# Patient Record
Sex: Male | Born: 1945 | Race: White | Hispanic: No | Marital: Single | Smoking: Never smoker
Health system: Southern US, Community
[De-identification: ages and names within clinical notes are randomized; demographics above are authoritative.]

## PROBLEM LIST (undated history)

## (undated) DIAGNOSIS — E785 Hyperlipidemia, unspecified: Secondary | ICD-10-CM

## (undated) DIAGNOSIS — I10 Essential (primary) hypertension: Secondary | ICD-10-CM

## (undated) DIAGNOSIS — R040 Epistaxis: Secondary | ICD-10-CM

## (undated) DIAGNOSIS — M1990 Unspecified osteoarthritis, unspecified site: Secondary | ICD-10-CM

## (undated) DIAGNOSIS — I499 Cardiac arrhythmia, unspecified: Secondary | ICD-10-CM

## (undated) DIAGNOSIS — I482 Chronic atrial fibrillation, unspecified: Secondary | ICD-10-CM

## (undated) DIAGNOSIS — D649 Anemia, unspecified: Secondary | ICD-10-CM

## (undated) HISTORY — DX: Chronic atrial fibrillation, unspecified: I48.20

## (undated) HISTORY — DX: Essential (primary) hypertension: I10

## (undated) HISTORY — PX: SHOULDER SURGERY: SHX246

## (undated) HISTORY — DX: Hyperlipidemia, unspecified: E78.5

## (undated) HISTORY — PX: CARPAL TUNNEL RELEASE: SHX101

## (undated) HISTORY — DX: Epistaxis: R04.0

---

## 1989-12-02 HISTORY — PX: ACOUSTIC NEUROMA RESECTION: SHX5713

## 2001-03-10 ENCOUNTER — Encounter (HOSPITAL_COMMUNITY): Admission: RE | Admit: 2001-03-10 | Discharge: 2001-04-09 | Payer: Self-pay | Admitting: Orthopedic Surgery

## 2003-01-18 ENCOUNTER — Ambulatory Visit (HOSPITAL_BASED_OUTPATIENT_CLINIC_OR_DEPARTMENT_OTHER): Admission: RE | Admit: 2003-01-18 | Discharge: 2003-01-18 | Payer: Self-pay | Admitting: Orthopedic Surgery

## 2003-03-10 ENCOUNTER — Ambulatory Visit (HOSPITAL_BASED_OUTPATIENT_CLINIC_OR_DEPARTMENT_OTHER): Admission: RE | Admit: 2003-03-10 | Discharge: 2003-03-10 | Payer: Self-pay | Admitting: Orthopedic Surgery

## 2004-03-20 ENCOUNTER — Ambulatory Visit (HOSPITAL_COMMUNITY): Admission: RE | Admit: 2004-03-20 | Discharge: 2004-03-20 | Payer: Self-pay | Admitting: *Deleted

## 2004-10-12 ENCOUNTER — Ambulatory Visit: Payer: Self-pay | Admitting: *Deleted

## 2004-11-08 ENCOUNTER — Encounter (HOSPITAL_COMMUNITY): Admission: RE | Admit: 2004-11-08 | Discharge: 2004-12-08 | Payer: Self-pay | Admitting: Orthopedic Surgery

## 2004-11-14 ENCOUNTER — Ambulatory Visit: Payer: Self-pay | Admitting: *Deleted

## 2004-11-21 ENCOUNTER — Ambulatory Visit: Payer: Self-pay | Admitting: Cardiology

## 2004-12-19 ENCOUNTER — Ambulatory Visit: Payer: Self-pay | Admitting: *Deleted

## 2005-01-16 ENCOUNTER — Ambulatory Visit: Payer: Self-pay | Admitting: *Deleted

## 2005-02-13 ENCOUNTER — Ambulatory Visit: Payer: Self-pay | Admitting: Cardiovascular Disease

## 2005-03-19 ENCOUNTER — Ambulatory Visit: Payer: Self-pay | Admitting: *Deleted

## 2005-04-17 ENCOUNTER — Ambulatory Visit: Payer: Self-pay | Admitting: *Deleted

## 2005-05-17 ENCOUNTER — Ambulatory Visit: Payer: Self-pay | Admitting: *Deleted

## 2005-06-26 ENCOUNTER — Ambulatory Visit: Payer: Self-pay | Admitting: *Deleted

## 2005-07-29 ENCOUNTER — Ambulatory Visit: Payer: Self-pay | Admitting: Cardiology

## 2005-08-26 ENCOUNTER — Ambulatory Visit: Payer: Self-pay | Admitting: Cardiology

## 2005-09-23 ENCOUNTER — Ambulatory Visit: Payer: Self-pay | Admitting: *Deleted

## 2005-10-28 ENCOUNTER — Ambulatory Visit: Payer: Self-pay | Admitting: *Deleted

## 2005-11-27 ENCOUNTER — Ambulatory Visit: Payer: Self-pay | Admitting: Cardiology

## 2005-12-30 ENCOUNTER — Ambulatory Visit: Payer: Self-pay | Admitting: *Deleted

## 2006-01-28 ENCOUNTER — Ambulatory Visit: Payer: Self-pay | Admitting: Cardiology

## 2006-02-27 ENCOUNTER — Ambulatory Visit: Payer: Self-pay | Admitting: *Deleted

## 2006-04-01 ENCOUNTER — Ambulatory Visit: Payer: Self-pay | Admitting: *Deleted

## 2006-04-30 ENCOUNTER — Ambulatory Visit: Payer: Self-pay | Admitting: *Deleted

## 2006-05-30 ENCOUNTER — Ambulatory Visit: Payer: Self-pay | Admitting: *Deleted

## 2006-06-30 ENCOUNTER — Ambulatory Visit: Payer: Self-pay | Admitting: *Deleted

## 2006-07-28 ENCOUNTER — Ambulatory Visit: Payer: Self-pay | Admitting: *Deleted

## 2006-09-01 ENCOUNTER — Ambulatory Visit: Payer: Self-pay | Admitting: Cardiology

## 2006-10-03 ENCOUNTER — Ambulatory Visit: Payer: Self-pay | Admitting: Cardiovascular Disease

## 2006-10-30 ENCOUNTER — Ambulatory Visit: Payer: Self-pay | Admitting: Cardiology

## 2006-11-26 ENCOUNTER — Ambulatory Visit: Payer: Self-pay | Admitting: Cardiology

## 2006-12-30 ENCOUNTER — Ambulatory Visit: Payer: Self-pay | Admitting: Cardiology

## 2007-02-02 ENCOUNTER — Ambulatory Visit: Payer: Self-pay | Admitting: Cardiology

## 2007-02-02 ENCOUNTER — Encounter (INDEPENDENT_AMBULATORY_CARE_PROVIDER_SITE_OTHER): Payer: Self-pay | Admitting: *Deleted

## 2007-02-02 LAB — CONVERTED CEMR LAB
Albumin: 4.3 g/dL
Alkaline Phosphatase: 132 units/L
BUN: 23 mg/dL
CO2: 27 meq/L
Calcium: 9.3 mg/dL
Creatinine, Ser: 0.97 mg/dL
Glucose, Bld: 92 mg/dL
Total Protein: 6.6 g/dL

## 2007-02-19 ENCOUNTER — Ambulatory Visit: Payer: Self-pay | Admitting: Internal Medicine

## 2007-03-27 ENCOUNTER — Ambulatory Visit: Payer: Self-pay | Admitting: Internal Medicine

## 2007-04-28 ENCOUNTER — Ambulatory Visit: Payer: Self-pay | Admitting: Cardiology

## 2007-06-01 ENCOUNTER — Ambulatory Visit: Payer: Self-pay | Admitting: Internal Medicine

## 2007-07-17 ENCOUNTER — Ambulatory Visit: Payer: Self-pay | Admitting: Internal Medicine

## 2007-08-14 ENCOUNTER — Ambulatory Visit: Payer: Self-pay | Admitting: Cardiology

## 2007-09-11 ENCOUNTER — Ambulatory Visit: Payer: Self-pay | Admitting: Internal Medicine

## 2007-10-12 ENCOUNTER — Ambulatory Visit: Payer: Self-pay | Admitting: Cardiology

## 2007-11-10 ENCOUNTER — Ambulatory Visit: Payer: Self-pay | Admitting: Cardiology

## 2007-12-04 ENCOUNTER — Ambulatory Visit: Payer: Self-pay | Admitting: Cardiology

## 2007-12-07 ENCOUNTER — Ambulatory Visit: Payer: Self-pay | Admitting: Cardiology

## 2008-01-04 ENCOUNTER — Ambulatory Visit: Payer: Self-pay | Admitting: Cardiology

## 2008-02-03 ENCOUNTER — Ambulatory Visit: Payer: Self-pay | Admitting: Cardiology

## 2008-03-04 ENCOUNTER — Ambulatory Visit: Payer: Self-pay | Admitting: Cardiology

## 2008-04-08 ENCOUNTER — Ambulatory Visit: Payer: Self-pay | Admitting: Internal Medicine

## 2008-05-09 ENCOUNTER — Ambulatory Visit: Payer: Self-pay | Admitting: Cardiology

## 2008-05-30 ENCOUNTER — Ambulatory Visit: Payer: Self-pay | Admitting: Internal Medicine

## 2008-06-27 ENCOUNTER — Ambulatory Visit: Payer: Self-pay | Admitting: Cardiology

## 2008-07-27 ENCOUNTER — Ambulatory Visit: Payer: Self-pay | Admitting: Cardiology

## 2008-08-17 ENCOUNTER — Ambulatory Visit: Payer: Self-pay | Admitting: Cardiology

## 2008-09-22 ENCOUNTER — Ambulatory Visit: Payer: Self-pay | Admitting: Cardiology

## 2008-10-24 ENCOUNTER — Ambulatory Visit: Payer: Self-pay | Admitting: Cardiology

## 2008-11-21 ENCOUNTER — Ambulatory Visit: Payer: Self-pay | Admitting: Cardiology

## 2008-12-19 ENCOUNTER — Ambulatory Visit: Payer: Self-pay | Admitting: Cardiology

## 2009-01-16 ENCOUNTER — Ambulatory Visit: Payer: Self-pay | Admitting: Cardiology

## 2009-02-13 ENCOUNTER — Ambulatory Visit: Payer: Self-pay | Admitting: Cardiology

## 2009-03-13 ENCOUNTER — Ambulatory Visit: Payer: Self-pay | Admitting: Cardiology

## 2009-04-17 ENCOUNTER — Ambulatory Visit: Payer: Self-pay | Admitting: Cardiology

## 2009-05-22 ENCOUNTER — Ambulatory Visit: Payer: Self-pay | Admitting: Cardiology

## 2009-06-22 ENCOUNTER — Ambulatory Visit: Payer: Self-pay | Admitting: Cardiology

## 2009-07-17 ENCOUNTER — Encounter: Payer: Self-pay | Admitting: *Deleted

## 2009-07-17 ENCOUNTER — Ambulatory Visit: Payer: Self-pay | Admitting: Cardiology

## 2009-07-17 LAB — CONVERTED CEMR LAB: POC INR: 3

## 2009-08-21 ENCOUNTER — Ambulatory Visit: Payer: Self-pay | Admitting: Cardiology

## 2009-09-18 ENCOUNTER — Ambulatory Visit: Payer: Self-pay | Admitting: Cardiology

## 2009-09-18 LAB — CONVERTED CEMR LAB: POC INR: 2.7

## 2009-10-05 ENCOUNTER — Encounter (INDEPENDENT_AMBULATORY_CARE_PROVIDER_SITE_OTHER): Payer: Self-pay

## 2009-10-16 ENCOUNTER — Ambulatory Visit: Payer: Self-pay | Admitting: Cardiology

## 2009-10-16 LAB — CONVERTED CEMR LAB: POC INR: 3.1

## 2009-11-23 ENCOUNTER — Ambulatory Visit: Payer: Self-pay | Admitting: Cardiology

## 2009-12-20 ENCOUNTER — Encounter (INDEPENDENT_AMBULATORY_CARE_PROVIDER_SITE_OTHER): Payer: Self-pay | Admitting: *Deleted

## 2009-12-26 ENCOUNTER — Ambulatory Visit: Payer: Self-pay | Admitting: Cardiology

## 2009-12-26 ENCOUNTER — Ambulatory Visit (HOSPITAL_COMMUNITY): Admission: RE | Admit: 2009-12-26 | Discharge: 2009-12-26 | Payer: Self-pay | Admitting: Cardiology

## 2009-12-26 ENCOUNTER — Encounter (INDEPENDENT_AMBULATORY_CARE_PROVIDER_SITE_OTHER): Payer: Self-pay | Admitting: *Deleted

## 2009-12-26 DIAGNOSIS — E039 Hypothyroidism, unspecified: Secondary | ICD-10-CM | POA: Insufficient documentation

## 2009-12-26 DIAGNOSIS — I4891 Unspecified atrial fibrillation: Secondary | ICD-10-CM | POA: Insufficient documentation

## 2009-12-26 DIAGNOSIS — E785 Hyperlipidemia, unspecified: Secondary | ICD-10-CM | POA: Insufficient documentation

## 2009-12-26 DIAGNOSIS — M25519 Pain in unspecified shoulder: Secondary | ICD-10-CM

## 2009-12-26 LAB — CONVERTED CEMR LAB: POC INR: 2.6

## 2009-12-27 ENCOUNTER — Encounter: Payer: Self-pay | Admitting: Cardiology

## 2009-12-27 LAB — CONVERTED CEMR LAB
ALT: 24 units/L (ref 0–53)
BUN: 23 mg/dL (ref 6–23)
Basophils Absolute: 0.1 10*3/uL (ref 0.0–0.1)
Basophils Relative: 1 % (ref 0–1)
CO2: 27 meq/L (ref 19–32)
Calcium: 9.2 mg/dL (ref 8.4–10.5)
Glucose, Bld: 100 mg/dL — ABNORMAL HIGH (ref 70–99)
HCT: 44.7 % (ref 39.0–52.0)
MCHC: 34 g/dL (ref 30.0–36.0)
MCV: 88.2 fL (ref 78.0–100.0)
Monocytes Relative: 10 % (ref 3–12)
Neutro Abs: 6.2 10*3/uL (ref 1.7–7.7)
Platelets: 280 10*3/uL (ref 150–400)
RDW: 13.6 % (ref 11.5–15.5)
Sodium: 142 meq/L (ref 135–145)
TSH: 7.55 microintl units/mL — ABNORMAL HIGH (ref 0.350–4.500)
Total Protein: 6.6 g/dL (ref 6.0–8.3)
Triglycerides: 102 mg/dL (ref <150)

## 2009-12-29 ENCOUNTER — Encounter (INDEPENDENT_AMBULATORY_CARE_PROVIDER_SITE_OTHER): Payer: Self-pay | Admitting: *Deleted

## 2009-12-29 LAB — CONVERTED CEMR LAB: OCCULT 1: NEGATIVE

## 2010-01-02 ENCOUNTER — Encounter (INDEPENDENT_AMBULATORY_CARE_PROVIDER_SITE_OTHER): Payer: Self-pay | Admitting: *Deleted

## 2010-01-04 ENCOUNTER — Encounter (INDEPENDENT_AMBULATORY_CARE_PROVIDER_SITE_OTHER): Payer: Self-pay | Admitting: *Deleted

## 2010-01-22 ENCOUNTER — Ambulatory Visit: Payer: Self-pay | Admitting: Cardiology

## 2010-01-22 LAB — CONVERTED CEMR LAB: POC INR: 2.7

## 2010-02-19 ENCOUNTER — Ambulatory Visit: Payer: Self-pay | Admitting: Cardiology

## 2010-02-19 LAB — CONVERTED CEMR LAB: POC INR: 2.4

## 2010-03-19 ENCOUNTER — Ambulatory Visit: Payer: Self-pay | Admitting: Cardiovascular Disease

## 2010-03-19 LAB — CONVERTED CEMR LAB: POC INR: 3

## 2010-04-16 ENCOUNTER — Ambulatory Visit: Payer: Self-pay | Admitting: Cardiology

## 2010-04-16 LAB — CONVERTED CEMR LAB: POC INR: 2

## 2010-05-14 ENCOUNTER — Ambulatory Visit: Payer: Self-pay | Admitting: Cardiology

## 2010-05-14 LAB — CONVERTED CEMR LAB: POC INR: 4.2

## 2010-05-21 ENCOUNTER — Ambulatory Visit: Payer: Self-pay | Admitting: Cardiovascular Disease

## 2010-06-06 ENCOUNTER — Ambulatory Visit: Payer: Self-pay | Admitting: Cardiovascular Disease

## 2010-06-06 LAB — CONVERTED CEMR LAB: POC INR: 2

## 2010-07-04 ENCOUNTER — Ambulatory Visit: Payer: Self-pay | Admitting: Cardiology

## 2010-08-01 ENCOUNTER — Ambulatory Visit: Payer: Self-pay | Admitting: Cardiology

## 2010-08-01 LAB — CONVERTED CEMR LAB: POC INR: 3.7

## 2010-08-22 ENCOUNTER — Ambulatory Visit: Payer: Self-pay | Admitting: Cardiology

## 2010-08-22 LAB — CONVERTED CEMR LAB: POC INR: 2.6

## 2010-09-18 ENCOUNTER — Ambulatory Visit: Payer: Self-pay | Admitting: Cardiology

## 2010-09-18 LAB — CONVERTED CEMR LAB: POC INR: 1.8

## 2010-10-01 ENCOUNTER — Ambulatory Visit: Payer: Self-pay | Admitting: Cardiology

## 2010-10-01 LAB — CONVERTED CEMR LAB: POC INR: 2.5

## 2010-10-29 ENCOUNTER — Ambulatory Visit: Payer: Self-pay | Admitting: Cardiology

## 2010-10-29 LAB — CONVERTED CEMR LAB: POC INR: 3

## 2010-11-21 ENCOUNTER — Ambulatory Visit: Payer: Self-pay | Admitting: Cardiology

## 2010-11-21 ENCOUNTER — Encounter: Payer: Self-pay | Admitting: Cardiology

## 2010-12-10 ENCOUNTER — Telehealth (INDEPENDENT_AMBULATORY_CARE_PROVIDER_SITE_OTHER): Payer: Self-pay | Admitting: *Deleted

## 2010-12-19 ENCOUNTER — Ambulatory Visit: Admit: 2010-12-19 | Payer: Self-pay

## 2011-01-01 NOTE — Medication Information (Signed)
Summary: 2 wk protime per checkout on 09/18/10/tg  Anticoagulant Therapy  Managed by: Vashti Hey, RN PCP: Wallace Going Supervising MD: Diona Browner MD, Remi Deter Indication 1: Atrial Fibrillation (ICD-427.31) Lab Used: [facility] [place] Site: [place] INR POC 2.5  Dietary changes: no    Health status changes: no    Bleeding/hemorrhagic complications: no    Recent/future hospitalizations: no    Any changes in medication regimen? no    Recent/future dental: no  Any missed doses?: no       Is patient compliant with meds? yes       Allergies: No Known Drug Allergies  Anticoagulation Management History:      The patient is taking warfarin and comes in today for a routine follow up visit.  Negative risk factors for bleeding include an age less than 77 years old.  The bleeding index is 'low risk'.  Positive CHADS2 values include History of HTN.  Negative CHADS2 values include Age > 1 years old.  The start date was 09/22/2000.  Anticoagulation responsible provider: Diona Browner MD, Remi Deter.  INR POC: 2.5.  Cuvette Lot#: [id number].  Exp: 10/2011.    Anticoagulation Management Assessment/Plan:      The patient's current anticoagulation dose is Warfarin sodium 5 mg tabs: Take 1 tablet by mouth once daily as directed by coumadin clinic.  The target INR is 2 - 3.  The next INR is due 10/29/2010.  Anticoagulation instructions were given to patient.  Results were reviewed/authorized by Vashti Hey, RN.  He was notified by Vashti Hey RN.         Prior Anticoagulation Instructions: take an extra 0.5 tomorrow then resume current dose of 1.5 tablets on Tuesdays, Thursdays, Saturdays and Sundays 1 tablet all other days  Current Anticoagulation Instructions: INR 2.5 Continue coumadin 7.5mg  once daily except 5mg  on Mondays, Wednesdays and Fridays

## 2011-01-01 NOTE — Medication Information (Signed)
Summary: ccr-lr  Anticoagulant Therapy  Managed by: Vashti Hey, RN PCP: Wallace Going Supervising MD: Dietrich Pates MD, Molly Maduro Indication 1: Atrial Fibrillation (ICD-427.31) Lab Used: [facility] [place] Site: [place] INR POC 4.2  Dietary changes: no    Health status changes: no    Bleeding/hemorrhagic complications: no    Recent/future hospitalizations: no    Any changes in medication regimen? no    Recent/future dental: no  Any missed doses?: no       Is patient compliant with meds? yes       Allergies: No Known Drug Allergies  Anticoagulation Management History:      The patient is taking warfarin and comes in today for a routine follow up visit.  Negative risk factors for bleeding include an age less than 68 years old.  The bleeding index is 'low risk'.  Positive CHADS2 values include History of HTN.  Negative CHADS2 values include Age > 19 years old.  The start date was 09/22/2000.  Anticoagulation responsible provider: Dietrich Pates MD, Molly Maduro.  INR POC: 4.2.  Cuvette Lot#: [id number].  Exp: 03/2011.    Anticoagulation Management Assessment/Plan:      The patient's current anticoagulation dose is Warfarin sodium 5 mg tabs: Take 1 tablet by mouth once daily as directed by coumadin clinic.  The target INR is 2 - 3.  The next INR is due 05/21/2010.  Anticoagulation instructions were given to patient.  Results were reviewed/authorized by Vashti Hey, RN.  He was notified by Vashti Hey RN.         Prior Anticoagulation Instructions: INR 2.0 Continue coumadin 7.5mg  once daily except 5mg  on Wednesdays  Current Anticoagulation Instructions: INR 4.2 Hold coumadin tonight, take 2.5mg  tomorrow night then resume 7.5mg  once daily except 5mg  on Wednesdays

## 2011-01-01 NOTE — Medication Information (Signed)
Summary: rov/sp  Anticoagulant Therapy  Managed by: Vashti Hey, RN PCP: Wallace Going Supervising MD: Eden Emms MD, Theron Arista Indication 1: Atrial Fibrillation (ICD-427.31) Lab Used: [facility] [place] Site: [place] INR POC 2.0  Dietary changes: no    Health status changes: no    Bleeding/hemorrhagic complications: no    Recent/future hospitalizations: no    Any changes in medication regimen? no    Recent/future dental: no  Any missed doses?: no       Is patient compliant with meds? yes       Allergies: No Known Drug Allergies  Anticoagulation Management History:      The patient is taking warfarin and comes in today for a routine follow up visit.  Negative risk factors for bleeding include an age less than 8 years old.  The bleeding index is 'low risk'.  Positive CHADS2 values include History of HTN.  Negative CHADS2 values include Age > 58 years old.  The start date was 09/22/2000.  Anticoagulation responsible provider: Eden Emms MD, Theron Arista.  INR POC: 2.0.  Cuvette Lot#: [id number].  Exp: 03/2011.    Anticoagulation Management Assessment/Plan:      The patient's current anticoagulation dose is Warfarin sodium 5 mg tabs: Take 1 tablet by mouth once daily as directed by coumadin clinic.  The target INR is 2 - 3.  The next INR is due 07/05/2010.  Anticoagulation instructions were given to patient.  Results were reviewed/authorized by Vashti Hey, RN.  He was notified by Vashti Hey RN.         Prior Anticoagulation Instructions: INR 2.4  Continue same dose of 1 1/2 tablets every day except 1 tablet on Wednesday.   Current Anticoagulation Instructions: INR 2.0 Take coumadin 1 1/2 tablets tonight then resume 1 1/2 tablets once daily except 1 tablet on Wednesdays

## 2011-01-01 NOTE — Medication Information (Signed)
Summary: ccr-lr  Anticoagulant Therapy  Managed by: Vashti Hey, RN PCP: Wallace Going Supervising MD: Dietrich Pates MD, Molly Maduro Indication 1: Atrial Fibrillation (ICD-427.31) Lab Used: [facility] [place] Site: [place] INR POC 3.7  Dietary changes: no    Health status changes: no    Bleeding/hemorrhagic complications: no    Recent/future hospitalizations: no    Any changes in medication regimen? no    Recent/future dental: no  Any missed doses?: no       Is patient compliant with meds? yes       Allergies: No Known Drug Allergies  Anticoagulation Management History:      The patient is taking warfarin and comes in today for a routine follow up visit.  Negative risk factors for bleeding include an age less than 7 years old.  The bleeding index is 'low risk'.  Positive CHADS2 values include History of HTN.  Negative CHADS2 values include Age > 73 years old.  The start date was 09/22/2000.  Anticoagulation responsible provider: Dietrich Pates MD, Molly Maduro.  INR POC: 3.7.  Cuvette Lot#: [id number].  Exp: 03/2011.    Anticoagulation Management Assessment/Plan:      The patient's current anticoagulation dose is Warfarin sodium 5 mg tabs: Take 1 tablet by mouth once daily as directed by coumadin clinic.  The target INR is 2 - 3.  The next INR is due 08/22/2010.  Anticoagulation instructions were given to patient.  Results were reviewed/authorized by Vashti Hey, RN.  He was notified by Vashti Hey RN.         Prior Anticoagulation Instructions: INR 3.5 Hold coumadin tonight then resume 7.5mg  once daily except 5mg  on Wednesdays  Current Anticoagulation Instructions: INR 3.7 Hold coumadin tonight then decrease dose to 7.5mg  once daily except 5mg  on Mondays, Wednesdays and Fridays

## 2011-01-01 NOTE — Letter (Signed)
Summary: Sidney Ace Future Lab Work Letter  [facility]     [place], [place]    Phone:   Fax:      December 26, 2009 MRN: [id number]   SPARSH CALLENS [address] [place], [place]  [address]      YOUR LAB WORK IS DUE   _____________THIS WEEK____________________________  Please go to [facility], located across the street from [facility] on the second floor.  Hours are Monday - Friday 7am until 7:30pm         Saturday 8am until 12noon    _X_  DO NOT EAT OR DRINK AFTER MIDNIGHT EVENING PRIOR TO LABWORK  __ YOUR LABWORK IS NOT FASTING --YOU MAY EAT PRIOR TO LABWORK

## 2011-01-01 NOTE — Letter (Signed)
Summary: [facility] Results Engineer, agricultural at [facility]  [address] [place] [address]   Phone: [phone]  Fax: [phone]      January 02, 2010 MRN: [id number]   William Sandoval [address] [place], [place]  [address]   Dear Mr. Davison,  Your test ordered by Selena Batten has been reviewed by your physician (or physician assistant) and was found to be normal or stable. Your physician (or physician assistant) felt no changes were needed at this time.  ____ Echocardiogram  ____ Cardiac Stress Test  __x__ Lab Work  ____ Peripheral vascular study of arms, legs or neck  __x__ CT scan or X-ray  ____ Lung or Breathing test  ____ Other: No change in medical treatment at this time, Per Dr. Dietrich Pates.   Thank you, Maxime Beckner Allyne Gee RN    Kern Bing, MD, Lenise Arena.C.Gaylord Shih, MD, F.A.C.C Lewayne Bunting, MD, F.A.C.C Nona Dell, MD, F.A.C.C Charlton Haws, MD, Lenise Arena.C.C

## 2011-01-01 NOTE — Medication Information (Signed)
Summary: ccr-lr  Anticoagulant Therapy  Managed by: Vashti Hey, RN PCP: Wallace Going Supervising MD: Dietrich Pates MD, Molly Maduro Indication 1: Atrial Fibrillation (ICD-427.31) Lab Used: [facility] [place] Site: [place] INR POC 2.4  Dietary changes: no    Health status changes: no    Bleeding/hemorrhagic complications: no    Recent/future hospitalizations: no    Any changes in medication regimen? no    Recent/future dental: no  Any missed doses?: no       Is patient compliant with meds? yes       Allergies: No Known Drug Allergies  Anticoagulation Management History:      The patient is taking warfarin and comes in today for a routine follow up visit.  Negative risk factors for bleeding include an age less than 1 years old.  The bleeding index is 'low risk'.  Positive CHADS2 values include History of HTN.  Negative CHADS2 values include Age > 4 years old.  The start date was 09/22/2000.  Anticoagulation responsible provider: Dietrich Pates MD, Molly Maduro.  INR POC: 2.4.  Cuvette Lot#: [id number].  Exp: 03/2011.    Anticoagulation Management Assessment/Plan:      The patient's current anticoagulation dose is Warfarin sodium 5 mg tabs: Take 1 tablet by mouth once daily as directed by coumadin clinic.  The target INR is 2 - 3.  The next INR is due 03/19/2010.  Anticoagulation instructions were given to patient.  Results were reviewed/authorized by Vashti Hey, RN.  He was notified by Vashti Hey RN.         Prior Anticoagulation Instructions: INR 2.7 Continue coumadin 7.5mg  once daily except 5mg  on Wednesdays  Current Anticoagulation Instructions: INR 2.4 Continue coumadin 7.5mg  once daily except 5mg  on Wednesdays

## 2011-01-01 NOTE — Miscellaneous (Signed)
Summary: labs stool cards  Clinical Lists Changes  Observations: Added new observation of HEMOCCULT 3: neg (12/29/2009 15:55) Added new observation of HEMOCCULT 2: neg (12/29/2009 15:55) Added new observation of HEMOCCULT 1: neg (12/29/2009 15:55)

## 2011-01-01 NOTE — Assessment & Plan Note (Signed)
Summary: past due for f/u/tg  Medications Added LEVOTHYROXINE SODIUM 25 MCG TABS (LEVOTHYROXINE SODIUM) take 1 tab daily NORVASC 5 MG TABS (AMLODIPINE BESYLATE) take 1 tab daily SIMVASTATIN 20 MG TABS (SIMVASTATIN) take 1 tab daily      Allergies Added: NKDA  Visit Type:  Follow-up Primary Provider:  Dr.Golding   History of Present Illness: [name] returns to the office for continued assessment and treatment of atrial fibrillation as well as cardiovascular risk factors in the absence of known coronary artery disease.  Since his last visit, he has done well from a medical standpoint.  No urgent care has been required.  His exercise tolerance is relatively good; he experiences no dyspnea nor chest discomfort.  He has significant social challenges in that he is unemployed and has limited funds.  His principle physical problem has been left shoulder pain for the past few months.  He recalls similar problems with his right shoulder some years ago at which time an orthopaedic surgical procedure was performed with good results by Dr. Rennis Chris.  He describes pain over the tip of the shoulder with certain movements or when he turns over in bed and puts pressure on the arm.  He notes no change in strength or sensation.  Current Medications (verified): 1)  Warfarin Sodium 5 Mg Tabs (Warfarin Sodium) .... Take 1 Tablet By Mouth Once Daily As Directed By Coumadin Clinic 2)  Levothyroxine Sodium 25 Mcg Tabs (Levothyroxine Sodium) .... Take 1 Tab Daily 3)  Norvasc 5 Mg Tabs (Amlodipine Besylate) .... Take 1 Tab Daily 4)  Simvastatin 20 Mg Tabs (Simvastatin) .... Take 1 Tab Daily  Allergies (verified): No Known Drug Allergies   Past History:  PMH, FH, and Social History reviewed and updated.  Past Medical History: Chronic atrial fibrillation; mild mitral valve prolapse on echo in 2001; negative stress nuclear in 1996 Hypertension Dyslipidemia Epistaxis  Past Surgical  History: Orthopaedic procedure on right shoulder  Family History: Father died at age 61 due to neoplastic disease Mother has a history of myocardial infarction One sister alive and well  Social History: Employment-loading dock; factory work Tobacco-none Alcohol-none  Vital Signs:  Patient profile:   65 year old male Height:      74 inches Weight:      243 pounds BMI:     31.31 Pulse rate:   88 / minute BP sitting:   140 / 93  (right arm)  Vitals Entered By: Dreama Saa, CNA (December 26, 2009 12:52 PM)  Physical Exam  General:   General-Well developed; no acute distress:   Neck-No JVD; no carotid bruits: Lungs-No tachypnea, no rales; no rhonchi; no wheezes: Cardiovascular-normal PMI; Split S1 and S2: Abdomen-BS normal; soft and non-tender without masses or organomegaly:  Musculoskeletal-No deformities, no cyanosis or clubbing; limitation of movement of left shoulder, worse with active abduction; no point tenderness Neurologic-Normal cranial nerves; symmetric strength and tone:  Skin-Warm, no significant lesions: Extremities-Nl distal pulses; 1+ pretibial edema:     Impression & Recommendations:  Problem # 1:  SHOULDER PAIN, LEFT (ICD-719.41) Patient's current symptoms are compatible with either arthritis of the left shoulder or cervical spine disease.  Lane films will be obtained, and the patient referred back to his orthopaedic physician, Dr. Rennis Chris.  Problem # 2:  ATRIAL FIBRILLATION (ICD-427.31) Control of heart rate is excellent.  Continuing anticoagulation is required.  To monitor this, a CBC and stool for Hemoccult testing will be obtained.  Problem # 3:  HYPOTHYROIDISM (ICD-244.9) Thyroid-stimulating  hormone level will be measured.  Problem # 4:  HYPERLIPIDEMIA (ICD-272.4) Fasting lipid profile and complete metabolic profile will be obtained.  Problem # 5:  HYPERTENSION (ICD-401.1) Blood pressure control is good; current prescriptions will be  renewed.  I will reassess this nice gentleman in one year.  Other Orders: Hemoccult Cards (Take Home) (Hemoccult Cards) T-Shoulder Left Min 2 Views ([id number]) T-Cervicle Spine 2-3 Views [id number]) Future Orders: T-Comprehensive Metabolic Panel ([id number]) ... 12/27/2009 T-CBC w/Diff ([id number]) ... 12/27/2009 T-Lipid Profile [id number]) ... 12/27/2009 T-TSH [id number]) ... 12/27/2009  Patient Instructions: 1)  Your physician recommends that you schedule a follow-up appointment in: 1 YEAR 2)  Your physician recommends that you return for lab work in: THIS WEEK 3)  You have been referred to [facility] FOR LEFT SHOULDER PAIN 4)  Your physician has asked that you test your stool for blood. It is necessary to test 3 different stool specimens for accuracy. You will be given 3 hemoccult cards for specimen collection. For each stool specimen, place a small portion of stool sample (from 2 different areas of the stool) into the 2 squares on the card. Close card. Repeat with 2 more stool specimens. Bring the cards back to the office for testing. 5)  LEFT SHOULDER AND CERVICAL SPINE X RAYS TODAY

## 2011-01-01 NOTE — Medication Information (Signed)
Summary: ccr-lr  Anticoagulant Therapy  Managed by: Vashti Hey, RN PCP: Wallace Going Supervising MD: Diona Browner MD, Remi Deter Indication 1: Atrial Fibrillation (ICD-427.31) Lab Used: [facility] [place] Site: [place] INR POC 2.6  Dietary changes: no    Health status changes: no    Bleeding/hemorrhagic complications: no    Recent/future hospitalizations: no    Any changes in medication regimen? no    Recent/future dental: no  Any missed doses?: no       Is patient compliant with meds? yes       Allergies: No Known Drug Allergies  Anticoagulation Management History:      The patient is taking warfarin and comes in today for a routine follow up visit.  Negative risk factors for bleeding include an age less than 53 years old.  The bleeding index is 'low risk'.  Positive CHADS2 values include History of HTN.  Negative CHADS2 values include Age > 41 years old.  The start date was 09/22/2000.  Anticoagulation responsible provider: Diona Browner MD, Remi Deter.  INR POC: 2.6.  Cuvette Lot#: [id number].  Exp: 03/2011.    Anticoagulation Management Assessment/Plan:      The patient's current anticoagulation dose is Warfarin sodium 5 mg tabs: Take 1 tablet by mouth once daily as directed by coumadin clinic.  The target INR is 2 - 3.  The next INR is due 09/19/2010.  Anticoagulation instructions were given to patient.  Results were reviewed/authorized by Vashti Hey, RN.  He was notified by Vashti Hey RN.         Prior Anticoagulation Instructions: INR 3.7 Hold coumadin tonight then decrease dose to 7.5mg  once daily except 5mg  on Mondays, Wednesdays and Fridays  Current Anticoagulation Instructions: INR 2.6 Continue coumadin 7.5mg  once daily except 5mg  on Mondays, Wednesdays and Fridays

## 2011-01-01 NOTE — Miscellaneous (Signed)
Summary: LABS CMP,02/02/2007  Clinical Lists Changes  Observations: Added new observation of CALCIUM: 9.3 mg/dL (78/29/5621 30:86) Added new observation of ALBUMIN: 4.3 g/dL (57/84/6962 95:28) Added new observation of PROTEIN, TOT: 6.6 g/dL (41/32/4401 02:72) Added new observation of SGPT (ALT): 29 units/L (02/02/2007 15:55) Added new observation of SGOT (AST): 26 units/L (02/02/2007 15:55) Added new observation of ALK PHOS: 132 units/L (02/02/2007 15:55) Added new observation of CREATININE: 0.97 mg/dL (53/66/4403 47:42) Added new observation of BUN: 23 mg/dL (59/56/3875 64:33) Added new observation of BG RANDOM: 92 mg/dL (29/51/8841 66:06) Added new observation of CO2 PLSM/SER: 27 meq/L (02/02/2007 15:55) Added new observation of CL SERUM: 106 meq/L (02/02/2007 15:55) Added new observation of K SERUM: 4.7 meq/L (02/02/2007 15:55) Added new observation of NA: 143 meq/L (02/02/2007 15:55)

## 2011-01-01 NOTE — Medication Information (Signed)
Summary: ccr-lr  Anticoagulant Therapy  Managed by: Vashti Hey, RN PCP: Wallace Going Supervising MD: Diona Browner MD, Remi Deter Indication 1: Atrial Fibrillation (ICD-427.31) Lab Used: [facility] [place] Site: [place] INR POC 3.0  Dietary changes: no    Health status changes: no    Bleeding/hemorrhagic complications: no    Recent/future hospitalizations: no    Any changes in medication regimen? no    Recent/future dental: no  Any missed doses?: no       Is patient compliant with meds? yes       Allergies: No Known Drug Allergies  Anticoagulation Management History:      The patient is taking warfarin and comes in today for a routine follow up visit.  Negative risk factors for bleeding include an age less than 70 years old.  The bleeding index is 'low risk'.  Positive CHADS2 values include History of HTN.  Negative CHADS2 values include Age > 16 years old.  The start date was 09/22/2000.  Anticoagulation responsible provider: Diona Browner MD, Remi Deter.  INR POC: 3.0.  Cuvette Lot#: [id number].  Exp: 10/2011.    Anticoagulation Management Assessment/Plan:      The patient's current anticoagulation dose is Warfarin sodium 5 mg tabs: Take 1 tablet by mouth once daily as directed by coumadin clinic.  The target INR is 2 - 3.  The next INR is due 11/21/2010.  Anticoagulation instructions were given to patient.  Results were reviewed/authorized by Vashti Hey, RN.  He was notified by Vashti Hey RN.         Prior Anticoagulation Instructions: INR 2.5 Continue coumadin 7.5mg  once daily except 5mg  on Mondays, Wednesdays and Fridays  Current Anticoagulation Instructions: INR 3.0 Continue coumadin 7.5mg  once daily except 5mg  on Mondays, Wednesdays and Fridays

## 2011-01-01 NOTE — Medication Information (Signed)
Summary: ccr-lr  Anticoagulant Therapy  Managed by: Vashti Hey, RN PCP: Wallace Going Supervising MD: Diona Browner MD, Remi Deter Indication 1: Atrial Fibrillation (ICD-427.31) Lab Used: [facility] [place] Site: [place] INR POC 3.5  Dietary changes: no    Health status changes: no    Bleeding/hemorrhagic complications: no    Recent/future hospitalizations: no    Any changes in medication regimen? no    Recent/future dental: no  Any missed doses?: no       Is patient compliant with meds? yes       Allergies: No Known Drug Allergies  Anticoagulation Management History:      The patient is taking warfarin and comes in today for a routine follow up visit.  Negative risk factors for bleeding include an age less than 24 years old.  The bleeding index is 'low risk'.  Positive CHADS2 values include History of HTN.  Negative CHADS2 values include Age > 54 years old.  The start date was 09/22/2000.  Anticoagulation responsible provider: Diona Browner MD, Remi Deter.  INR POC: 3.5.  Cuvette Lot#: [id number].  Exp: 03/2011.    Anticoagulation Management Assessment/Plan:      The patient's current anticoagulation dose is Warfarin sodium 5 mg tabs: Take 1 tablet by mouth once daily as directed by coumadin clinic.  The target INR is 2 - 3.  The next INR is due 08/01/2010.  Anticoagulation instructions were given to patient.  Results were reviewed/authorized by Vashti Hey, RN.  He was notified by Vashti Hey RN.         Prior Anticoagulation Instructions: INR 2.0 Take coumadin 1 1/2 tablets tonight then resume 1 1/2 tablets once daily except 1 tablet on Wednesdays  Current Anticoagulation Instructions: INR 3.5 Hold coumadin tonight then resume 7.5mg  once daily except 5mg  on Wednesdays

## 2011-01-01 NOTE — Medication Information (Signed)
Summary: ccr-lr      Allergies Added: NKDA Anticoagulant Therapy  Managed by: Teressa Lower, RN PCP: Wallace Going Supervising MD: Diona Browner MD, Remi Deter Indication 1: Atrial Fibrillation (ICD-427.31) Lab Used: [facility] [place] Site: [place] INR POC 1.8  Dietary changes: no    Health status changes: no    Bleeding/hemorrhagic complications: no    Recent/future hospitalizations: no    Any changes in medication regimen? no    Recent/future dental: no  Any missed doses?: yes     Details: missed 1 dose and ate extra salads this week  Is patient compliant with meds? yes       Current Medications (verified): 1)  Warfarin Sodium 5 Mg Tabs (Warfarin Sodium) .... Take 1 Tablet By Mouth Once Daily As Directed By Coumadin Clinic 2)  Levothyroxine Sodium 25 Mcg Tabs (Levothyroxine Sodium) .... Take 1 Tab Daily 3)  Norvasc 5 Mg Tabs (Amlodipine Besylate) .... Take 1 Tab Daily 4)  Simvastatin 20 Mg Tabs (Simvastatin) .... Take 1 Tab Daily  Allergies (verified): No Known Drug Allergies  Anticoagulation Management History:      The patient is taking warfarin and comes in today for a routine follow up visit.  Negative risk factors for bleeding include an age less than 40 years old.  The bleeding index is 'low risk'.  Positive CHADS2 values include History of HTN.  Negative CHADS2 values include Age > 36 years old.  The start date was 09/22/2000.  Anticoagulation responsible provider: Diona Browner MD, Remi Deter.  INR POC: 1.8.  Cuvette Lot#: [id number].  Exp: 10/2011.    Anticoagulation Management Assessment/Plan:      The patient's current anticoagulation dose is Warfarin sodium 5 mg tabs: Take 1 tablet by mouth once daily as directed by coumadin clinic.  The target INR is 2 - 3.  The next INR is due 10/01/2010.  Anticoagulation instructions were given to patient.  Results were reviewed/authorized by Teressa Lower, RN.  He was notified by Teressa Lower RN.           Prior Anticoagulation Instructions: INR 2.6 Continue coumadin 7.5mg  once daily except 5mg  on Mondays, Wednesdays and Fridays  Current Anticoagulation Instructions: take an extra 0.5 tomorrow then resume current dose of 1.5 tablets on Tuesdays, Thursdays, Saturdays and Sundays 1 tablet all other days

## 2011-01-01 NOTE — Medication Information (Signed)
Summary: [facility]  Anticoagulant Therapy  Managed by: Vashti Hey, RN PCP: Wallace Going Supervising MD: Dietrich Pates MD, Molly Maduro Indication 1: Atrial Fibrillation (ICD-427.31) Lab Used: [facility] [place] Site: [place] INR POC 2.7  Dietary changes: no    Health status changes: no    Bleeding/hemorrhagic complications: no    Recent/future hospitalizations: no    Any changes in medication regimen? no    Recent/future dental: no  Any missed doses?: no       Is patient compliant with meds? yes       Allergies: No Known Drug Allergies  Anticoagulation Management History:      The patient is taking warfarin and comes in today for a routine follow up visit.  Negative risk factors for bleeding include an age less than 28 years old.  The bleeding index is 'low risk'.  Positive CHADS2 values include History of HTN.  Negative CHADS2 values include Age > 80 years old.  The start date was 09/22/2000.  Anticoagulation responsible provider: Dietrich Pates MD, Molly Maduro.  INR POC: 2.7.  Cuvette Lot#: [id number].  Exp: 03/2011.    Anticoagulation Management Assessment/Plan:      The patient's current anticoagulation dose is Warfarin sodium 5 mg tabs: Take 1 tablet by mouth once daily as directed by coumadin clinic.  The target INR is 2 - 3.  The next INR is due 02/19/2010.  Anticoagulation instructions were given to patient.  Results were reviewed/authorized by Vashti Hey, RN.  He was notified by Vashti Hey RN.         Prior Anticoagulation Instructions: INR 2.6 CONTINUE CURRENT REGIMEN 1 TABLET ON THURSDAYS AND 1.5 TABLETS ALL OTHER DAYS  Current Anticoagulation Instructions: INR 2.7 Continue coumadin 7.5mg  once daily except 5mg  on Wednesdays

## 2011-01-01 NOTE — Medication Information (Signed)
Summary: ccr-lr   Anticoagulant Therapy  Managed by: Teressa Lower, RN Supervising MD: Dietrich Pates MD, Molly Maduro Indication 1: Atrial Fibrillation (ICD-427.31) Lab Used: [facility] [place] Site: [place] INR POC 2.6  Dietary changes: no    Health status changes: no    Bleeding/hemorrhagic complications: no    Recent/future hospitalizations: no    Any changes in medication regimen? no    Recent/future dental: no  Any missed doses?: no       Is patient compliant with meds? yes       Anticoagulation Management History:      The patient is taking warfarin and comes in today for a routine follow up visit.  Negative risk factors for bleeding include an age less than 70 years old.  The bleeding index is 'low risk'.  Negative CHADS2 values include Age > 61 years old.  The start date was 09/22/2000.  Anticoagulation responsible provider: Dietrich Pates MD, Molly Maduro.  INR POC: 2.6.  Cuvette Lot#: [id number].  Exp: 03/2011.    Anticoagulation Management Assessment/Plan:      The patient's current anticoagulation dose is Warfarin sodium 5 mg tabs: Take 1 tablet by mouth once daily as directed by coumadin clinic.  The target INR is 2 - 3.  The next INR is due 01/22/2010.  Anticoagulation instructions were given to patient.  Results were reviewed/authorized by Teressa Lower, RN.  He was notified by Teressa Lower RN.         Prior Anticoagulation Instructions: INR 1.9 Take coumadin 2 1/2 tablets tonight then resume 1 1/2 tablets once daily except 1 tablets on Wednesdays  Current Anticoagulation Instructions: INR 2.6 CONTINUE CURRENT REGIMEN 1 TABLET ON THURSDAYS AND 1.5 TABLETS ALL OTHER DAYS

## 2011-01-01 NOTE — Medication Information (Signed)
Summary: ccr-lr  Anticoagulant Therapy  Managed by: Vashti Hey, RN PCP: Wallace Going Supervising MD: Eden Emms MD, Theron Arista Indication 1: Atrial Fibrillation (ICD-427.31) Lab Used: [facility]  Site: [place] INR POC 3.0  Dietary changes: no    Health status changes: no    Bleeding/hemorrhagic complications: no    Recent/future hospitalizations: no    Any changes in medication regimen? no    Recent/future dental: no  Any missed doses?: no       Is patient compliant with meds? yes       Allergies: No Known Drug Allergies  Anticoagulation Management History:      The patient is taking warfarin and comes in today for a routine follow up visit.  Negative risk factors for bleeding include an age less than 13 years old.  The bleeding index is 'low risk'.  Positive CHADS2 values include History of HTN.  Negative CHADS2 values include Age > 24 years old.  The start date was 09/22/2000.  Anticoagulation responsible provider: Eden Emms MD, Theron Arista.  INR POC: 3.0.  Cuvette Lot#: [id number].  Exp: 03/2011.    Anticoagulation Management Assessment/Plan:      The patient's current anticoagulation dose is Warfarin sodium 5 mg tabs: Take 1 tablet by mouth once daily as directed by coumadin clinic.  The target INR is 2 - 3.  The next INR is due 04/16/2010.  Anticoagulation instructions were given to patient.  Results were reviewed/authorized by Vashti Hey, RN.  He was notified by Vashti Hey RN.         Prior Anticoagulation Instructions: INR 2.4 Continue coumadin 7.5mg  once daily except 5mg  on Wednesdays  Current Anticoagulation Instructions: INR 3.0 Continue coumadin 7.5mg  once daily except 5mg  on Wednesdays

## 2011-01-01 NOTE — Medication Information (Signed)
Summary: ccr-lr  Anticoagulant Therapy  Managed by: Vashti Hey, RN PCP: Wallace Going Supervising MD: Dietrich Pates MD, Molly Maduro Indication 1: Atrial Fibrillation (ICD-427.31) Lab Used: [facility] [place] Site: [place] INR POC 2.0  Dietary changes: no    Health status changes: no    Bleeding/hemorrhagic complications: no    Recent/future hospitalizations: no    Any changes in medication regimen? no    Recent/future dental: no  Any missed doses?: no       Is patient compliant with meds? yes       Allergies: No Known Drug Allergies  Anticoagulation Management History:      The patient is taking warfarin and comes in today for a routine follow up visit.  Negative risk factors for bleeding include an age less than 10 years old.  The bleeding index is 'low risk'.  Positive CHADS2 values include History of HTN.  Negative CHADS2 values include Age > 47 years old.  The start date was 09/22/2000.  Anticoagulation responsible provider: Dietrich Pates MD, Molly Maduro.  INR POC: 2.0.  Cuvette Lot#: [id number].  Exp: 03/2011.    Anticoagulation Management Assessment/Plan:      The patient's current anticoagulation dose is Warfarin sodium 5 mg tabs: Take 1 tablet by mouth once daily as directed by coumadin clinic.  The target INR is 2 - 3.  The next INR is due 05/14/2010.  Anticoagulation instructions were given to patient.  Results were reviewed/authorized by Vashti Hey, RN.  He was notified by Vashti Hey RN.         Prior Anticoagulation Instructions: INR 3.0 Continue coumadin 7.5mg  once daily except 5mg  on Wednesdays  Current Anticoagulation Instructions: INR 2.0 Continue coumadin 7.5mg  once daily except 5mg  on Wednesdays

## 2011-01-01 NOTE — Medication Information (Signed)
Summary: ccr-lr  Anticoagulant Therapy  Managed by: Weston Brass, PharmD PCP: Wallace Going Supervising MD: Eden Emms MD, Theron Arista Indication 1: Atrial Fibrillation (ICD-427.31) Lab Used: [facility] [place] Site: [place] INR POC 2.4  Dietary changes: no    Health status changes: no    Bleeding/hemorrhagic complications: no    Recent/future hospitalizations: no    Any changes in medication regimen? no    Recent/future dental: no  Any missed doses?: no       Is patient compliant with meds? yes       Allergies: No Known Drug Allergies  Anticoagulation Management History:      The patient is taking warfarin and comes in today for a routine follow up visit.  Negative risk factors for bleeding include an age less than 31 years old.  The bleeding index is 'low risk'.  Positive CHADS2 values include History of HTN.  Negative CHADS2 values include Age > 28 years old.  The start date was 09/22/2000.  Anticoagulation responsible provider: Eden Emms MD, Theron Arista.  INR POC: 2.4.  Cuvette Lot#: [id number].  Exp: 03/2011.    Anticoagulation Management Assessment/Plan:      The patient's current anticoagulation dose is Warfarin sodium 5 mg tabs: Take 1 tablet by mouth once daily as directed by coumadin clinic.  The target INR is 2 - 3.  The next INR is due 06/06/2010.  Anticoagulation instructions were given to patient.  Results were reviewed/authorized by Weston Brass, PharmD.  He was notified by Weston Brass PharmD.         Prior Anticoagulation Instructions: INR 4.2 Hold coumadin tonight, take 2.5mg  tomorrow night then resume 7.5mg  once daily except 5mg  on Wednesdays  Current Anticoagulation Instructions: INR 2.4  Continue same dose of 1 1/2 tablets every day except 1 tablet on Wednesday.

## 2011-01-03 NOTE — Medication Information (Signed)
Summary: Coumadin Clinic  Anticoagulant Therapy  Managed by: Vashti Hey, RN PCP: Wallace Going Supervising MD: Dietrich Pates MD, Molly Maduro Indication 1: Atrial Fibrillation (ICD-427.31) Lab Used: [facility] [place] Site: [place] INR POC 2.9  Dietary changes: no    Health status changes: no    Bleeding/hemorrhagic complications: no    Recent/future hospitalizations: no    Any changes in medication regimen? no    Recent/future dental: no  Any missed doses?: no       Is patient compliant with meds? yes       Allergies: No Known Drug Allergies  Anticoagulation Management History:      The patient is taking warfarin and comes in today for a routine follow up visit.  Negative risk factors for bleeding include an age less than 26 years old.  The bleeding index is 'low risk'.  Positive CHADS2 values include History of HTN.  Negative CHADS2 values include Age > 50 years old.  The start date was 09/22/2000.  Anticoagulation responsible provider: Dietrich Pates MD, Molly Maduro.  INR POC: 2.9.  Cuvette Lot#: [id number].  Exp: 10/2011.    Anticoagulation Management Assessment/Plan:      The patient's current anticoagulation dose is Warfarin sodium 5 mg tabs: Take 1 tablet by mouth once daily as directed by coumadin clinic.  The target INR is 2 - 3.  The next INR is due 12/19/2010.  Anticoagulation instructions were given to patient.  Results were reviewed/authorized by Vashti Hey, RN.  He was notified by Vashti Hey RN.         Prior Anticoagulation Instructions: INR 3.0 Continue coumadin 7.5mg  once daily except 5mg  on Mondays, Wednesdays and Fridays  Current Anticoagulation Instructions: INR 2.9 Continue coumadin 7.5mg  once daily except 5mg  on Mondays, Wednesdays and Fridays

## 2011-01-10 ENCOUNTER — Encounter: Payer: Self-pay | Admitting: Cardiology

## 2011-01-14 ENCOUNTER — Telehealth (INDEPENDENT_AMBULATORY_CARE_PROVIDER_SITE_OTHER): Payer: Self-pay | Admitting: *Deleted

## 2011-01-17 NOTE — Progress Notes (Signed)
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 HE STATES HE CANNOT AFFORD TO PAY FOR COUMADIN VISITS AND WILL CALL TO R/S WHEN HE IS ABLE. Initial call taken by: Faythe Ghee,  December 10, 2010 2:48 PM  Follow-up for Phone Call        Pt no longer has the ability to pay co pay for inr check, called his PCP Dr.s Fusco's office and pt is to go to [place] this week for an inr check, this message has been sent to Vashti Hey to inactivate pt in [facility] clinic, 1 month supply of warfarin called in for this pt and all further refills to be sent to PCP Follow-up by: Teressa Lower RN,  January 08, 2011 3:29 PM

## 2011-01-17 NOTE — Medication Information (Signed)
Summary: Coumadin Clinic  Anticoagulant Therapy  Managed by: Inactive PCP: Dr.Golding Supervising MD: Dietrich Pates MD, Molly Maduro Indication 1: Atrial Fibrillation (ICD-427.31) Lab Used: [facility] [place] Site: [place]          Comments: Pt is transfering coumadin management to Dr Sherwood Gambler due to co-pay.  Allergies: No Known Drug Allergies  Anticoagulation Management History:      Negative risk factors for bleeding include an age less than 45 years old.  The bleeding index is 'low risk'.  Positive CHADS2 values include History of HTN.  Negative CHADS2 values include Age > 39 years old.  The start date was 09/22/2000.  Anticoagulation responsible provider: Dietrich Pates MD, Molly Maduro.  Exp: 10/2011.    Anticoagulation Management Assessment/Plan:      The patient's current anticoagulation dose is Warfarin sodium 5 mg tabs: Take 1 tablet by mouth once daily as directed by coumadin clinic.  The target INR is 2 - 3.  The next INR is due 12/19/2010.  Anticoagulation instructions were given to patient.  Results were reviewed/authorized by Inactive.         Prior Anticoagulation Instructions: INR 2.9 Continue coumadin 7.5mg  once daily except 5mg  on Mondays, Wednesdays and Fridays

## 2011-01-23 NOTE — Progress Notes (Signed)
Summary: Coumadin referal to Dr Sherwood Gambler  Phone Note Other Incoming   Summary of Call: Pt request coumadin to be managed by Dr Sherwood Gambler due to co-pay. Referal to Dr Sherwood Gambler for coumadin management. See attached INR records and dosage. Referal given to pt to take to Dr Fusco's office Initial call taken by: Vashti Hey RN,  January 14, 2011 3:52 PM

## 2011-03-14 ENCOUNTER — Encounter: Payer: Self-pay | Admitting: Cardiology

## 2011-03-14 DIAGNOSIS — I4891 Unspecified atrial fibrillation: Secondary | ICD-10-CM

## 2011-03-14 DIAGNOSIS — Z7901 Long term (current) use of anticoagulants: Secondary | ICD-10-CM | POA: Insufficient documentation

## 2011-04-19 NOTE — Op Note (Signed)
   William Sandoval, William Sandoval                           ACCOUNT NO.:  [id number]   MEDICAL RECORD NO.:  [id number]                   PATIENT TYPE:  AMB   LOCATION:  DSC                                  FACILITY:  [facility]   PHYSICIAN:  Katy Fitch. Naaman Plummer., M.D.          DATE OF BIRTH:  [birth year]   DATE OF PROCEDURE:  03/10/2003  DATE OF DISCHARGE:                                 OPERATIVE REPORT   PREOPERATIVE DIAGNOSIS:  Entrapment neuropathy median nerve, left carpal  tunnel.   POSTOPERATIVE DIAGNOSIS:  Entrapment neuropathy median nerve, left carpal  tunnel.   OPERATION:  Release of left transverse carpal ligament.   SURGEON:  Katy Fitch. Sypher, M.D.   ASSISTANT:  Jonni Sanger, P.A.   ANESTHESIA:  General by LMA.   ANESTHESIOLOGIST:  Burna Forts, M.D.   INDICATIONS FOR PROCEDURE:  Octaviano Mukai is a 65 year old man who was  referred for evaluation and management of bilateral hand numbness and  discomfort.  Clinical examination revealed signs of bilateral carpal tunnel  syndrome.  Electrodiagnostic studies completed in November 2003 documented  bilateral carpal tunnel syndrome.  Due to a failure to respond to  nonoperative measures, he is brought to the operating room at this time for  release of his left transverse carpal ligament.   PROCEDURE:  Crixus Mcaulay is brought to the operating room and placed on  supine position on the operating table.  Following induction of general  anesthesia by LMA, the left arm was prepped with Betadine solution and  sterilely draped.  Following exsanguination of the left arm with an Esmarch  bandage, an arterial tourniquet on the proximal brachium was inflated to 220  mmHg.  The procedure commenced with a short incision in the line of the ring  finger in the palm.  The subcutaneous tissues were carefully divided  revealing the palmar fascia.  The common sensory branches of the median  nerve were followed back to the transverse carpal  ligament where the median  nerve was carefully isolated from the ligament.  The ligament was then  released with scissors along the ulnar border extending into the distal  forearm.  This widely opened the carpal canal.  No masses were noted.  Bleeding points along the margins of the released ligament were  electrocauterized with bipolar current followed by repair of the skin with  intradermal 3-0 Prolene suture.  A compressive dressing was applied with a  volar plaster splint with the wrist in 5 degrees dorsiflexion.                                               Katy Fitch Naaman Plummer., M.D.    RVS/MEDQ  D:  03/10/2003  T:  03/10/2003  Job:  [id number]

## 2011-04-19 NOTE — Letter (Signed)
February 02, 2007    Madelin Rear. Sherwood Gambler, MD  [address]  [place], [place] [address]   RE:  William, Sandoval  MRN:  [id number]  /  DOB:  1946-06-09   Dear William Sandoval:   William Sandoval returns to the office for continuing assessment of chronic  atrial fibrillation requiring anticoagulation, hypertension, and  dyslipidemia.  Since last year, he has done superbly.  He previously  experienced a few episodes of epistaxis, but these resolved.  He has not  had any cardiopulmonary symptoms nor required urgent medical care.   MEDICATIONS:  His only medications include:  1. Warfarin as directed with stable and therapeutic anticoagulation.  2. Caduet 5/10 mg daily.   Lipid profile last year was excellent.  Blood pressure control has been  acceptable.   PHYSICAL EXAMINATION:  GENERAL:  Pleasant gentleman in no acute  distress.  VITAL SIGNS: The weight is 204, 8 pounds more than last year.  Heart  rate 68 and irregular, respirations 18, blood pressure 125/85.  NECK:  No jugular venous distention; normal carotid upstrokes without  bruits.  LUNGS:  Clear.  CARDIAC:  Normal first and second heart sounds; modest systolic murmur.  ABDOMEN:  Soft and nontender; no bruits; no masses.  EXTREMITIES:  No edema.   IMPRESSION:  William Sandoval is doing generally well.  We will continue to  manage Warfarin therapy.  A CBC, metabolic profile, and stools for  hemoccult testing will be obtained.  If results are good, I will plan to  see this nice gentleman for a formal office visit again in one year.    Sincerely,      Gerrit Friends. Dietrich Pates, MD, [facility]  Electronically Signed    RMR/MedQ  DD: 02/02/2007  DT: 02/02/2007  Job #: [id number]

## 2011-05-10 ENCOUNTER — Ambulatory Visit: Payer: Self-pay | Admitting: *Deleted

## 2011-11-27 ENCOUNTER — Encounter: Payer: Self-pay | Admitting: Cardiology

## 2011-11-27 DIAGNOSIS — R040 Epistaxis: Secondary | ICD-10-CM | POA: Insufficient documentation

## 2014-12-20 DIAGNOSIS — J209 Acute bronchitis, unspecified: Secondary | ICD-10-CM | POA: Diagnosis not present

## 2014-12-22 DIAGNOSIS — Z7901 Long term (current) use of anticoagulants: Secondary | ICD-10-CM | POA: Diagnosis not present

## 2015-01-11 ENCOUNTER — Ambulatory Visit (HOSPITAL_COMMUNITY)
Admission: RE | Admit: 2015-01-11 | Discharge: 2015-01-11 | Disposition: A | Discharge status: Home / Self Care | Payer: Commercial Managed Care - HMO | Source: Ambulatory Visit | Attending: Internal Medicine | Admitting: Internal Medicine

## 2015-01-11 ENCOUNTER — Other Ambulatory Visit (HOSPITAL_COMMUNITY): Payer: Self-pay | Admitting: Internal Medicine

## 2015-01-11 DIAGNOSIS — Z7901 Long term (current) use of anticoagulants: Secondary | ICD-10-CM | POA: Diagnosis not present

## 2015-01-11 DIAGNOSIS — I482 Chronic atrial fibrillation: Secondary | ICD-10-CM | POA: Diagnosis not present

## 2015-01-11 DIAGNOSIS — I517 Cardiomegaly: Secondary | ICD-10-CM | POA: Diagnosis not present

## 2015-01-11 DIAGNOSIS — L409 Psoriasis, unspecified: Secondary | ICD-10-CM | POA: Diagnosis not present

## 2015-01-11 DIAGNOSIS — R0602 Shortness of breath: Secondary | ICD-10-CM | POA: Diagnosis not present

## 2015-01-11 DIAGNOSIS — J209 Acute bronchitis, unspecified: Secondary | ICD-10-CM | POA: Diagnosis not present

## 2015-01-27 DIAGNOSIS — I482 Chronic atrial fibrillation: Secondary | ICD-10-CM | POA: Diagnosis not present

## 2015-01-27 DIAGNOSIS — Z7901 Long term (current) use of anticoagulants: Secondary | ICD-10-CM | POA: Diagnosis not present

## 2015-01-30 DIAGNOSIS — L98429 Non-pressure chronic ulcer of back with unspecified severity: Secondary | ICD-10-CM | POA: Diagnosis not present

## 2015-01-30 DIAGNOSIS — L0100 Impetigo, unspecified: Secondary | ICD-10-CM | POA: Diagnosis not present

## 2015-01-30 DIAGNOSIS — L309 Dermatitis, unspecified: Secondary | ICD-10-CM | POA: Diagnosis not present

## 2015-01-30 DIAGNOSIS — D485 Neoplasm of uncertain behavior of skin: Secondary | ICD-10-CM | POA: Diagnosis not present

## 2015-02-06 DIAGNOSIS — B86 Scabies: Secondary | ICD-10-CM | POA: Diagnosis not present

## 2015-02-06 DIAGNOSIS — L309 Dermatitis, unspecified: Secondary | ICD-10-CM | POA: Diagnosis not present

## 2015-02-06 DIAGNOSIS — Z79899 Other long term (current) drug therapy: Secondary | ICD-10-CM | POA: Diagnosis not present

## 2015-02-06 DIAGNOSIS — R5383 Other fatigue: Secondary | ICD-10-CM | POA: Diagnosis not present

## 2015-02-06 DIAGNOSIS — L299 Pruritus, unspecified: Secondary | ICD-10-CM | POA: Diagnosis not present

## 2015-02-20 DIAGNOSIS — L309 Dermatitis, unspecified: Secondary | ICD-10-CM | POA: Diagnosis not present

## 2015-02-27 DIAGNOSIS — L309 Dermatitis, unspecified: Secondary | ICD-10-CM | POA: Diagnosis not present

## 2015-02-27 DIAGNOSIS — E782 Mixed hyperlipidemia: Secondary | ICD-10-CM | POA: Diagnosis not present

## 2015-02-27 DIAGNOSIS — I482 Chronic atrial fibrillation: Secondary | ICD-10-CM | POA: Diagnosis not present

## 2015-02-27 DIAGNOSIS — I10 Essential (primary) hypertension: Secondary | ICD-10-CM | POA: Diagnosis not present

## 2015-03-14 DIAGNOSIS — I482 Chronic atrial fibrillation: Secondary | ICD-10-CM | POA: Diagnosis not present

## 2015-03-14 DIAGNOSIS — Z7901 Long term (current) use of anticoagulants: Secondary | ICD-10-CM | POA: Diagnosis not present

## 2015-03-28 DIAGNOSIS — Z7901 Long term (current) use of anticoagulants: Secondary | ICD-10-CM | POA: Diagnosis not present

## 2015-03-28 DIAGNOSIS — I482 Chronic atrial fibrillation: Secondary | ICD-10-CM | POA: Diagnosis not present

## 2015-03-28 DIAGNOSIS — E039 Hypothyroidism, unspecified: Secondary | ICD-10-CM | POA: Diagnosis not present

## 2015-04-03 DIAGNOSIS — I482 Chronic atrial fibrillation: Secondary | ICD-10-CM | POA: Diagnosis not present

## 2015-04-03 DIAGNOSIS — Z7901 Long term (current) use of anticoagulants: Secondary | ICD-10-CM | POA: Diagnosis not present

## 2015-04-25 DIAGNOSIS — I482 Chronic atrial fibrillation: Secondary | ICD-10-CM | POA: Diagnosis not present

## 2015-04-25 DIAGNOSIS — Z7901 Long term (current) use of anticoagulants: Secondary | ICD-10-CM | POA: Diagnosis not present

## 2015-04-25 DIAGNOSIS — L309 Dermatitis, unspecified: Secondary | ICD-10-CM | POA: Diagnosis not present

## 2015-04-28 DIAGNOSIS — I482 Chronic atrial fibrillation: Secondary | ICD-10-CM | POA: Diagnosis not present

## 2015-04-28 DIAGNOSIS — Z7901 Long term (current) use of anticoagulants: Secondary | ICD-10-CM | POA: Diagnosis not present

## 2015-05-09 DIAGNOSIS — I482 Chronic atrial fibrillation: Secondary | ICD-10-CM | POA: Diagnosis not present

## 2015-05-30 DIAGNOSIS — L989 Disorder of the skin and subcutaneous tissue, unspecified: Secondary | ICD-10-CM | POA: Diagnosis not present

## 2015-05-30 DIAGNOSIS — L0100 Impetigo, unspecified: Secondary | ICD-10-CM | POA: Diagnosis not present

## 2015-05-30 DIAGNOSIS — D485 Neoplasm of uncertain behavior of skin: Secondary | ICD-10-CM | POA: Diagnosis not present

## 2015-05-30 DIAGNOSIS — L309 Dermatitis, unspecified: Secondary | ICD-10-CM | POA: Diagnosis not present

## 2015-06-06 DIAGNOSIS — I482 Chronic atrial fibrillation: Secondary | ICD-10-CM | POA: Diagnosis not present

## 2015-06-07 DIAGNOSIS — L0100 Impetigo, unspecified: Secondary | ICD-10-CM | POA: Diagnosis not present

## 2015-06-07 DIAGNOSIS — L309 Dermatitis, unspecified: Secondary | ICD-10-CM | POA: Diagnosis not present

## 2015-06-22 DIAGNOSIS — L309 Dermatitis, unspecified: Secondary | ICD-10-CM | POA: Diagnosis not present

## 2015-06-22 DIAGNOSIS — L0100 Impetigo, unspecified: Secondary | ICD-10-CM | POA: Diagnosis not present

## 2015-07-12 DIAGNOSIS — I10 Essential (primary) hypertension: Secondary | ICD-10-CM | POA: Diagnosis not present

## 2015-07-12 DIAGNOSIS — L239 Allergic contact dermatitis, unspecified cause: Secondary | ICD-10-CM | POA: Diagnosis not present

## 2015-07-13 DIAGNOSIS — E782 Mixed hyperlipidemia: Secondary | ICD-10-CM | POA: Diagnosis not present

## 2015-07-13 DIAGNOSIS — I10 Essential (primary) hypertension: Secondary | ICD-10-CM | POA: Diagnosis not present

## 2015-07-13 DIAGNOSIS — L259 Unspecified contact dermatitis, unspecified cause: Secondary | ICD-10-CM | POA: Diagnosis not present

## 2015-07-21 DIAGNOSIS — Z7901 Long term (current) use of anticoagulants: Secondary | ICD-10-CM | POA: Diagnosis not present

## 2015-07-21 DIAGNOSIS — I10 Essential (primary) hypertension: Secondary | ICD-10-CM | POA: Diagnosis not present

## 2015-07-21 DIAGNOSIS — L309 Dermatitis, unspecified: Secondary | ICD-10-CM | POA: Diagnosis not present

## 2015-07-21 DIAGNOSIS — E782 Mixed hyperlipidemia: Secondary | ICD-10-CM | POA: Diagnosis not present

## 2015-07-21 DIAGNOSIS — I482 Chronic atrial fibrillation: Secondary | ICD-10-CM | POA: Diagnosis not present

## 2015-08-08 DIAGNOSIS — L259 Unspecified contact dermatitis, unspecified cause: Secondary | ICD-10-CM | POA: Diagnosis not present

## 2015-08-08 DIAGNOSIS — J309 Allergic rhinitis, unspecified: Secondary | ICD-10-CM | POA: Diagnosis not present

## 2015-08-08 DIAGNOSIS — R05 Cough: Secondary | ICD-10-CM | POA: Diagnosis not present

## 2015-08-15 ENCOUNTER — Other Ambulatory Visit (HOSPITAL_COMMUNITY): Payer: Self-pay | Admitting: Internal Medicine

## 2015-08-15 ENCOUNTER — Ambulatory Visit (HOSPITAL_COMMUNITY)
Admission: RE | Admit: 2015-08-15 | Discharge: 2015-08-15 | Disposition: A | Discharge status: Home / Self Care | Payer: Commercial Managed Care - HMO | Source: Ambulatory Visit | Attending: Internal Medicine | Admitting: Internal Medicine

## 2015-08-15 DIAGNOSIS — R1032 Left lower quadrant pain: Secondary | ICD-10-CM | POA: Insufficient documentation

## 2015-08-15 DIAGNOSIS — R110 Nausea: Secondary | ICD-10-CM

## 2015-08-15 DIAGNOSIS — R109 Unspecified abdominal pain: Secondary | ICD-10-CM | POA: Diagnosis not present

## 2015-08-26 ENCOUNTER — Encounter (HOSPITAL_COMMUNITY): Payer: Self-pay | Admitting: *Deleted

## 2015-08-26 ENCOUNTER — Emergency Department (HOSPITAL_COMMUNITY)
Admission: EM | Admit: 2015-08-26 | Discharge: 2015-08-26 | Disposition: A | Discharge status: Home / Self Care | Payer: Commercial Managed Care - HMO | Attending: Emergency Medicine | Admitting: Emergency Medicine

## 2015-08-26 DIAGNOSIS — Z7951 Long term (current) use of inhaled steroids: Secondary | ICD-10-CM | POA: Insufficient documentation

## 2015-08-26 DIAGNOSIS — Z79899 Other long term (current) drug therapy: Secondary | ICD-10-CM | POA: Diagnosis not present

## 2015-08-26 DIAGNOSIS — I482 Chronic atrial fibrillation: Secondary | ICD-10-CM | POA: Insufficient documentation

## 2015-08-26 DIAGNOSIS — R21 Rash and other nonspecific skin eruption: Secondary | ICD-10-CM | POA: Diagnosis present

## 2015-08-26 DIAGNOSIS — I10 Essential (primary) hypertension: Secondary | ICD-10-CM | POA: Diagnosis not present

## 2015-08-26 DIAGNOSIS — E785 Hyperlipidemia, unspecified: Secondary | ICD-10-CM | POA: Insufficient documentation

## 2015-08-26 DIAGNOSIS — L309 Dermatitis, unspecified: Secondary | ICD-10-CM | POA: Diagnosis not present

## 2015-08-26 DIAGNOSIS — Z7952 Long term (current) use of systemic steroids: Secondary | ICD-10-CM | POA: Insufficient documentation

## 2015-08-26 DIAGNOSIS — Z7901 Long term (current) use of anticoagulants: Secondary | ICD-10-CM | POA: Insufficient documentation

## 2015-08-26 LAB — CBC WITH DIFFERENTIAL/PLATELET
Basophils Absolute: 0 10*3/uL (ref 0.0–0.1)
Basophils Relative: 1 %
Eosinophils Absolute: 0.8 10*3/uL — ABNORMAL HIGH (ref 0.0–0.7)
Eosinophils Relative: 9 %
HEMATOCRIT: 40.8 % (ref 39.0–52.0)
HEMOGLOBIN: 13.8 g/dL (ref 13.0–17.0)
LYMPHS ABS: 1 10*3/uL (ref 0.7–4.0)
LYMPHS PCT: 12 %
MCH: 29.9 pg (ref 26.0–34.0)
MCHC: 33.8 g/dL (ref 30.0–36.0)
MCV: 88.5 fL (ref 78.0–100.0)
MONOS PCT: 13 %
Monocytes Absolute: 1.1 10*3/uL — ABNORMAL HIGH (ref 0.1–1.0)
NEUTROS ABS: 5.9 10*3/uL (ref 1.7–7.7)
NEUTROS PCT: 65 %
Platelets: 287 10*3/uL (ref 150–400)
RBC: 4.61 MIL/uL (ref 4.22–5.81)
RDW: 13.4 % (ref 11.5–15.5)
WBC: 8.8 10*3/uL (ref 4.0–10.5)

## 2015-08-26 LAB — COMPREHENSIVE METABOLIC PANEL
ALBUMIN: 3.5 g/dL (ref 3.5–5.0)
ALK PHOS: 119 U/L (ref 38–126)
ALT: 38 U/L (ref 17–63)
ANION GAP: 7 (ref 5–15)
AST: 38 U/L (ref 15–41)
BILIRUBIN TOTAL: 0.5 mg/dL (ref 0.3–1.2)
BUN: 18 mg/dL (ref 6–20)
CALCIUM: 8.1 mg/dL — AB (ref 8.9–10.3)
CO2: 27 mmol/L (ref 22–32)
Chloride: 104 mmol/L (ref 101–111)
Creatinine, Ser: 1.12 mg/dL (ref 0.61–1.24)
GLUCOSE: 104 mg/dL — AB (ref 65–99)
Potassium: 3.1 mmol/L — ABNORMAL LOW (ref 3.5–5.1)
Sodium: 138 mmol/L (ref 135–145)
TOTAL PROTEIN: 6.1 g/dL — AB (ref 6.5–8.1)

## 2015-08-26 LAB — SEDIMENTATION RATE: SED RATE: 5 mm/h (ref 0–16)

## 2015-08-26 MED ORDER — POTASSIUM CHLORIDE CRYS ER 20 MEQ PO TBCR
40.0000 meq | EXTENDED_RELEASE_TABLET | Freq: Once | ORAL | Status: AC
  Administered 2015-08-26: 40 meq via ORAL
  Filled 2015-08-26: qty 2

## 2015-08-26 MED ORDER — CEPHALEXIN 500 MG PO CAPS: 500.0000 mg | ORAL_CAPSULE | Freq: Four times a day (QID) | ORAL | Status: DC

## 2015-08-26 MED ORDER — METHYLPREDNISOLONE SODIUM SUCC 125 MG IJ SOLR
125.0000 mg | Freq: Once | INTRAMUSCULAR | Status: AC
  Administered 2015-08-26: 125 mg via INTRAVENOUS
  Filled 2015-08-26: qty 2

## 2015-08-26 MED ORDER — SODIUM CHLORIDE 0.9 % IV BOLUS (SEPSIS)
500.0000 mL | Freq: Once | INTRAVENOUS | Status: AC
  Administered 2015-08-26: 500 mL via INTRAVENOUS

## 2015-08-26 MED ORDER — DIPHENHYDRAMINE HCL 50 MG/ML IJ SOLN
25.0000 mg | Freq: Once | INTRAMUSCULAR | Status: AC
  Administered 2015-08-26: 25 mg via INTRAVENOUS
  Filled 2015-08-26: qty 1

## 2015-08-26 MED ORDER — FAMOTIDINE IN NACL 20-0.9 MG/50ML-% IV SOLN
20.0000 mg | Freq: Once | INTRAVENOUS | Status: AC
  Administered 2015-08-26: 20 mg via INTRAVENOUS
  Filled 2015-08-26: qty 50

## 2015-08-26 MED ORDER — PREDNISONE 50 MG PO TABS: ORAL_TABLET | ORAL | Status: DC

## 2015-08-26 NOTE — ED Notes (Signed)
MD at bedside.Patient states he is itching really bad. Scratching at this time.

## 2015-08-26 NOTE — Discharge Instructions (Signed)
Prescription for prednisone and antibiotic. Continue all your other medications. Follow-up with the dermatologist. Phone number given.

## 2015-08-26 NOTE — ED Notes (Addendum)
Rash all over x 6 mo. Pt has been seen by PCP and dermatologist. Rash from neck down to mid thigh, per pt.  Rash is described as stinging, burning pain with itching at times.Pt states he has also been sob a lot with wheezing and headaches lately also.

## 2015-08-26 NOTE — ED Provider Notes (Signed)
CSN: [id number]     Arrival date & time 08/26/15  1722 History   First MD Initiated Contact with Patient 08/26/15 1736     Chief Complaint  Patient presents with  . Rash     (Consider location/radiation/quality/duration/timing/severity/associated sxs/prior Treatment) HPI..... Patient complains of a pruritic generalized body rash for 6 months. He has tried steroids, antibiotic's, antipuretic medications with minimal relief. He has been seen by his primary care doctor and a dermatologist. No fever, sweats, chills. Rash is burning and stinging.  Past Medical History  Diagnosis Date  . Chronic atrial fibrillation   . Hypertension   . Dyslipidemia   . Epistaxis    Past Surgical History  Procedure Laterality Date  . Shoulder surgery      Right   Family History  Problem Relation Age of Onset  . Heart attack Mother    Social History  Substance Use Topics  . Smoking status: Unknown If Ever Smoked  . Smokeless tobacco: None  . Alcohol Use: No    Review of Systems  All other systems reviewed and are negative.     Allergies  Gluten meal; Shrimp; and Sertraline  Home Medications   Prior to Admission medications   Medication Sig Start Date End Date Taking? Authorizing Provider  Fluocinolone Acetonide 0.01 % OIL Apply 1 application topically 2 (two) times daily as needed (Rash).   Yes Historical Provider, MD  fluticasone (FLOVENT HFA) 220 MCG/ACT inhaler Inhale 2 puffs into the lungs 2 (two) times daily.   Yes Historical Provider, MD  hydrochlorothiazide (HYDRODIURIL) 12.5 MG tablet Take 12.5 mg by mouth daily.   Yes Historical Provider, MD  hydrOXYzine (ATARAX/VISTARIL) 10 MG tablet Take 10 mg by mouth 3 (three) times daily as needed for itching.   Yes Historical Provider, MD  ibuprofen (ADVIL,MOTRIN) 800 MG tablet Take 800 mg by mouth every 8 (eight) hours as needed for mild pain.   Yes Historical Provider, MD  levothyroxine (SYNTHROID, LEVOTHROID) 75 MCG tablet Take 75 mcg by  mouth at bedtime.   Yes Historical Provider, MD  metoprolol (LOPRESSOR) 50 MG tablet Take 50 mg by mouth 2 (two) times daily.   Yes Historical Provider, MD  simvastatin (ZOCOR) 20 MG tablet Take 20 mg by mouth at bedtime.     Yes Historical Provider, MD  warfarin (COUMADIN) 5 MG tablet Take 5 mg by mouth daily at 6 PM.    Yes Historical Provider, MD  cephALEXin (KEFLEX) 500 MG capsule Take 1 capsule (500 mg total) by mouth 4 (four) times daily. 08/26/15   Nat Christen, MD  predniSONE (DELTASONE) 50 MG tablet 1 tablet for 7 days, one half tablet for 7 days, one half tablet every other day for 7 days 08/26/15   Nat Christen, MD   BP 118/75 mmHg  Pulse 73  Temp(Src) 98.5 F (36.9 C) (Oral)  Resp 20  Ht 6\' 2"  (1.88 m)  Wt 235 lb (106.595 kg)  BMI 30.16 kg/m2  SpO2 97% Physical Exam  Constitutional: He is oriented to person, place, and time. He appears well-developed and well-nourished.  HENT:  Head: Normocephalic and atraumatic.  Eyes: Conjunctivae and EOM are normal. Pupils are equal, round, and reactive to light.  Neck: Normal range of motion. Neck supple.  Cardiovascular: Normal rate and regular rhythm.   Pulmonary/Chest: Effort normal and breath sounds normal.  Abdominal: Soft. Bowel sounds are normal.  Musculoskeletal: Normal range of motion.  Neurological: He is alert and oriented to person, place, and time.  Skin:  Diffuse erythematous excoriation  Psychiatric: He has a normal mood and affect. His behavior is normal.  Nursing note and vitals reviewed.   ED Course  Procedures (including critical care time) Labs Review Labs Reviewed  CBC WITH DIFFERENTIAL/PLATELET - Abnormal; Notable for the following:    Monocytes Absolute 1.1 (*)    Eosinophils Absolute 0.8 (*)    All other components within normal limits  COMPREHENSIVE METABOLIC PANEL - Abnormal; Notable for the following:    Potassium 3.1 (*)    Glucose, Bld 104 (*)    Calcium 8.1 (*)    Total Protein 6.1 (*)    All other  components within normal limits  SEDIMENTATION RATE    Imaging Review No results found. I have personally reviewed and evaluated these images and lab results as part of my medical decision-making.   EKG Interpretation None      MDM   Final diagnoses:  Dermatitis    IV Solu-Medrol, Benadryl, Pepcid given in the emergency department. Discharge medications include Keflex 500 mg and prednisone. He will follow-up with a dermatologist in [place].    Nat Christen, MD 08/26/15 2225

## 2015-08-29 ENCOUNTER — Encounter: Payer: Self-pay | Admitting: Allergy and Immunology

## 2015-08-29 ENCOUNTER — Ambulatory Visit (INDEPENDENT_AMBULATORY_CARE_PROVIDER_SITE_OTHER): Payer: Commercial Managed Care - HMO | Admitting: Allergy and Immunology

## 2015-08-29 VITALS — BP 138/78 | HR 82 | Temp 98.0°F | Resp 16 | Ht 73.0 in

## 2015-08-29 DIAGNOSIS — R21 Rash and other nonspecific skin eruption: Secondary | ICD-10-CM

## 2015-08-30 NOTE — Progress Notes (Signed)
  Reviewed patient's ED visit and their plan as documented.  Patient to complete meds as prescribed and make new appointment with Dermatology per ED's  Recommendations.  Keep our scheduled appointment for October and follow-up Primary MD.

## 2015-09-02 DIAGNOSIS — I482 Chronic atrial fibrillation: Secondary | ICD-10-CM | POA: Diagnosis not present

## 2015-09-02 DIAGNOSIS — L309 Dermatitis, unspecified: Secondary | ICD-10-CM | POA: Diagnosis not present

## 2015-09-12 ENCOUNTER — Encounter: Payer: Self-pay | Admitting: Allergy and Immunology

## 2015-09-12 ENCOUNTER — Ambulatory Visit (INDEPENDENT_AMBULATORY_CARE_PROVIDER_SITE_OTHER): Payer: Commercial Managed Care - HMO | Admitting: Allergy and Immunology

## 2015-09-12 VITALS — BP 128/70 | HR 74 | Temp 97.8°F | Resp 18

## 2015-09-12 DIAGNOSIS — L299 Pruritus, unspecified: Secondary | ICD-10-CM

## 2015-09-12 DIAGNOSIS — R21 Rash and other nonspecific skin eruption: Secondary | ICD-10-CM

## 2015-09-12 DIAGNOSIS — L853 Xerosis cutis: Secondary | ICD-10-CM | POA: Diagnosis not present

## 2015-09-12 NOTE — Patient Instructions (Signed)
Take Home Sheet  1. Avoidance:  Trial off wheat, egg and shrimp.   2. Antihistamine: Zyrtec10mg  by mouth twice daily for runny nose or itching.   3.  Discontinue Hydroxyzine.  4.   Begin Trial of Doxepin 10 mg once at bedtime.  5.   Continue 2-4 times daily moisturizing of skin.  6.   Begin trial of Mometasone ointment to rash areas at body twice daily as needed.  7.   Dr. Juel Burrow office will call you with new Dermatology appointment and plan for selected labs.  8.   Follow up Visit: 4  Months or sooner if needed.   Websites that have reliable Patient information: 1. American Academy of Asthma, Allergy, & Immunology: www.aaaai.org 2. Food Allergy Network: www.foodallergy.org 3. Mothers of Asthmatics: www.aanma.org 4. [place]: DiningCalendar.de 5. American College of Allergy, Asthma, & Immunology: https://robertson.info/ or www.acaai.org

## 2015-09-13 MED ORDER — MOMETASONE FUROATE 0.1 % EX OINT: TOPICAL_OINTMENT | Freq: Every day | CUTANEOUS | Status: DC

## 2015-09-13 MED ORDER — DOXEPIN HCL 10 MG PO CAPS: 10.0000 mg | ORAL_CAPSULE | Freq: Every evening | ORAL | Status: DC | PRN

## 2015-09-13 NOTE — Progress Notes (Signed)
FOLLOW UP NOTE  RE: William Sandoval MRN: [id number] DOB: 08/05/1946 [facility] [place], [place] [address] Date of Office Visit: 09/12/2015  Subjective:  William Sandoval is a 69 y.o. male who presents with continued itching and rash.   HPI: William Sandoval reports continued itching despite several management regimes.  Since his last visit and over the last many months, -Dr. Nevada Crane had previously prescribed clobetasol and the dermatologist had prescribed Atarax, Elimite, doxycycline and Prednisone without much improvement.  Since his last visit a trial of Derma-Smoothe oil was not giving lasting benefit.  He notices short-term benefit from [place] but is itching consistent.  He apparently has had short trials off metoprolol and simvastatin without much change.  Dr. Nevada Crane recently discontinued warfarin and now has prescribed Eliquis.  He reports the greatest dry, irritated areas are at his arms across his chest and back bu  rarely his legs.  He had previously mentioned a worsening episode when [place] with his [place], which may have been after eating pancakes last month (which prompted ED visit--where ED prescribed Keflex), but has eaten since without issue.  He does not feel he has worsening difficulty related to meal times.  He reports receiving Flovent prescription in June which uses intermittently for cough --recently using twice a day without any other concerns.  He denies wheeze, difficulty breathing, shortness of breath.  He has been using Vanicream soap and moisturizer which is tolerated. He has had no specific reaction to shrimp, egg or wheat.  Current Medications: 1.  Hydroxyzine 10mg  daily as needed. 2.  DermaSmoothe Oil twice daily. 3.  Zyrtec 10mg  once daily. 4.  Metoprolol, Eliquis, hydrochlorothiazide, Synthroid, Simvastatin, Flovent daily.  Drug Allergies: Allergies  Allergen Reactions  . Gluten  Meal Other (See Comments)    Unknown   . Shrimp [Shellfish Allergy] Other (See Comments)    Unknown   . Sertraline Rash    Objective:   Filed Vitals:   09/12/15 1437  BP: 128/70  Pulse: 74  Temp: 97.8 F (36.6 C)  Resp: 18   Physical Exam  Constitutional: He is well-developed, well-nourished, and in no distress.  HENT:  Head: Atraumatic.  Right Ear: Tympanic membrane and ear canal normal.  Left Ear: Tympanic membrane and ear canal normal.  Nose: No mucosal edema or rhinorrhea. No epistaxis.  Mouth/Throat: Uvula is midline, oropharynx is clear and moist and mucous membranes are normal.  Neck: Neck supple.  Cardiovascular: Normal rate, S1 normal and S2 normal.   No murmur heard. Pulmonary/Chest: Effort normal and breath sounds normal. He has no wheezes. He has no rhonchi. He has no rales.  Skin: Skin is dry. Rash noted. No purpura noted. Rash is not nodular, not pustular, not vesicular and not urticarial. No cyanosis. Nails show no clubbing.  Generalized xerosis with scratched well healed areas and scattered rough patchy areas--no easily identifiable discrete lesions.    Diagnostics: FVC 4.56--94%, FEV1 3.58--94%.  Assessment:   1. Rash   2. Xerosis of skin   3. Severe Pruritis.  4.      Negative selective skin food skin testing.  5.      Mild allergic rhinitis.  Plan:   Meds ordered this encounter  Medications  . mometasone (ELOCON) 0.1 % ointment    Sig: Apply topically daily. Apply to red areas on body twice daily as needed.    Dispense:  45 g  Refill:  2  . doxepin (SINEQUAN) 10 MG capsule    Sig: Take 1 capsule (10 mg total) by mouth at bedtime as needed.    Dispense:  30 capsule    Refill:  0   Patient Instructions  Take Home Sheet  1. Avoidance:  Trial off wheat, egg and shrimp.  2. Antihistamine: Zyrtec10mg  by mouth twice daily for itching.  3.  Discontinue Hydroxyzine.  4.   Begin Trial of Doxepin 10 mg once at bedtime.  5.   Continue 2-4  times daily moisturizing of skin.  6.   Begin trial of Mometasone ointment to rash areas at body twice daily as needed.  7.   Dr. Juel Burrow office will call you with new Dermatology appointment and plan for selected labs.   (as was discussed in my phone conversation with Dr. Nevada Crane today)  8.   Follow up Visit: 4  Months or sooner if needed.     [name] M. Ishmael Holter, MD  cc:  Wende Neighbors, MD

## 2015-09-25 DIAGNOSIS — R21 Rash and other nonspecific skin eruption: Secondary | ICD-10-CM | POA: Diagnosis not present

## 2015-09-25 DIAGNOSIS — L298 Other pruritus: Secondary | ICD-10-CM | POA: Diagnosis not present

## 2015-09-25 DIAGNOSIS — E782 Mixed hyperlipidemia: Secondary | ICD-10-CM | POA: Diagnosis not present

## 2015-09-25 DIAGNOSIS — I482 Chronic atrial fibrillation: Secondary | ICD-10-CM | POA: Diagnosis not present

## 2015-10-04 DIAGNOSIS — E782 Mixed hyperlipidemia: Secondary | ICD-10-CM | POA: Diagnosis not present

## 2015-10-04 DIAGNOSIS — R21 Rash and other nonspecific skin eruption: Secondary | ICD-10-CM | POA: Diagnosis not present

## 2015-10-04 DIAGNOSIS — I482 Chronic atrial fibrillation: Secondary | ICD-10-CM | POA: Diagnosis not present

## 2015-10-28 DIAGNOSIS — L239 Allergic contact dermatitis, unspecified cause: Secondary | ICD-10-CM | POA: Diagnosis not present

## 2015-11-03 DIAGNOSIS — R21 Rash and other nonspecific skin eruption: Secondary | ICD-10-CM | POA: Diagnosis not present

## 2015-12-17 ENCOUNTER — Encounter (HOSPITAL_COMMUNITY): Payer: Self-pay | Admitting: Emergency Medicine

## 2015-12-17 ENCOUNTER — Emergency Department (HOSPITAL_COMMUNITY)
Admission: EM | Admit: 2015-12-17 | Discharge: 2015-12-17 | Disposition: A | Discharge status: Home / Self Care | Payer: Commercial Managed Care - HMO | Attending: Emergency Medicine | Admitting: Emergency Medicine

## 2015-12-17 DIAGNOSIS — Z792 Long term (current) use of antibiotics: Secondary | ICD-10-CM | POA: Diagnosis not present

## 2015-12-17 DIAGNOSIS — M7989 Other specified soft tissue disorders: Secondary | ICD-10-CM | POA: Diagnosis not present

## 2015-12-17 DIAGNOSIS — I482 Chronic atrial fibrillation: Secondary | ICD-10-CM | POA: Insufficient documentation

## 2015-12-17 DIAGNOSIS — Z7951 Long term (current) use of inhaled steroids: Secondary | ICD-10-CM | POA: Diagnosis not present

## 2015-12-17 DIAGNOSIS — R600 Localized edema: Secondary | ICD-10-CM | POA: Insufficient documentation

## 2015-12-17 DIAGNOSIS — Z79899 Other long term (current) drug therapy: Secondary | ICD-10-CM | POA: Insufficient documentation

## 2015-12-17 DIAGNOSIS — I10 Essential (primary) hypertension: Secondary | ICD-10-CM | POA: Insufficient documentation

## 2015-12-17 DIAGNOSIS — E785 Hyperlipidemia, unspecified: Secondary | ICD-10-CM | POA: Diagnosis not present

## 2015-12-17 DIAGNOSIS — R21 Rash and other nonspecific skin eruption: Secondary | ICD-10-CM | POA: Insufficient documentation

## 2015-12-17 DIAGNOSIS — Z7901 Long term (current) use of anticoagulants: Secondary | ICD-10-CM | POA: Diagnosis not present

## 2015-12-17 LAB — COMPREHENSIVE METABOLIC PANEL
ALBUMIN: 4.1 g/dL (ref 3.5–5.0)
ALT: 37 U/L (ref 17–63)
ANION GAP: 8 (ref 5–15)
AST: 34 U/L (ref 15–41)
Alkaline Phosphatase: 133 U/L — ABNORMAL HIGH (ref 38–126)
BUN: 22 mg/dL — AB (ref 6–20)
CHLORIDE: 106 mmol/L (ref 101–111)
CO2: 28 mmol/L (ref 22–32)
Calcium: 9.2 mg/dL (ref 8.9–10.3)
Creatinine, Ser: 1.06 mg/dL (ref 0.61–1.24)
GFR calc Af Amer: >60 mL/min (ref >60)
GFR calc non Af Amer: >60 mL/min (ref >60)
GLUCOSE: 106 mg/dL — AB (ref 65–99)
POTASSIUM: 4 mmol/L (ref 3.5–5.1)
SODIUM: 142 mmol/L (ref 135–145)
Total Bilirubin: 0.9 mg/dL (ref 0.3–1.2)
Total Protein: 6.9 g/dL (ref 6.5–8.1)

## 2015-12-17 LAB — CBC WITH DIFFERENTIAL/PLATELET
BASOS ABS: 0 10*3/uL (ref 0.0–0.1)
BASOS PCT: 0 %
EOS ABS: 0.9 10*3/uL — AB (ref 0.0–0.7)
EOS PCT: 9 %
HEMATOCRIT: 45.2 % (ref 39.0–52.0)
Hemoglobin: 15 g/dL (ref 13.0–17.0)
Lymphocytes Relative: 8 %
Lymphs Abs: 0.9 10*3/uL (ref 0.7–4.0)
MCH: 29.8 pg (ref 26.0–34.0)
MCHC: 33.2 g/dL (ref 30.0–36.0)
MCV: 89.7 fL (ref 78.0–100.0)
MONO ABS: 1.1 10*3/uL — AB (ref 0.1–1.0)
Monocytes Relative: 11 %
NEUTROS ABS: 7.3 10*3/uL (ref 1.7–7.7)
Neutrophils Relative %: 72 %
PLATELETS: 259 10*3/uL (ref 150–400)
RBC: 5.04 MIL/uL (ref 4.22–5.81)
RDW: 13.2 % (ref 11.5–15.5)
WBC: 10.2 10*3/uL (ref 4.0–10.5)

## 2015-12-17 LAB — URINALYSIS, ROUTINE W REFLEX MICROSCOPIC
Bilirubin Urine: NEGATIVE
GLUCOSE, UA: NEGATIVE mg/dL
Hgb urine dipstick: NEGATIVE
Ketones, ur: NEGATIVE mg/dL
LEUKOCYTES UA: NEGATIVE
NITRITE: NEGATIVE
PH: 5.5 (ref 5.0–8.0)
PROTEIN: NEGATIVE mg/dL
Specific Gravity, Urine: >1.03 — ABNORMAL HIGH (ref 1.005–1.030)

## 2015-12-17 LAB — BRAIN NATRIURETIC PEPTIDE: B Natriuretic Peptide: 144 pg/mL — ABNORMAL HIGH (ref 0.0–100.0)

## 2015-12-17 MED ORDER — FUROSEMIDE 40 MG PO TABS
40.0000 mg | ORAL_TABLET | Freq: Once | ORAL | Status: AC
  Administered 2015-12-17: 40 mg via ORAL
  Filled 2015-12-17: qty 1

## 2015-12-17 MED ORDER — DEXAMETHASONE SODIUM PHOSPHATE 4 MG/ML IJ SOLN
10.0000 mg | Freq: Once | INTRAMUSCULAR | Status: AC
  Administered 2015-12-17: 10 mg via INTRAVENOUS
  Filled 2015-12-17: qty 3

## 2015-12-17 MED ORDER — FUROSEMIDE 40 MG PO TABS: 40.0000 mg | ORAL_TABLET | Freq: Every day | ORAL | Status: DC

## 2015-12-17 NOTE — ED Provider Notes (Signed)
CSN: [id number]     Arrival date & time 12/17/15  1112 History  By signing my name below, I, Arianna Nassar, attest that this documentation has been prepared under the direction and in the presence of Noemi Chapel, MD. Electronically Signed: Julien Nordmann, ED Scribe. 12/17/2015. 12:34 PM.    Chief Complaint  Patient presents with  . Rash      The history is provided by the patient. No language interpreter was used.   HPI Comments: William Sandoval is a 70 y.o. male who presents to the Emergency Department complaining of constant, gradual worsening over the body rash onset 8 months ago. He describes the pain as burning and stinging. The rash is on his right arm, back, and head. He notes he has now had bilateral lower leg swelling that has been happening for the past two weeks. Pt expresses pain when ambulating and notes they are tender to touch. Pt states he sees a dermatologist to try and figure out what is causing the rash. Pt reports he is currently on multiple medications but he is unsure if they are causing the rash. He has received predisone and topical cream to try and alleviate his symptoms with no relief. He denies sob, chest pain, back pain, and cough.  Past Medical History  Diagnosis Date  . Chronic atrial fibrillation ([place])   . Hypertension   . Dyslipidemia   . Epistaxis    Past Surgical History  Procedure Laterality Date  . Shoulder surgery      Right   Family History  Problem Relation Age of Onset  . Heart attack Mother    Social History  Substance Use Topics  . Smoking status: Never Smoker   . Smokeless tobacco: Never Used  . Alcohol Use: No    Review of Systems  Cardiovascular: Positive for leg swelling.  All other systems reviewed and are negative.     Allergies  Gluten meal; Shrimp; and Sertraline  Home Medications   Prior to Admission medications   Medication Sig Start Date End Date Taking? Authorizing Provider  apixaban (ELIQUIS) 5 MG TABS tablet  Take 5 mg by mouth 2 (two) times daily.    Historical Provider, MD  cephALEXin (KEFLEX) 500 MG capsule Take 1 capsule (500 mg total) by mouth 4 (four) times daily. 08/26/15   Nat Christen, MD  Cetirizine HCl (ZYRTEC ALLERGY) 10 MG CAPS Take 10 mg by mouth daily.    Historical Provider, MD  doxepin (SINEQUAN) 10 MG capsule Take 1 capsule (10 mg total) by mouth at bedtime as needed. 09/13/15   Roselyn Malachy Moan, MD  Fluocinolone Acetonide 0.01 % OIL Apply 1 application topically 2 (two) times daily as needed (Rash).    Historical Provider, MD  fluticasone (FLOVENT HFA) 220 MCG/ACT inhaler Inhale 2 puffs into the lungs 2 (two) times daily.    Historical Provider, MD  furosemide (LASIX) 40 MG tablet Take 1 tablet (40 mg total) by mouth daily. 12/17/15   Noemi Chapel, MD  hydrochlorothiazide (HYDRODIURIL) 12.5 MG tablet Take 12.5 mg by mouth daily.    Historical Provider, MD  hydrOXYzine (ATARAX/VISTARIL) 10 MG tablet Take 10 mg by mouth 3 (three) times daily as needed for itching.    Historical Provider, MD  ibuprofen (ADVIL,MOTRIN) 800 MG tablet Take 800 mg by mouth every 8 (eight) hours as needed for mild pain.    Historical Provider, MD  levothyroxine (SYNTHROID, LEVOTHROID) 75 MCG tablet Take 75 mcg by mouth at bedtime.  Historical Provider, MD  metoprolol (LOPRESSOR) 50 MG tablet Take 50 mg by mouth 2 (two) times daily.    Historical Provider, MD  mometasone (ELOCON) 0.1 % ointment Apply topically daily. Apply to red areas on body twice daily as needed. 09/13/15   Roselyn Malachy Moan, MD  predniSONE (DELTASONE) 50 MG tablet 1 tablet for 7 days, one half tablet for 7 days, one half tablet every other day for 7 days Patient not taking: Reported on 09/12/2015 08/26/15   Nat Christen, MD  simvastatin (ZOCOR) 20 MG tablet Take 20 mg by mouth at bedtime.      Historical Provider, MD  warfarin (COUMADIN) 5 MG tablet Take 5 mg by mouth daily at 6 PM.     Historical Provider, MD   Triage vitals: BP 143/99 mmHg   Pulse 75  Temp(Src) 98.3 F (36.8 C) (Temporal)  Resp 20  Ht 6\' 2"  (1.88 m)  Wt 240 lb (108.863 kg)  BMI 30.80 kg/m2  SpO2 100% Physical Exam  Constitutional: He appears well-developed and well-nourished. No distress.  HENT:  Head: Normocephalic and atraumatic.  Mouth/Throat: Oropharynx is clear and moist. No oropharyngeal exudate.  Eyes: Conjunctivae and EOM are normal. Pupils are equal, round, and reactive to light. Right eye exhibits no discharge. Left eye exhibits no discharge. No scleral icterus.  Neck: Normal range of motion. Neck supple. No JVD present. No thyromegaly present.  Cardiovascular: Normal rate, regular rhythm, normal heart sounds and intact distal pulses.  Exam reveals no gallop and no friction rub.   No murmur heard. Pulmonary/Chest: Effort normal and breath sounds normal. No respiratory distress. He has no wheezes. He has no rales.  Abdominal: Soft. Bowel sounds are normal. He exhibits no distension and no mass. There is no tenderness.  Musculoskeletal: Normal range of motion. He exhibits edema. He exhibits no tenderness.  2+ pitting edema in both legs  Lymphadenopathy:    He has no cervical adenopathy.  Neurological: He is alert. Coordination normal.  Skin: Skin is warm and dry. Rash noted. He is not diaphoretic. No erythema.  Psychiatric: He has a normal mood and affect. His behavior is normal.  Nursing note and vitals reviewed.   ED Course  Procedures  DIAGNOSTIC STUDIES: Oxygen Saturation is 100% on RA, normal by my interpretation.  COORDINATION OF CARE:  12:14 PM Discussed treatment plan with pt at bedside and pt agreed to plan.  Labs Review Labs Reviewed  CBC WITH DIFFERENTIAL/PLATELET - Abnormal; Notable for the following:    Monocytes Absolute 1.1 (*)    Eosinophils Absolute 0.9 (*)    All other components within normal limits  COMPREHENSIVE METABOLIC PANEL - Abnormal; Notable for the following:    Glucose, Bld 106 (*)    BUN 22 (*)     Alkaline Phosphatase 133 (*)    All other components within normal limits  BRAIN NATRIURETIC PEPTIDE - Abnormal; Notable for the following:    B Natriuretic Peptide 144.0 (*)    All other components within normal limits  URINALYSIS, ROUTINE W REFLEX MICROSCOPIC (NOT AT [facility]) - Abnormal; Notable for the following:    Specific Gravity, Urine >1.030 (*)    All other components within normal limits    Imaging Review No results found. I have personally reviewed and evaluated these images and lab results as part of my medical decision-making.    MDM   Final diagnoses:  Rash  Swelling of both lower extremities    The pt is in no distress -  8 months of rash Peripheral edema without signs of CHF / albumin normal, renal normal, UA without protein. Lasix Decadron  F/u with Derm this month at [facility] already scheduled.  I personally performed the services described in this documentation, which was scribed in my presence. The recorded information has been reviewed and is accurate.      Noemi Chapel, MD 12/17/15 1335

## 2015-12-17 NOTE — Discharge Instructions (Signed)

## 2015-12-17 NOTE — ED Notes (Signed)
Patient c/o rash to entire body that burns and stings. Per patient rash x8 months and is progressively getting worse. Patient states "I can't sleep hardly anymore." Patient states that he is to see someone in [place] to have a "patch" and try to figure out what is causing rash. Patient states that he now has swelling to lower legs bilaterally which are very sensitive to touch.

## 2015-12-21 DIAGNOSIS — R21 Rash and other nonspecific skin eruption: Secondary | ICD-10-CM | POA: Diagnosis not present

## 2016-01-01 DIAGNOSIS — L309 Dermatitis, unspecified: Secondary | ICD-10-CM | POA: Diagnosis not present

## 2016-01-12 DIAGNOSIS — Z4802 Encounter for removal of sutures: Secondary | ICD-10-CM | POA: Diagnosis not present

## 2016-01-24 DIAGNOSIS — L308 Other specified dermatitis: Secondary | ICD-10-CM | POA: Diagnosis not present

## 2016-01-29 DIAGNOSIS — L309 Dermatitis, unspecified: Secondary | ICD-10-CM | POA: Diagnosis not present

## 2016-03-25 DIAGNOSIS — L235 Allergic contact dermatitis due to other chemical products: Secondary | ICD-10-CM | POA: Diagnosis not present

## 2016-03-28 DIAGNOSIS — E039 Hypothyroidism, unspecified: Secondary | ICD-10-CM | POA: Diagnosis not present

## 2016-03-28 DIAGNOSIS — R600 Localized edema: Secondary | ICD-10-CM | POA: Diagnosis not present

## 2016-05-23 DIAGNOSIS — I10 Essential (primary) hypertension: Secondary | ICD-10-CM | POA: Diagnosis not present

## 2016-05-23 DIAGNOSIS — E039 Hypothyroidism, unspecified: Secondary | ICD-10-CM | POA: Diagnosis not present

## 2016-05-23 DIAGNOSIS — Z125 Encounter for screening for malignant neoplasm of prostate: Secondary | ICD-10-CM | POA: Diagnosis not present

## 2016-05-23 DIAGNOSIS — E782 Mixed hyperlipidemia: Secondary | ICD-10-CM | POA: Diagnosis not present

## 2016-05-29 DIAGNOSIS — E782 Mixed hyperlipidemia: Secondary | ICD-10-CM | POA: Diagnosis not present

## 2016-05-29 DIAGNOSIS — E039 Hypothyroidism, unspecified: Secondary | ICD-10-CM | POA: Diagnosis not present

## 2016-05-29 DIAGNOSIS — I482 Chronic atrial fibrillation: Secondary | ICD-10-CM | POA: Diagnosis not present

## 2016-05-29 DIAGNOSIS — I10 Essential (primary) hypertension: Secondary | ICD-10-CM | POA: Diagnosis not present

## 2016-11-07 DIAGNOSIS — E782 Mixed hyperlipidemia: Secondary | ICD-10-CM | POA: Diagnosis not present

## 2016-11-07 DIAGNOSIS — I10 Essential (primary) hypertension: Secondary | ICD-10-CM | POA: Diagnosis not present

## 2016-11-07 DIAGNOSIS — E039 Hypothyroidism, unspecified: Secondary | ICD-10-CM | POA: Diagnosis not present

## 2016-11-11 DIAGNOSIS — E039 Hypothyroidism, unspecified: Secondary | ICD-10-CM | POA: Diagnosis not present

## 2016-11-11 DIAGNOSIS — L2089 Other atopic dermatitis: Secondary | ICD-10-CM | POA: Diagnosis not present

## 2016-11-11 DIAGNOSIS — Z6828 Body mass index (BMI) 28.0-28.9, adult: Secondary | ICD-10-CM | POA: Diagnosis not present

## 2016-11-11 DIAGNOSIS — E782 Mixed hyperlipidemia: Secondary | ICD-10-CM | POA: Diagnosis not present

## 2016-11-11 DIAGNOSIS — I10 Essential (primary) hypertension: Secondary | ICD-10-CM | POA: Diagnosis not present

## 2016-11-11 DIAGNOSIS — I482 Chronic atrial fibrillation: Secondary | ICD-10-CM | POA: Diagnosis not present

## 2016-11-13 ENCOUNTER — Other Ambulatory Visit: Payer: Self-pay | Admitting: Pharmacist

## 2016-11-13 NOTE — Patient Outreach (Signed)
Outreach call to [employer] regarding his request for follow up from the [facility] Medication Adherence Campaign. Called and spoke with patient. HIPAA identifiers verified and verbal consent received.  Reports that he has been dealing with a skin issue and has not been focused on his medications. Reports that he saw his PCP, Pablo Lawrence, earlier this week and that he realized at that time that he has not been taking all of his medications. Reports that Loma Sousa called in some refills for him and that he now has his levothyroxine and simvastatin. Reports that he is going back up to the office today to confirm his current medication regimen with Loma Sousa and to obtain an updated medication list. Reports that he will then put this list on his fridge. Discuss with Mr. Goffe the importance of medication adherence. Suggest that patient use a pillbox to organize his medications. Patient is agreeable to this plan.  Mr. Bradburn reports that he does have difficulty with affording his Eliquis. Reports that based on his income/savings, he does not qualify for extra help. Mr. Ehrler reports that he will call me to complete the patient assistance application through the manufacturer in 2018 once he has met the out of pocket requirement. Provide Mr. Rittenberry with my phone number.  Will close pharmacy episode at this time.  Harlow Asa, PharmD, [facility] [phone]

## 2016-12-17 DIAGNOSIS — L309 Dermatitis, unspecified: Secondary | ICD-10-CM | POA: Diagnosis not present

## 2016-12-17 DIAGNOSIS — D229 Melanocytic nevi, unspecified: Secondary | ICD-10-CM | POA: Diagnosis not present

## 2016-12-17 DIAGNOSIS — Z5181 Encounter for therapeutic drug level monitoring: Secondary | ICD-10-CM | POA: Diagnosis not present

## 2017-01-08 IMAGING — US US ABDOMEN COMPLETE
1 series · 14 of 25 positions shown · non-contrast
Comparison: None.

CLINICAL DATA: Left lower quadrant pain.

EXAM:
ULTRASOUND ABDOMEN COMPLETE

[Series 1: us abdomen complete · 0.17mm/px · 14 of 96 slices shown]
[im 1/96]
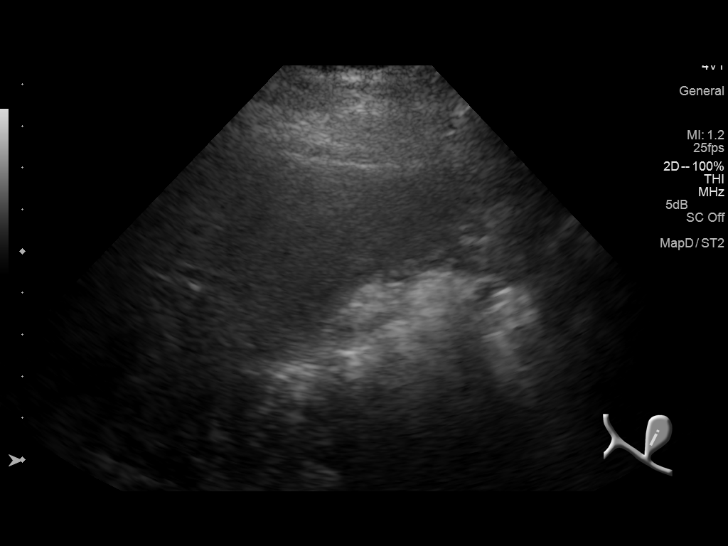
[im 8/96]
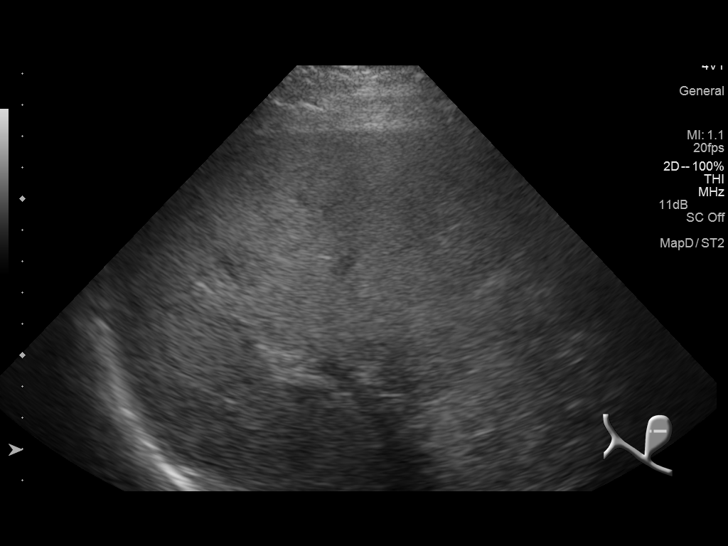
[im 16/96]
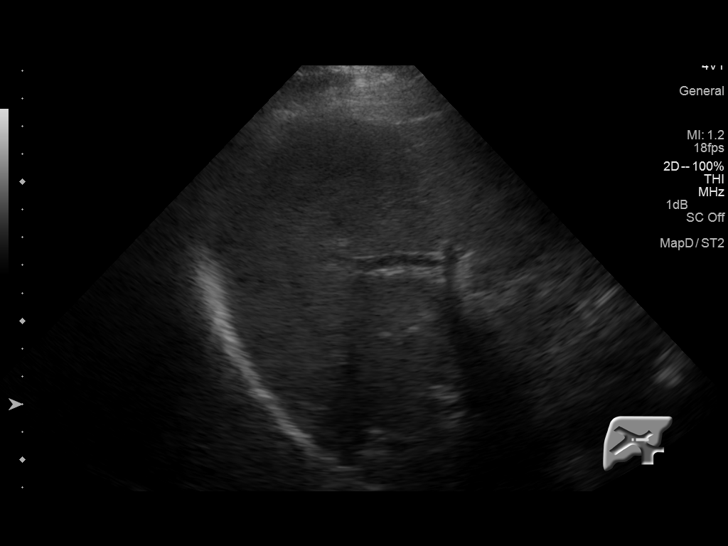
[im 24/96]
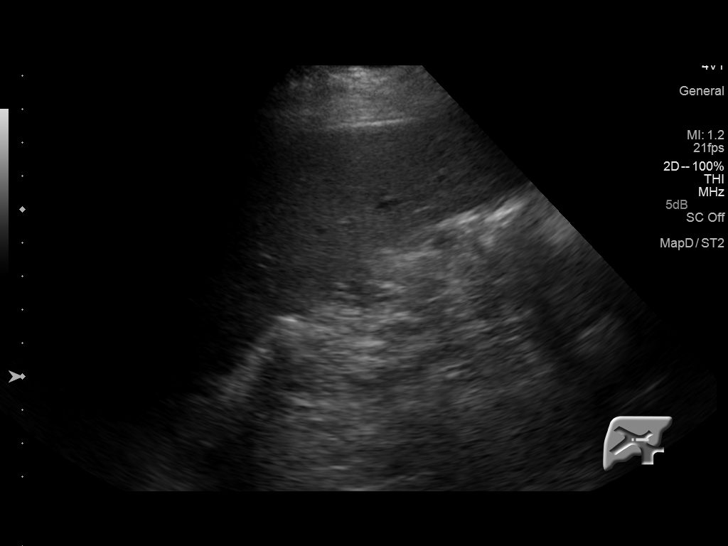
[im 32/96]
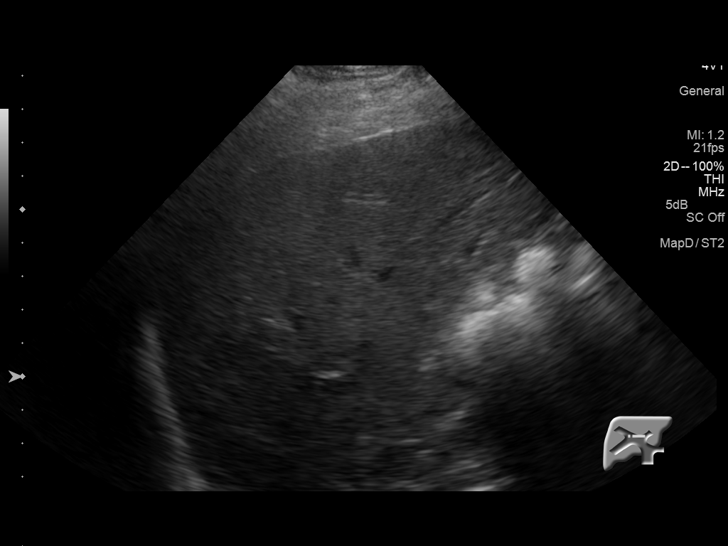
[im 36/96]
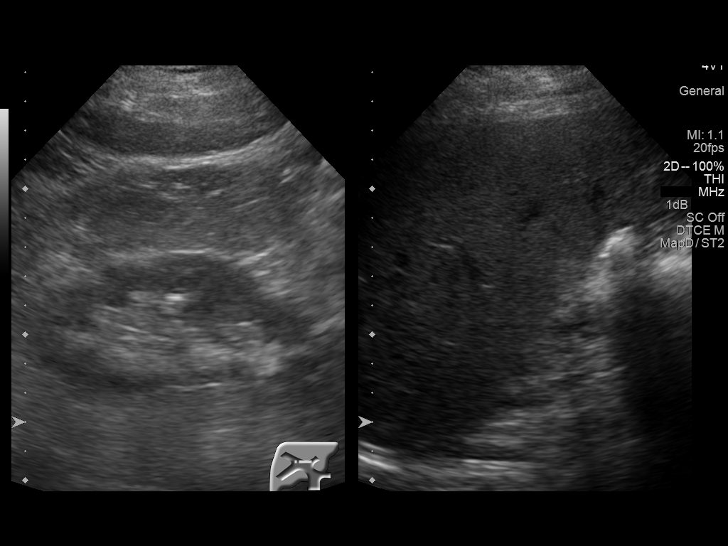
[im 44/96]
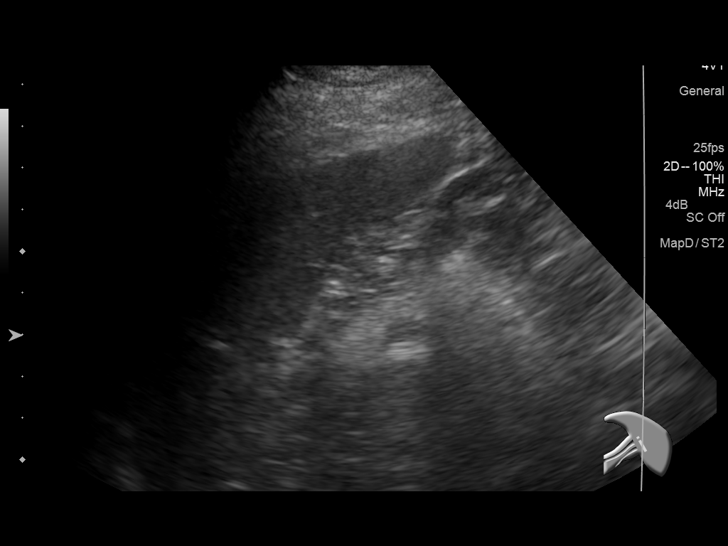
[im 52/96]
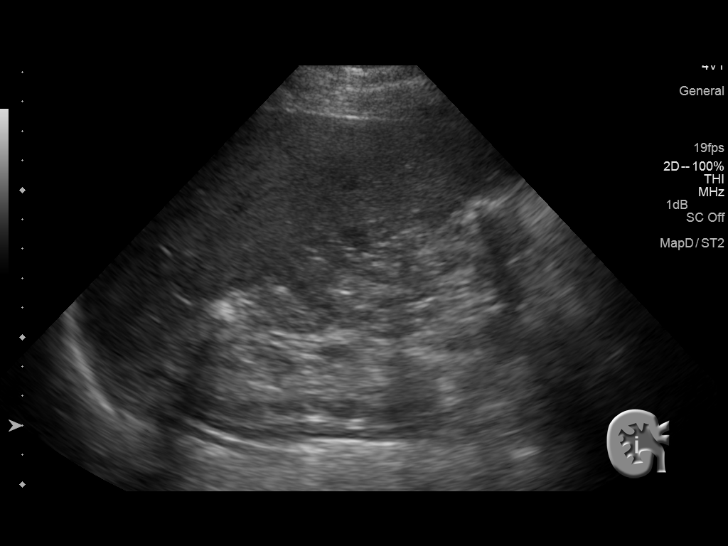
[im 60/96]
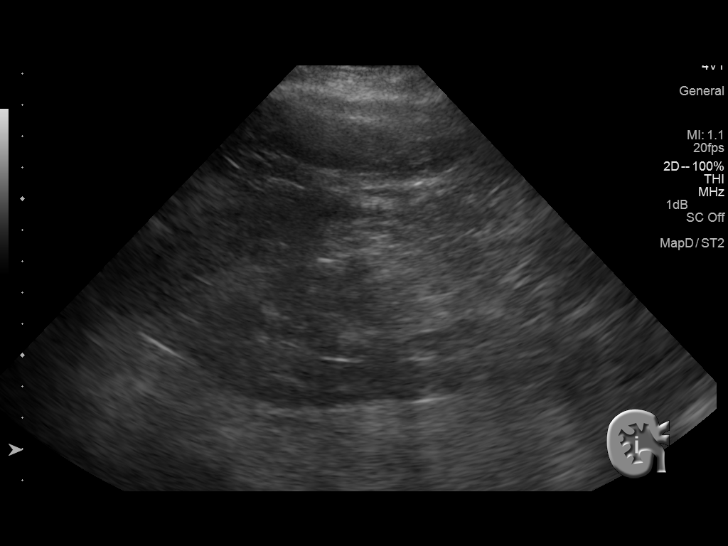
[im 64/96]
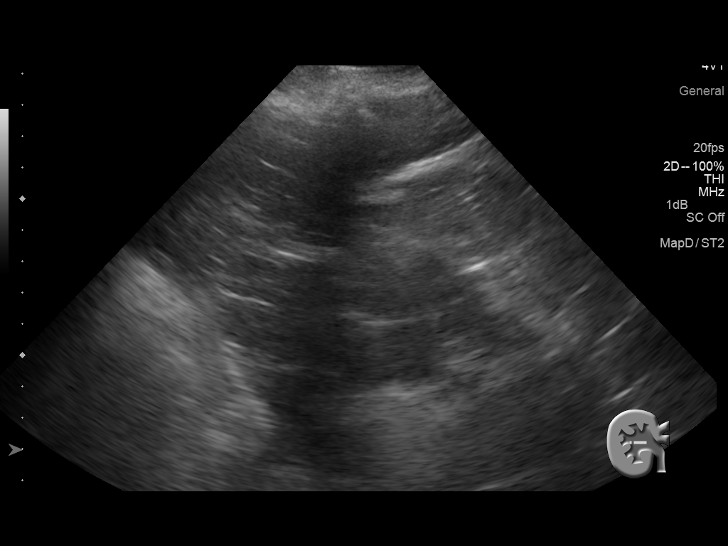
[im 72/96]
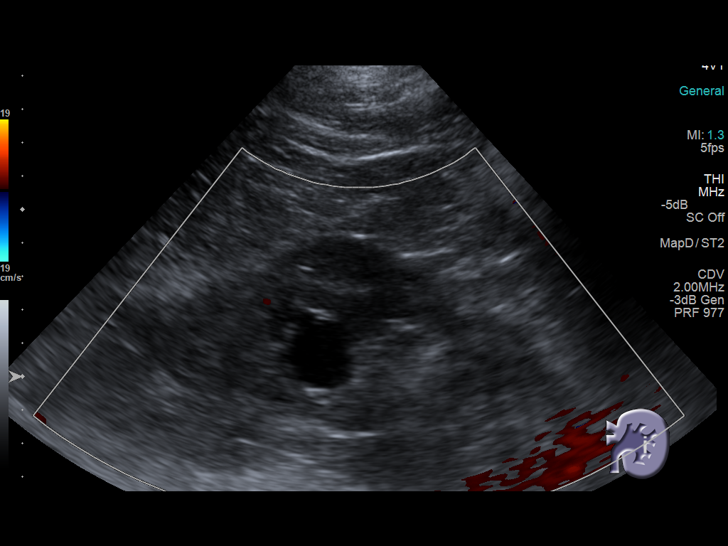
[im 80/96]
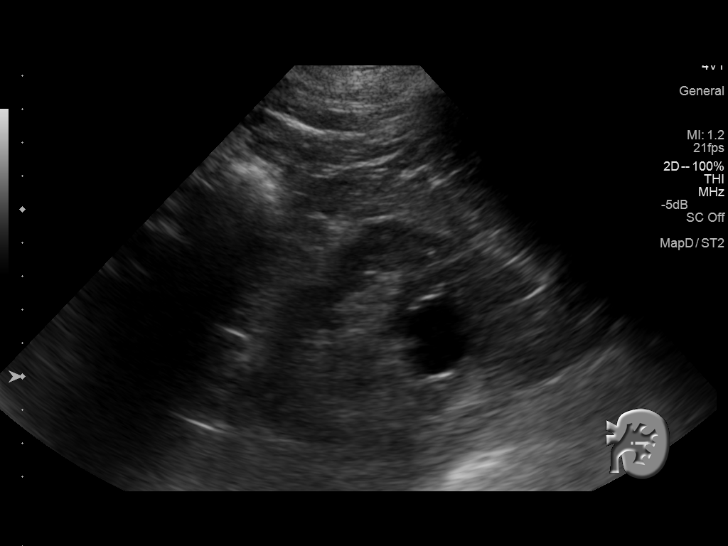
[im 88/96]
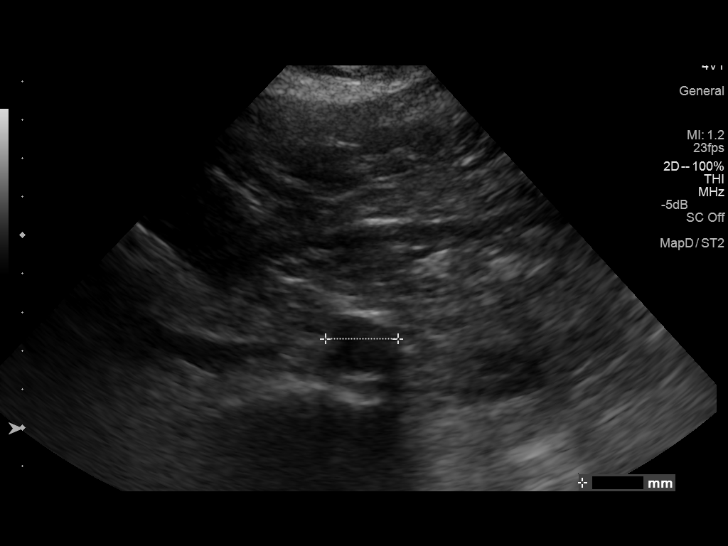
[im 96/96]
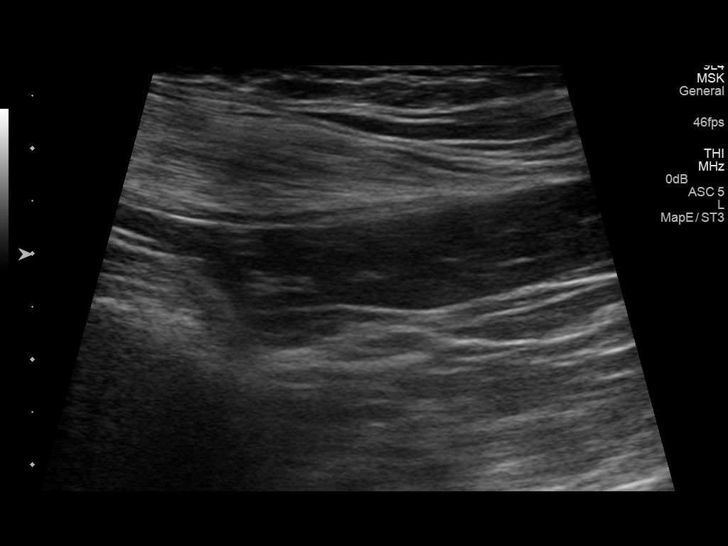

[14 of 25 positions shown; findings below may reference images not displayed]

FINDINGS: Gallbladder: Not definitively visualized. Negative sonographic
[place].

Common bile duct: Diameter: 6 mm in diameter.

Liver: No focal lesion identified. Within normal limits in
parenchymal echogenicity.

IVC: No abnormality visualized.

Pancreas: Visualized portion unremarkable.

Spleen: Size and appearance within normal limits.

Right Kidney: Length: 12.0 cm. Increased echotexture within the
right kidney. No hydronephrosis or focal abnormality.

Left Kidney: Length: 13.7 cm. Normal echotexture. No hydronephrosis.
2.6 cm central parapelvic cyst.

Abdominal aorta: No aneurysm visualized.

Other findings: None.
IMPRESSION: No acute findings. Unable to visualize the gallbladder. Negative
sonographic Murphy's sign.

## 2017-01-14 DIAGNOSIS — E039 Hypothyroidism, unspecified: Secondary | ICD-10-CM | POA: Diagnosis not present

## 2017-01-14 DIAGNOSIS — E782 Mixed hyperlipidemia: Secondary | ICD-10-CM | POA: Diagnosis not present

## 2017-01-14 DIAGNOSIS — I10 Essential (primary) hypertension: Secondary | ICD-10-CM | POA: Diagnosis not present

## 2017-01-17 DIAGNOSIS — I10 Essential (primary) hypertension: Secondary | ICD-10-CM | POA: Diagnosis not present

## 2017-01-17 DIAGNOSIS — I482 Chronic atrial fibrillation: Secondary | ICD-10-CM | POA: Diagnosis not present

## 2017-01-17 DIAGNOSIS — Z6829 Body mass index (BMI) 29.0-29.9, adult: Secondary | ICD-10-CM | POA: Diagnosis not present

## 2017-01-17 DIAGNOSIS — E039 Hypothyroidism, unspecified: Secondary | ICD-10-CM | POA: Diagnosis not present

## 2017-02-03 DIAGNOSIS — Z5181 Encounter for therapeutic drug level monitoring: Secondary | ICD-10-CM | POA: Diagnosis not present

## 2017-02-24 DIAGNOSIS — I482 Chronic atrial fibrillation: Secondary | ICD-10-CM | POA: Diagnosis not present

## 2017-02-24 DIAGNOSIS — R42 Dizziness and giddiness: Secondary | ICD-10-CM | POA: Diagnosis not present

## 2017-03-24 DIAGNOSIS — I482 Chronic atrial fibrillation: Secondary | ICD-10-CM | POA: Diagnosis not present

## 2017-03-24 DIAGNOSIS — Z6830 Body mass index (BMI) 30.0-30.9, adult: Secondary | ICD-10-CM | POA: Diagnosis not present

## 2017-04-14 DIAGNOSIS — I482 Chronic atrial fibrillation: Secondary | ICD-10-CM | POA: Diagnosis not present

## 2017-04-14 DIAGNOSIS — Z7901 Long term (current) use of anticoagulants: Secondary | ICD-10-CM | POA: Diagnosis not present

## 2017-04-14 DIAGNOSIS — Z6830 Body mass index (BMI) 30.0-30.9, adult: Secondary | ICD-10-CM | POA: Diagnosis not present

## 2017-05-12 DIAGNOSIS — L309 Dermatitis, unspecified: Secondary | ICD-10-CM | POA: Diagnosis not present

## 2017-05-12 DIAGNOSIS — Z6830 Body mass index (BMI) 30.0-30.9, adult: Secondary | ICD-10-CM | POA: Diagnosis not present

## 2017-05-12 DIAGNOSIS — I482 Chronic atrial fibrillation: Secondary | ICD-10-CM | POA: Diagnosis not present

## 2017-05-20 DIAGNOSIS — L309 Dermatitis, unspecified: Secondary | ICD-10-CM | POA: Diagnosis not present

## 2017-05-20 DIAGNOSIS — Z79899 Other long term (current) drug therapy: Secondary | ICD-10-CM | POA: Diagnosis not present

## 2017-06-09 DIAGNOSIS — J069 Acute upper respiratory infection, unspecified: Secondary | ICD-10-CM | POA: Diagnosis not present

## 2017-06-09 DIAGNOSIS — R05 Cough: Secondary | ICD-10-CM | POA: Diagnosis not present

## 2017-06-09 DIAGNOSIS — Z6830 Body mass index (BMI) 30.0-30.9, adult: Secondary | ICD-10-CM | POA: Diagnosis not present

## 2017-06-09 DIAGNOSIS — I482 Chronic atrial fibrillation: Secondary | ICD-10-CM | POA: Diagnosis not present

## 2017-06-09 DIAGNOSIS — I10 Essential (primary) hypertension: Secondary | ICD-10-CM | POA: Diagnosis not present

## 2017-06-09 DIAGNOSIS — Z5181 Encounter for therapeutic drug level monitoring: Secondary | ICD-10-CM | POA: Diagnosis not present

## 2017-06-16 DIAGNOSIS — R600 Localized edema: Secondary | ICD-10-CM | POA: Diagnosis not present

## 2017-06-16 DIAGNOSIS — Z6830 Body mass index (BMI) 30.0-30.9, adult: Secondary | ICD-10-CM | POA: Diagnosis not present

## 2017-06-16 DIAGNOSIS — I482 Chronic atrial fibrillation: Secondary | ICD-10-CM | POA: Diagnosis not present

## 2017-06-26 DIAGNOSIS — L209 Atopic dermatitis, unspecified: Secondary | ICD-10-CM | POA: Diagnosis not present

## 2017-07-16 DIAGNOSIS — E039 Hypothyroidism, unspecified: Secondary | ICD-10-CM | POA: Diagnosis not present

## 2017-07-16 DIAGNOSIS — I10 Essential (primary) hypertension: Secondary | ICD-10-CM | POA: Diagnosis not present

## 2017-07-16 DIAGNOSIS — L239 Allergic contact dermatitis, unspecified cause: Secondary | ICD-10-CM | POA: Diagnosis not present

## 2017-07-16 DIAGNOSIS — Z125 Encounter for screening for malignant neoplasm of prostate: Secondary | ICD-10-CM | POA: Diagnosis not present

## 2017-07-16 DIAGNOSIS — Z1159 Encounter for screening for other viral diseases: Secondary | ICD-10-CM | POA: Diagnosis not present

## 2017-07-16 DIAGNOSIS — Z7901 Long term (current) use of anticoagulants: Secondary | ICD-10-CM | POA: Diagnosis not present

## 2017-07-18 DIAGNOSIS — R600 Localized edema: Secondary | ICD-10-CM | POA: Diagnosis not present

## 2017-07-18 DIAGNOSIS — I482 Chronic atrial fibrillation: Secondary | ICD-10-CM | POA: Diagnosis not present

## 2017-07-18 DIAGNOSIS — I10 Essential (primary) hypertension: Secondary | ICD-10-CM | POA: Diagnosis not present

## 2017-07-18 DIAGNOSIS — Z6829 Body mass index (BMI) 29.0-29.9, adult: Secondary | ICD-10-CM | POA: Diagnosis not present

## 2017-07-18 DIAGNOSIS — E039 Hypothyroidism, unspecified: Secondary | ICD-10-CM | POA: Diagnosis not present

## 2017-07-18 DIAGNOSIS — L239 Allergic contact dermatitis, unspecified cause: Secondary | ICD-10-CM | POA: Diagnosis not present

## 2017-07-25 DIAGNOSIS — I10 Essential (primary) hypertension: Secondary | ICD-10-CM | POA: Diagnosis not present

## 2017-07-25 DIAGNOSIS — Z6828 Body mass index (BMI) 28.0-28.9, adult: Secondary | ICD-10-CM | POA: Diagnosis not present

## 2017-07-25 DIAGNOSIS — I482 Chronic atrial fibrillation: Secondary | ICD-10-CM | POA: Diagnosis not present

## 2017-08-08 DIAGNOSIS — I482 Chronic atrial fibrillation: Secondary | ICD-10-CM | POA: Diagnosis not present

## 2017-08-08 DIAGNOSIS — Z6828 Body mass index (BMI) 28.0-28.9, adult: Secondary | ICD-10-CM | POA: Diagnosis not present

## 2017-08-14 DIAGNOSIS — Z6828 Body mass index (BMI) 28.0-28.9, adult: Secondary | ICD-10-CM | POA: Diagnosis not present

## 2017-08-14 DIAGNOSIS — I482 Chronic atrial fibrillation: Secondary | ICD-10-CM | POA: Diagnosis not present

## 2017-08-14 DIAGNOSIS — Z7901 Long term (current) use of anticoagulants: Secondary | ICD-10-CM | POA: Diagnosis not present

## 2017-09-11 DIAGNOSIS — I482 Chronic atrial fibrillation: Secondary | ICD-10-CM | POA: Diagnosis not present

## 2017-09-11 DIAGNOSIS — Z7901 Long term (current) use of anticoagulants: Secondary | ICD-10-CM | POA: Diagnosis not present

## 2017-09-29 DIAGNOSIS — I482 Chronic atrial fibrillation: Secondary | ICD-10-CM | POA: Diagnosis not present

## 2017-10-27 DIAGNOSIS — I482 Chronic atrial fibrillation: Secondary | ICD-10-CM | POA: Diagnosis not present

## 2017-10-27 DIAGNOSIS — Z7901 Long term (current) use of anticoagulants: Secondary | ICD-10-CM | POA: Diagnosis not present

## 2017-11-26 DIAGNOSIS — I482 Chronic atrial fibrillation: Secondary | ICD-10-CM | POA: Diagnosis not present

## 2017-11-26 DIAGNOSIS — Z7901 Long term (current) use of anticoagulants: Secondary | ICD-10-CM | POA: Diagnosis not present

## 2017-12-24 DIAGNOSIS — I482 Chronic atrial fibrillation: Secondary | ICD-10-CM | POA: Diagnosis not present

## 2017-12-24 DIAGNOSIS — Z7901 Long term (current) use of anticoagulants: Secondary | ICD-10-CM | POA: Diagnosis not present

## 2017-12-24 DIAGNOSIS — E782 Mixed hyperlipidemia: Secondary | ICD-10-CM | POA: Diagnosis not present

## 2017-12-24 DIAGNOSIS — E039 Hypothyroidism, unspecified: Secondary | ICD-10-CM | POA: Diagnosis not present

## 2017-12-24 DIAGNOSIS — R7301 Impaired fasting glucose: Secondary | ICD-10-CM | POA: Diagnosis not present

## 2017-12-24 DIAGNOSIS — I10 Essential (primary) hypertension: Secondary | ICD-10-CM | POA: Diagnosis not present

## 2017-12-25 DIAGNOSIS — I482 Chronic atrial fibrillation: Secondary | ICD-10-CM | POA: Diagnosis not present

## 2017-12-25 DIAGNOSIS — Z7901 Long term (current) use of anticoagulants: Secondary | ICD-10-CM | POA: Diagnosis not present

## 2018-01-26 DIAGNOSIS — I482 Chronic atrial fibrillation: Secondary | ICD-10-CM | POA: Diagnosis not present

## 2018-02-23 DIAGNOSIS — Z7901 Long term (current) use of anticoagulants: Secondary | ICD-10-CM | POA: Diagnosis not present

## 2018-02-23 DIAGNOSIS — I482 Chronic atrial fibrillation: Secondary | ICD-10-CM | POA: Diagnosis not present

## 2018-02-24 DIAGNOSIS — L209 Atopic dermatitis, unspecified: Secondary | ICD-10-CM | POA: Diagnosis not present

## 2018-03-23 DIAGNOSIS — E039 Hypothyroidism, unspecified: Secondary | ICD-10-CM | POA: Diagnosis not present

## 2018-03-23 DIAGNOSIS — I10 Essential (primary) hypertension: Secondary | ICD-10-CM | POA: Diagnosis not present

## 2018-03-23 DIAGNOSIS — Z7901 Long term (current) use of anticoagulants: Secondary | ICD-10-CM | POA: Diagnosis not present

## 2018-03-23 DIAGNOSIS — I482 Chronic atrial fibrillation: Secondary | ICD-10-CM | POA: Diagnosis not present

## 2018-03-23 DIAGNOSIS — E782 Mixed hyperlipidemia: Secondary | ICD-10-CM | POA: Diagnosis not present

## 2018-03-24 DIAGNOSIS — E039 Hypothyroidism, unspecified: Secondary | ICD-10-CM | POA: Diagnosis not present

## 2018-03-24 DIAGNOSIS — I482 Chronic atrial fibrillation: Secondary | ICD-10-CM | POA: Diagnosis not present

## 2018-03-24 DIAGNOSIS — I10 Essential (primary) hypertension: Secondary | ICD-10-CM | POA: Diagnosis not present

## 2018-04-21 DIAGNOSIS — I4891 Unspecified atrial fibrillation: Secondary | ICD-10-CM | POA: Diagnosis not present

## 2018-05-19 DIAGNOSIS — I10 Essential (primary) hypertension: Secondary | ICD-10-CM | POA: Diagnosis not present

## 2018-05-19 DIAGNOSIS — Z7901 Long term (current) use of anticoagulants: Secondary | ICD-10-CM | POA: Diagnosis not present

## 2018-05-19 DIAGNOSIS — I482 Chronic atrial fibrillation: Secondary | ICD-10-CM | POA: Diagnosis not present

## 2018-05-19 DIAGNOSIS — E039 Hypothyroidism, unspecified: Secondary | ICD-10-CM | POA: Diagnosis not present

## 2018-06-19 DIAGNOSIS — Z6830 Body mass index (BMI) 30.0-30.9, adult: Secondary | ICD-10-CM | POA: Diagnosis not present

## 2018-06-19 DIAGNOSIS — I4891 Unspecified atrial fibrillation: Secondary | ICD-10-CM | POA: Diagnosis not present

## 2018-07-21 DIAGNOSIS — Z6830 Body mass index (BMI) 30.0-30.9, adult: Secondary | ICD-10-CM | POA: Diagnosis not present

## 2018-07-21 DIAGNOSIS — I4891 Unspecified atrial fibrillation: Secondary | ICD-10-CM | POA: Diagnosis not present

## 2018-08-06 DIAGNOSIS — I482 Chronic atrial fibrillation: Secondary | ICD-10-CM | POA: Diagnosis not present

## 2018-08-06 DIAGNOSIS — I10 Essential (primary) hypertension: Secondary | ICD-10-CM | POA: Diagnosis not present

## 2018-08-06 DIAGNOSIS — E039 Hypothyroidism, unspecified: Secondary | ICD-10-CM | POA: Diagnosis not present

## 2018-08-18 DIAGNOSIS — Z7901 Long term (current) use of anticoagulants: Secondary | ICD-10-CM | POA: Diagnosis not present

## 2018-08-18 DIAGNOSIS — S81811A Laceration without foreign body, right lower leg, initial encounter: Secondary | ICD-10-CM | POA: Diagnosis not present

## 2018-08-18 DIAGNOSIS — Z6830 Body mass index (BMI) 30.0-30.9, adult: Secondary | ICD-10-CM | POA: Diagnosis not present

## 2018-08-25 DIAGNOSIS — S81811A Laceration without foreign body, right lower leg, initial encounter: Secondary | ICD-10-CM | POA: Diagnosis not present

## 2018-08-25 DIAGNOSIS — Z0001 Encounter for general adult medical examination with abnormal findings: Secondary | ICD-10-CM | POA: Diagnosis not present

## 2018-08-25 DIAGNOSIS — Z7901 Long term (current) use of anticoagulants: Secondary | ICD-10-CM | POA: Diagnosis not present

## 2018-08-25 DIAGNOSIS — I480 Paroxysmal atrial fibrillation: Secondary | ICD-10-CM | POA: Diagnosis not present

## 2018-08-28 DIAGNOSIS — S81811D Laceration without foreign body, right lower leg, subsequent encounter: Secondary | ICD-10-CM | POA: Diagnosis not present

## 2018-08-31 DIAGNOSIS — S81811D Laceration without foreign body, right lower leg, subsequent encounter: Secondary | ICD-10-CM | POA: Diagnosis not present

## 2018-09-02 ENCOUNTER — Encounter (HOSPITAL_COMMUNITY): Payer: Self-pay

## 2018-09-02 ENCOUNTER — Other Ambulatory Visit: Payer: Self-pay

## 2018-09-02 ENCOUNTER — Ambulatory Visit (HOSPITAL_COMMUNITY): Payer: Medicare HMO | Attending: Internal Medicine

## 2018-09-02 DIAGNOSIS — S81801A Unspecified open wound, right lower leg, initial encounter: Secondary | ICD-10-CM | POA: Diagnosis not present

## 2018-09-02 DIAGNOSIS — S81811A Laceration without foreign body, right lower leg, initial encounter: Secondary | ICD-10-CM

## 2018-09-02 DIAGNOSIS — R600 Localized edema: Secondary | ICD-10-CM | POA: Insufficient documentation

## 2018-09-02 NOTE — Therapy (Signed)
[place], [place], [address] Phone: [phone]   Fax:  [phone]  Wound Care Evaluation  Patient Details  Name: William Sandoval MRN: [id number] Date of Birth: 1946-09-22 Referring Provider (PT): Celene Squibb, MD   Encounter Date: 09/02/2018  PT End of Session - 09/02/18 1751    Visit Number  1    Number of Visits  13    Date for PT Re-Evaluation  10/14/18    Authorization Type  [employer] (no auth required, visits on medical necessity)    Authorization Time Period  09/02/18 - 10/16/18    Authorization - Visit Number  1    Authorization - Number of Visits  13    PT Start Time  0908    PT Stop Time  0952    PT Time Calculation (min)  44 min    Activity Tolerance  Patient tolerated treatment well    Behavior During Therapy  [facility] for tasks assessed/performed       Past Medical History:  Diagnosis Date  . Chronic atrial fibrillation   . Dyslipidemia   . Epistaxis   . Hypertension     Past Surgical History:  Procedure Laterality Date  . SHOULDER SURGERY     Right    There were no vitals filed for this visit.    [facility] PT Assessment - 09/02/18 0001      Assessment   Medical Diagnosis  Rt Lower Leg Wound    Referring Provider (PT)  Celene Squibb, MD    Prior Therapy  stitches by Dr. [place] Crane      Restrictions   Weight Bearing Restrictions  No      Home Environment   Additional Comments  patient lives alone      Prior Function   Level of Independence  Independent      Cognition   Overall Cognitive Status  Within Functional Limits for tasks assessed      Wound Therapy - 09/02/18 1800    Subjective  Patient report a couple of week ago he was changin a picture frame on the wall and tripped. He hit his Rt leg against a plastic edge of a table and this caused a laceration that created a skin flap. He was seen by his PCP, Dr. Nevada Crane who cleaned the wound and applied 12 stitches. He has been on  antibiotices. He had been seen on 08/31/18 for suture removal as the wound was non-healing however Dr. [place] Crane elected to leave stitches in for the time being. He recently started a new dose of doxycycline antibiotics.     Patient and Family Stated Goals  wound to heal    Date of Onset  08/18/18   approximate   Prior Treatments  stitches by Dr. [place] Crane    Pain Scale  0-10    Pain Score  0-No pain    Evaluation and Treatment Procedures Explained to Patient/Family  Yes    Evaluation and Treatment Procedures  agreed to    Wound Properties Date First Assessed: 09/02/18 Time First Assessed: 0910 Wound Type: Laceration Location: Leg Location Orientation: Right;Lower;Posterior Wound Description (Comments): laceration of Rt calf Present on Admission: Yes   Dressing Type  Alginate;Compression wrap    Dressing Changed  Changed    Dressing Status  Old drainage    Dressing Change Frequency  PRN    Site / Wound Assessment  Red;Yellow;Brown    % Wound base  Red or Granulating  60%    % Wound base Yellow/Fibrinous Exudate  20%    % Wound base Other/Granulation Tissue (Comment)  20%   brown tissue   Peri-wound Assessment  Intact;Edema;Purple;Maceration   patient believes he had a reaction to bandage adhesives   Wound Length (cm)  1 cm    Wound Width (cm)  6.8 cm    Wound Depth (cm)  0.2 cm    Wound Volume (cm^3)  1.36 cm^3    Wound Surface Area (cm^2)  6.8 cm^2    Margins  Unattached edges (unapproximated)    Closure  None    Drainage Amount  Moderate    Drainage Description  Serosanguineous    Treatment  Cleansed;Debridement (Selective);Pressure applied   alginate, profore wrapping   Selective Debridement - Location  wound bed and margins    Selective Debridement - Tools Used  Forceps    Selective Debridement - Tissue Removed  slough, necrotice devitalized tissue    Wound Therapy - Clinical Statement  Mr. Weller presents for wound care evaluation for Rt LE laceration. His wound is draining with  serosanguinous fluid and he has brown/yellow slough in wound bed. Stitches are intact and he would benefit from stitch removal to allow for more comprehensive cleansing and debridement. There is a purple discolored "rash" distal to his wound that he believes is from an adhesive bandage he has been using. The periwound is slightly macerated and red and his LE is swollen. Wound was cleansed and debrided to remove slough. Alginate applied to wound to prevent maceration and compression wrap was applied to address edema. He will benefit from skilled wound care interventions to promote healing environment for wound closure.    Wound Therapy - Functional Problem List  difficulty walking, pain    Factors Delaying/Impairing Wound Healing  Infection - systemic/local   currently on blood thinners for chronic A-fib   Hydrotherapy Plan  Debridement;Dressing change;Patient/family education;Pulsatile lavage with suction    Wound Therapy - Frequency  2X / week    Wound Therapy - Current Recommendations  PT    Wound Plan  Measure wound weekly and continue with alginate while patient is on antibiotics. Follow up with MD about stitch removal. Continue with compression wrapping.     Dressing   alginate, profore wrap       Objective measurements completed on examination: See above findings.    PT Education - 09/02/18 1805    Education Details  Educated on wound care evaluation and treatment. Educated to keep dressing dry and intact until next visit. Educated where to purchase a cast cover if he would like to be able to shower without getting teh wrap and wound dressing wet. Educated to remove 1 layer at a time if the dressing becomes too tight or he starts to loose sensation in his toes, they turn blue, or become very painful.    Person(s) Educated  Patient    Methods  Explanation    Comprehension  Verbalized understanding       PT Short Term Goals - 09/02/18 1807      PT SHORT TERM GOAL #1   Title  Patient  will verbalize signs/symptoms of local and systemic infection to improve safety and awareness of risk of infection with open wound and when to seek medical attention.    Time  2    Period  Weeks    Status  New    Target Date  09/16/18  PT Long Term Goals - 09/02/18 1807      PT LONG TERM GOAL #1   Title  Wound will decresaed by 75% in size to demonstrate wound healing/approximation.    Time  6    Period  Weeks    Status  New    Target Date  10/14/18      PT LONG TERM GOAL #2   Title  Wound will have 100% granulation tissue to improve healthy environment of wound bed and promote healing/wound closure.    Time  6    Period  Weeks    Status  New           Plan - 09/02/18 1752    Clinical Impression Statement  see above    Clinical Presentation  Stable    Clinical Decision Making  Low    Rehab Potential  Good    PT Frequency  2x / week    PT Duration  6 weeks    PT Treatment/Interventions  ADLs/Self Care Home Management;Taping;Manual techniques;Other (comment)   wound care, cleansing and debridement   PT Next Visit Plan  see above    Consulted and Agree with Plan of Care  Patient       Patient will benefit from skilled therapeutic intervention in order to improve the following deficits and impairments:  Decreased skin integrity, Other (comment), Increased edema(risk of infection)  Visit Diagnosis: Laceration of skin of right lower leg, initial encounter  Open wound of right lower leg, initial encounter  Localized edema    Problem List Patient Active Problem List   Diagnosis Date Noted  . Long term (current) use of anticoagulants 03/14/2011  . HYPOTHYROIDISM 12/26/2009  . HYPERLIPIDEMIA 12/26/2009  . ATRIAL FIBRILLATION 12/26/2009  . SHOULDER PAIN, LEFT 12/26/2009    Kipp Brood, PT, DPT Physical Therapist with [facility]  09/02/2018 6:08 PM    [place], [place], [address] Phone: [phone]   Fax:  [phone]  Name: QUINDELL SHERE MRN: [id number] Date of Birth: 10/16/1946

## 2018-09-03 ENCOUNTER — Ambulatory Visit (HOSPITAL_COMMUNITY): Payer: Medicare HMO | Admitting: Physical Therapy

## 2018-09-03 DIAGNOSIS — S81811A Laceration without foreign body, right lower leg, initial encounter: Secondary | ICD-10-CM | POA: Diagnosis not present

## 2018-09-03 DIAGNOSIS — R600 Localized edema: Secondary | ICD-10-CM

## 2018-09-03 DIAGNOSIS — S81801A Unspecified open wound, right lower leg, initial encounter: Secondary | ICD-10-CM

## 2018-09-03 NOTE — Therapy (Signed)
[place], [place], [address] Phone: [phone]   Fax:  [phone]  Physical Therapy Treatment  Patient Details  Name: William Sandoval MRN: [id number] Date of Birth: 1946-11-21 Referring Provider (PT): Celene Squibb, MD   Encounter Date: 09/03/2018  PT End of Session - 09/03/18 1554    Visit Number  2    Number of Visits  13    Date for PT Re-Evaluation  10/14/18    Authorization Type  [employer] (no auth required, visits on medical necessity)    Authorization Time Period  09/02/18 - 10/16/18    Authorization - Visit Number  2    Authorization - Number of Visits  13    PT Start Time  1435    PT Stop Time  1505    PT Time Calculation (min)  30 min    Activity Tolerance  Patient tolerated treatment well    Behavior During Therapy  [facility] for tasks assessed/performed       Past Medical History:  Diagnosis Date  . Chronic atrial fibrillation   . Dyslipidemia   . Epistaxis   . Hypertension     Past Surgical History:  Procedure Laterality Date  . SHOULDER SURGERY     Right    There were no vitals filed for this visit.                  Wound Therapy - 09/03/18 1526    Subjective  Pt states it has felt better and has been sleeping    Patient and Family Stated Goals  wound to heal    Date of Onset  08/18/18   approximate   Prior Treatments  stitches by Dr. [place] Crane    Pain Scale  0-10    Pain Score  0-No pain    Evaluation and Treatment Procedures Explained to Patient/Family  Yes    Evaluation and Treatment Procedures  agreed to    Wound Properties Date First Assessed: 09/02/18 Time First Assessed: 0910 Wound Type: Laceration Location: Leg Location Orientation: Right;Lower;Posterior Wound Description (Comments): laceration of Rt calf Present on Admission: Yes   Dressing Type  Alginate;Compression wrap    Dressing Changed  Changed    Dressing Status  Old drainage    Dressing Change  Frequency  PRN    Site / Wound Assessment  Red;Yellow;Brown    % Wound base Red or Granulating  60%    % Wound base Yellow/Fibrinous Exudate  20%    % Wound base Other/Granulation Tissue (Comment)  20%   brown tissue   Peri-wound Assessment  Intact;Edema;Purple;Maceration   patient believes he had a reaction to bandage adhesives   Margins  Unattached edges (unapproximated)    Closure  None    Drainage Amount  Moderate    Drainage Description  Serous    Treatment  Cleansed;Debridement (Selective)    Selective Debridement - Location  wound bed and margins    Selective Debridement - Tools Used  Forceps    Selective Debridement - Tissue Removed  slough, necrotice devitalized tissue    Wound Therapy - Clinical Statement  Noted maceration upon removal of dressings, however no sign/symptoms of infection and very little edema perimeter of wound.  Most distal aspect granulating well with removal of slough mainly around stitches.  Fluid mostly coming from most proximal aspect where wound  is stitched.  Cleansed LE well and moisturized prior to redressing. Added xeroform  to most distal aspect and continued with alginate on remainder.  Xeroform also applied to small wound on anteiror aspect of LE.  Profore continued for compression.      Wound Therapy - Functional Problem List  difficulty walking, pain    Factors Delaying/Impairing Wound Healing  Infection - systemic/local   currently on blood thinners for chronic A-fib   Hydrotherapy Plan  Debridement;Dressing change;Patient/family education;Pulsatile lavage with suction    Wound Therapy - Frequency  2X / week    Wound Therapy - Current Recommendations  PT    Wound Plan  Measure wound weekly and continue with alginate while patient is on antibiotics. Follow up with MD about stitch removal. Continue with compression wrapping.     Dressing   alginate over stitched area, xeroform distal opened area, profore wrap                 PT Short Term  Goals - 09/02/18 1807      PT SHORT TERM GOAL #1   Title  Patient will verbalize signs/symptoms of local and systemic infection to improve safety and awareness of risk of infection with open wound and when to seek medical attention.    Time  2    Period  Weeks    Status  New    Target Date  09/16/18        PT Long Term Goals - 09/02/18 1807      PT LONG TERM GOAL #1   Title  Wound will decresaed by 75% in size to demonstrate wound healing/approximation.    Time  6    Period  Weeks    Status  New    Target Date  10/14/18      PT LONG TERM GOAL #2   Title  Wound will have 100% granulation tissue to improve healthy environment of wound bed and promote healing/wound closure.    Time  6    Period  Weeks    Status  New              Patient will benefit from skilled therapeutic intervention in order to improve the following deficits and impairments:     Visit Diagnosis: Laceration of skin of right lower leg, initial encounter  Open wound of right lower leg, initial encounter  Localized edema     Problem List Patient Active Problem List   Diagnosis Date Noted  . Long term (current) use of anticoagulants 03/14/2011  . HYPOTHYROIDISM 12/26/2009  . HYPERLIPIDEMIA 12/26/2009  . ATRIAL FIBRILLATION 12/26/2009  . SHOULDER PAIN, LEFT 12/26/2009   Teena Irani, PTA/CLT [phone]  Teena Irani 09/03/2018, 3:55 PM  [place], [place], [address] Phone: [phone]   Fax:  [phone]  Name: William Sandoval MRN: [id number] Date of Birth: 05-Jun-1946

## 2018-09-08 ENCOUNTER — Ambulatory Visit (HOSPITAL_COMMUNITY): Payer: Medicare HMO | Admitting: Physical Therapy

## 2018-09-08 DIAGNOSIS — R600 Localized edema: Secondary | ICD-10-CM | POA: Diagnosis not present

## 2018-09-08 DIAGNOSIS — S81801A Unspecified open wound, right lower leg, initial encounter: Secondary | ICD-10-CM | POA: Diagnosis not present

## 2018-09-08 DIAGNOSIS — S81811A Laceration without foreign body, right lower leg, initial encounter: Secondary | ICD-10-CM

## 2018-09-08 DIAGNOSIS — Z4802 Encounter for removal of sutures: Secondary | ICD-10-CM | POA: Diagnosis not present

## 2018-09-08 DIAGNOSIS — S81811D Laceration without foreign body, right lower leg, subsequent encounter: Secondary | ICD-10-CM | POA: Diagnosis not present

## 2018-09-08 NOTE — Therapy (Signed)
[place], [place], [address] Phone: [phone]   Fax:  [phone]  Wound Care Therapy  Patient Details  Name: William Sandoval MRN: [id number] Date of Birth: [birth year] Referring Provider (PT): Celene Squibb, MD   Encounter Date: 09/08/2018    Past Medical History:  Diagnosis Date  . Chronic atrial fibrillation   . Dyslipidemia   . Epistaxis   . Hypertension     Past Surgical History:  Procedure Laterality Date  . SHOULDER SURGERY     Right    There were no vitals filed for this visit.              Wound Therapy - 09/08/18 0901    Subjective  Pt states it was itching so bad that he stuck a butter knife down into it.  States it also drained through the bandages.     Patient and Family Stated Goals  wound to heal    Date of Onset  08/18/18   approximate   Prior Treatments  stitches by Dr. [place] Crane    Pain Scale  0-10    Pain Score  0-No pain    Evaluation and Treatment Procedures Explained to Patient/Family  Yes    Evaluation and Treatment Procedures  agreed to    Wound Properties Date First Assessed: 09/02/18 Time First Assessed: 0910 Wound Type: Laceration Location: Leg Location Orientation: Right;Lower;Posterior Wound Description (Comments): laceration of Rt calf Present on Admission: Yes   Dressing Type  Alginate;Compression wrap    Dressing Changed  Changed    Dressing Status  Old drainage    Dressing Change Frequency  PRN    Site / Wound Assessment  Red;Yellow;Brown    % Wound base Red or Granulating  75%    % Wound base Yellow/Fibrinous Exudate  25%    % Wound base Other/Granulation Tissue (Comment)  0%   brown tissue   Peri-wound Assessment  Intact;Edema;Purple;Maceration   patient believes he had a reaction to bandage adhesives   Wound Length (cm)  1 cm    Wound Width (cm)  6.8 cm    Wound Depth (cm)  0.2 cm    Wound Volume (cm^3)  1.36 cm^3    Wound Surface Area (cm^2)  6.8 cm^2     Margins  Unattached edges (unapproximated)    Closure  None    Drainage Amount  Moderate    Drainage Description  Serous    Treatment  Cleansed;Debridement (Selective)    Selective Debridement - Location  wound bed and margins    Selective Debridement - Tools Used  Forceps    Selective Debridement - Tissue Removed  slough, necrotice devitalized tissue    Wound Therapy - Clinical Statement  Educated patient on importance of not scratching or sticking objects down into dressing.  suggested taking benadryl instead (if OK with other meds; ask his doctor).   Urged patient to contact MD regarding stitch removal and if antibiotics need to be refilled.  Also noted profore pushed down from attempted scratching causing "bottle necking" compression.  Discussed how this impairs flow/increases congestion and irritation to patient.  Overall, wound looks more approximated and distal end coming together with increased granulation. Most proximal end continues to drain moderately, constantly serous fluid.  cleansed well and moisturized perimeter.  continued with alginate and xeroform at most distal end.  Held profore this session, only using kerlix and #6 netting.  If swelling increased next visit, may want  to resume profore, otherwise continue with kerlix and netting only.  Wound dressing secured underneath with medipore.  Place on anterior ankle is now raw but no signs of infection.  cleansed and covered this with xeroform.     Wound Therapy - Functional Problem List  difficulty walking, pain    Factors Delaying/Impairing Wound Healing  Infection - systemic/local   currently on blood thinners for chronic A-fib   Hydrotherapy Plan  Debridement;Dressing change;Patient/family education;Pulsatile lavage with suction    Wound Therapy - Frequency  2X / week    Wound Therapy - Current Recommendations  PT    Wound Plan  Measure wound weekly and continue with alginate while patient is on antibiotics. Follow up with MD  about stitch removal. Resume compression wrapping if edema increased next session.    Dressing   alginate over stitched area, xeroform distal opened area, profore wrap              PT Education - 09/08/18 0932    Education Details  see assessment    Person(s) Educated  Patient    Methods  Explanation    Comprehension  Verbalized understanding       PT Short Term Goals - 09/02/18 1807      PT SHORT TERM GOAL #1   Title  Patient will verbalize signs/symptoms of local and systemic infection to improve safety and awareness of risk of infection with open wound and when to seek medical attention.    Time  2    Period  Weeks    Status  New    Target Date  09/16/18        PT Long Term Goals - 09/02/18 1807      PT LONG TERM GOAL #1   Title  Wound will decresaed by 75% in size to demonstrate wound healing/approximation.    Time  6    Period  Weeks    Status  New    Target Date  10/14/18      PT LONG TERM GOAL #2   Title  Wound will have 100% granulation tissue to improve healthy environment of wound bed and promote healing/wound closure.    Time  6    Period  Weeks    Status  New              Patient will benefit from skilled therapeutic intervention in order to improve the following deficits and impairments:     Visit Diagnosis: Laceration of skin of right lower leg, initial encounter  Open wound of right lower leg, initial encounter  Localized edema     Problem List Patient Active Problem List   Diagnosis Date Noted  . Long term (current) use of anticoagulants 03/14/2011  . HYPOTHYROIDISM 12/26/2009  . HYPERLIPIDEMIA 12/26/2009  . ATRIAL FIBRILLATION 12/26/2009  . SHOULDER PAIN, LEFT 12/26/2009   Teena Irani, PTA/CLT [phone]  Teena Irani 09/08/2018, 9:33 AM  [place], [place], [address] Phone: [phone]   Fax:  [phone]  Name: William Sandoval MRN:  [id number] Date of Birth: Mar 16, 1946

## 2018-09-10 ENCOUNTER — Ambulatory Visit (HOSPITAL_COMMUNITY): Payer: Medicare HMO | Admitting: Physical Therapy

## 2018-09-10 DIAGNOSIS — S81801A Unspecified open wound, right lower leg, initial encounter: Secondary | ICD-10-CM

## 2018-09-10 DIAGNOSIS — S81811A Laceration without foreign body, right lower leg, initial encounter: Secondary | ICD-10-CM

## 2018-09-10 DIAGNOSIS — R600 Localized edema: Secondary | ICD-10-CM | POA: Diagnosis not present

## 2018-09-10 NOTE — Therapy (Signed)
[place], [place], [address] Phone: [phone]   Fax:  [phone]  Wound Care Therapy  Patient Details  Name: William Sandoval MRN: [id number] Date of Birth: 1946/04/07 Referring Provider (PT): Celene Squibb, MD   Encounter Date: 09/10/2018    Past Medical History:  Diagnosis Date  . Chronic atrial fibrillation   . Dyslipidemia   . Epistaxis   . Hypertension     Past Surgical History:  Procedure Laterality Date  . SHOULDER SURGERY     Right    There were no vitals filed for this visit.              Wound Therapy - 09/10/18 0940    Subjective  Pt went to MD after leaving here last appointment and they removed his stitches and ordered more antibiotics.  Reports no itching, pain or issues.  States he can tell the drainage has decreased.     Patient and Family Stated Goals  wound to heal    Date of Onset  08/18/18   approximate   Prior Treatments  stitches by Dr. [place] Crane    Pain Scale  0-10    Pain Score  0-No pain    Evaluation and Treatment Procedures Explained to Patient/Family  Yes    Evaluation and Treatment Procedures  agreed to    Wound Properties Date First Assessed: 09/02/18 Time First Assessed: 0910 Wound Type: Laceration Location: Leg Location Orientation: Right;Lower;Posterior Wound Description (Comments): laceration of Rt calf Present on Admission: Yes   Dressing Type  Alginate;Compression wrap    Dressing Changed  Changed    Dressing Status  Old drainage    Dressing Change Frequency  PRN    Site / Wound Assessment  Red;Yellow    % Wound base Red or Granulating  85%    % Wound base Yellow/Fibrinous Exudate  15%    % Wound base Other/Granulation Tissue (Comment)  0%   brown tissue   Peri-wound Assessment  Intact;Edema;Purple;Maceration   patient believes he had a reaction to bandage adhesives   Wound Length (cm)  1 cm   3 cm at most distal end but more shallow   Wound Width (cm)   6 cm   diagonally   Wound Depth (cm)  0.2 cm    Wound Volume (cm^3)  1.2 cm^3    Wound Surface Area (cm^2)  6 cm^2    Margins  Epibole (rolled edges)   inferior border attached, superior border epibole   Closure  None    Drainage Amount  Moderate    Drainage Description  Serous    Treatment  Cleansed;Debridement (Selective)    Selective Debridement - Location  wound bed and margins    Selective Debridement - Tools Used  Forceps    Selective Debridement - Tissue Removed  slough, necrotice devitalized tissue    Wound Therapy - Clinical Statement  wound with noted reduction in drainage, however most proximal end site of majority of drainage. Able to remove most slough in wound bed revealing improved granualtion and approximation of inferior border.  Superior appears epibole as this was the "flap" side of the wound.  No edema returned in distal LE so doing well without profore.  conitnued with silver hydro to proximal end and xeroform to distal end of wound.  ABD pad used to absorb drainage.  Anterior area covered with xeroform, appears to be healing.     Wound Therapy - Functional Problem  List  difficulty walking, pain    Factors Delaying/Impairing Wound Healing  Infection - systemic/local   currently on blood thinners for chronic A-fib   Hydrotherapy Plan  Debridement;Dressing change;Patient/family education;Pulsatile lavage with suction    Wound Therapy - Frequency  2X / week    Wound Therapy - Current Recommendations  PT    Wound Plan  Measure wound weekly and continue with appropriate dressing to encourage healing environoment. Resume compression wrapping if edema returns.    Dressing   alginate over stitched area, xeroform distal opened area, profore wrap                PT Short Term Goals - 09/02/18 1807      PT SHORT TERM GOAL #1   Title  Patient will verbalize signs/symptoms of local and systemic infection to improve safety and awareness of risk of infection with open wound  and when to seek medical attention.    Time  2    Period  Weeks    Status  New    Target Date  09/16/18        PT Long Term Goals - 09/02/18 1807      PT LONG TERM GOAL #1   Title  Wound will decresaed by 75% in size to demonstrate wound healing/approximation.    Time  6    Period  Weeks    Status  New    Target Date  10/14/18      PT LONG TERM GOAL #2   Title  Wound will have 100% granulation tissue to improve healthy environment of wound bed and promote healing/wound closure.    Time  6    Period  Weeks    Status  New              Patient will benefit from skilled therapeutic intervention in order to improve the following deficits and impairments:     Visit Diagnosis: No diagnosis found.     Problem List Patient Active Problem List   Diagnosis Date Noted  . Long term (current) use of anticoagulants 03/14/2011  . HYPOTHYROIDISM 12/26/2009  . HYPERLIPIDEMIA 12/26/2009  . ATRIAL FIBRILLATION 12/26/2009  . SHOULDER PAIN, LEFT 12/26/2009   Teena Irani, PTA/CLT [phone]  Teena Irani 09/10/2018, 10:12 AM  [place], [place], [address] Phone: [phone]   Fax:  [phone]  Name: William Sandoval MRN: [id number] Date of Birth: Mar 14, 1946

## 2018-09-15 ENCOUNTER — Ambulatory Visit (HOSPITAL_COMMUNITY): Payer: Medicare HMO

## 2018-09-15 ENCOUNTER — Encounter (HOSPITAL_COMMUNITY): Payer: Self-pay

## 2018-09-15 DIAGNOSIS — S81811A Laceration without foreign body, right lower leg, initial encounter: Secondary | ICD-10-CM

## 2018-09-15 DIAGNOSIS — S81801A Unspecified open wound, right lower leg, initial encounter: Secondary | ICD-10-CM

## 2018-09-15 DIAGNOSIS — R600 Localized edema: Secondary | ICD-10-CM

## 2018-09-15 NOTE — Therapy (Signed)
[place], [place], [address] Phone: [phone]   Fax:  [phone]  Wound Care Therapy  Patient Details  Name: William Sandoval MRN: [id number] Date of Birth: 19-Oct-1946 Referring Provider (PT): Celene Squibb, MD   Encounter Date: 09/15/2018  PT End of Session - 09/15/18 1031    Visit Number  3    Number of Visits  13    Date for PT Re-Evaluation  10/14/18    Authorization Type  [employer] (no auth required, visits on medical necessity)    Authorization Time Period  09/02/18 - 10/16/18    Authorization - Visit Number  3    Authorization - Number of Visits  13    PT Start Time  0949    PT Stop Time  1027    PT Time Calculation (min)  38 min    Activity Tolerance  Patient tolerated treatment well    Behavior During Therapy  [facility] for tasks assessed/performed       Past Medical History:  Diagnosis Date  . Chronic atrial fibrillation   . Dyslipidemia   . Epistaxis   . Hypertension     Past Surgical History:  Procedure Laterality Date  . SHOULDER SURGERY     Right    There were no vitals filed for this visit.   Subjective Assessment - 09/15/18 1129    Subjective  Pt arrived wiht new dressings on LE, no real pain does have some itchiness to it                Wound Therapy - 09/15/18 1130    Subjective  Pt arrived wiht new dressings on LE, no real pain does have some itchiness to it    Patient and Family Stated Goals  wound to heal    Date of Onset  08/18/18   approximate   Prior Treatments  stitches by Dr. [place] Crane    Pain Scale  0-10   itchy   Evaluation and Treatment Procedures Explained to Patient/Family  Yes    Evaluation and Treatment Procedures  agreed to    Wound Properties Date First Assessed: 09/02/18 Time First Assessed: 0910 Wound Type: Laceration Location: Leg Location Orientation: Right;Lower;Posterior Wound Description (Comments): laceration of Rt calf Present on  Admission: Yes   Dressing Type  Impregnated gauze (bismuth);Silver hydrofiber;Alginate;Abdominal pads;Compression wrap   silverhydrofiber superior, xeroform w/alginate profore   Dressing Changed  Changed    Dressing Status  Old drainage    Dressing Change Frequency  PRN    Site / Wound Assessment  Red;Yellow    % Wound base Red or Granulating  85%    % Wound base Yellow/Fibrinous Exudate  15%    % Wound base Black/Eschar  0%    % Wound base Other/Granulation Tissue (Comment)  0%    Peri-wound Assessment  Intact;Edema;Purple;Maceration    Margins  Epibole (rolled edges)    Closure  None    Drainage Amount  Moderate    Drainage Description  Serous    Treatment  Cleansed;Debridement (Selective)    Selective Debridement - Location  wound bed and margins    Selective Debridement - Tools Used  Forceps;Scalpel    Selective Debridement - Tissue Removed  slough, necrotice devitalized tissue    Wound Therapy - Clinical Statement  Pt arrived with new bandaids over wounds.  Upon removal noted increased maceration and decreased skin integrity.  Pt explained importance of keeping  dressings on until next session unless get wet or increased pain with wearing.  Selective debridement for removal of yellow and green puss/slough over superior wound bed.  Pt reports he is now done wiht antibiotics.  Applied vaseline perimeter of wound to encourage proper skin intergity and reduce c/o itching.  Continued wiht silverhydrofiber on superior wound and xeroform on inferior with additional alginate and ABD pad to address drainage.  Was difficult to assess drainage amount this session as pt wearing fresh bandaid.  Did resume profore dressings for edema control and LE cleaniness.    Wound Therapy - Functional Problem List  difficulty walking, pain    Factors Delaying/Impairing Wound Healing  Infection - systemic/local    Hydrotherapy Plan  Debridement;Dressing change;Patient/family education;Pulsatile lavage with suction     Wound Therapy - Frequency  2X / week    Wound Therapy - Current Recommendations  PT    Wound Plan  Measure wound weekly and continue with appropriate dressing to encourage healing environoment. Resume compression wrapping if edema returns.    Dressing   silverhydrofiber over stitched area, xeroform distal opened area, alginate over all with profore wrap                PT Short Term Goals - 09/02/18 1807      PT SHORT TERM GOAL #1   Title  Patient will verbalize signs/symptoms of local and systemic infection to improve safety and awareness of risk of infection with open wound and when to seek medical attention.    Time  2    Period  Weeks    Status  New    Target Date  09/16/18        PT Long Term Goals - 09/02/18 1807      PT LONG TERM GOAL #1   Title  Wound will decresaed by 75% in size to demonstrate wound healing/approximation.    Time  6    Period  Weeks    Status  New    Target Date  10/14/18      PT LONG TERM GOAL #2   Title  Wound will have 100% granulation tissue to improve healthy environment of wound bed and promote healing/wound closure.    Time  6    Period  Weeks    Status  New              Patient will benefit from skilled therapeutic intervention in order to improve the following deficits and impairments:     Visit Diagnosis: Laceration of skin of right lower leg, initial encounter  Open wound of right lower leg, initial encounter  Localized edema     Problem List Patient Active Problem List   Diagnosis Date Noted  . Long term (current) use of anticoagulants 03/14/2011  . HYPOTHYROIDISM 12/26/2009  . HYPERLIPIDEMIA 12/26/2009  . ATRIAL FIBRILLATION 12/26/2009  . SHOULDER PAIN, LEFT 12/26/2009   William Sandoval, [place]; [place]  Aldona Lento 09/15/2018, 11:49 AM  [place], [place], [address] Phone: [phone]   Fax:   [phone]  Name: William Sandoval MRN: [id number] Date of Birth: 1946-03-18

## 2018-09-17 ENCOUNTER — Ambulatory Visit (HOSPITAL_COMMUNITY): Payer: Medicare HMO | Admitting: Physical Therapy

## 2018-09-17 DIAGNOSIS — S81811A Laceration without foreign body, right lower leg, initial encounter: Secondary | ICD-10-CM | POA: Diagnosis not present

## 2018-09-17 DIAGNOSIS — S81801A Unspecified open wound, right lower leg, initial encounter: Secondary | ICD-10-CM | POA: Diagnosis not present

## 2018-09-17 DIAGNOSIS — R600 Localized edema: Secondary | ICD-10-CM

## 2018-09-17 NOTE — Therapy (Signed)
[place], [place], [address] Phone: [phone]   Fax:  [phone]  Wound Care Therapy  Patient Details  Name: William Sandoval MRN: [id number] Date of Birth: 10/02/1946 Referring Provider (PT): Celene Squibb, MD   Encounter Date: 09/17/2018  PT End of Session - 09/17/18 1149    Visit Number  4    Number of Visits  13    Date for PT Re-Evaluation  10/14/18    Authorization Type  [employer] (no auth required, visits on medical necessity)    Authorization Time Period  09/02/18 - 10/16/18    Authorization - Visit Number  4    Authorization - Number of Visits  13    PT Start Time  6962    PT Stop Time  1105    PT Time Calculation (min)  30 min    Activity Tolerance  Patient tolerated treatment well    Behavior During Therapy  [facility] for tasks assessed/performed       Past Medical History:  Diagnosis Date  . Chronic atrial fibrillation   . Dyslipidemia   . Epistaxis   . Hypertension     Past Surgical History:  Procedure Laterality Date  . SHOULDER SURGERY     Right    There were no vitals filed for this visit.              Wound Therapy - 09/17/18 1144    Subjective  Pt with dressing intact.  Reports no issues or pain.    Patient and Family Stated Goals  wound to heal    Date of Onset  08/18/18   approximate   Prior Treatments  stitches by Dr. [place] Crane    Pain Score  0-No pain    Evaluation and Treatment Procedures Explained to Patient/Family  Yes    Evaluation and Treatment Procedures  agreed to    Wound Properties Date First Assessed: 09/02/18 Time First Assessed: 0910 Wound Type: Laceration Location: Leg Location Orientation: Right;Lower;Posterior Wound Description (Comments): laceration of Rt calf Present on Admission: Yes   Dressing Type  Impregnated gauze (bismuth);Silver hydrofiber;Alginate;Abdominal pads;Compression wrap   silverhydrofiber superior, xeroform w/alginate profore   Dressing Changed  Changed    Dressing Status  Old drainage    Dressing Change Frequency  PRN    Site / Wound Assessment  Red;Yellow    % Wound base Red or Granulating  90%    % Wound base Yellow/Fibrinous Exudate  10%    % Wound base Black/Eschar  0%    % Wound base Other/Granulation Tissue (Comment)  0%    Peri-wound Assessment  Intact;Edema;Maceration    Margins  Epibole (rolled edges)   superior border rolled edges   Closure  None    Drainage Amount  Minimal    Drainage Description  Serous    Treatment  Cleansed;Debridement (Selective)    Selective Debridement - Location  wound bed and margins    Selective Debridement - Tools Used  Forceps;Scalpel    Selective Debridement - Tissue Removed  slough, necrotice devitalized tissue    Wound Therapy - Clinical Statement  much improved at incision with noted increased approximation and granualtion.  Perimeter of wound blistery with increased edema, however no redness or heat.  do not suspect infecion or cellulitis, only irritaiton due to increased drainage/moisture previously.  cleansed area well and debrided away slough and devitatlized tissue.  moisturzied well prior to redressing with profore.  Xeroform used in incision.  silver hydrofiber used over this and surrounding area.      Wound Therapy - Functional Problem List  difficulty walking, pain    Factors Delaying/Impairing Wound Healing  Infection - systemic/local    Hydrotherapy Plan  Debridement;Dressing change;Patient/family education;Pulsatile lavage with suction    Wound Therapy - Frequency  2X / week    Wound Therapy - Current Recommendations  PT    Wound Plan  Measure wound weekly and continue with appropriate dressing to encourage healing environoment. continue with profore.     Dressing   xeroform over stitched area, silver hydrofiber over this, profore wrap                PT Short Term Goals - 09/02/18 1807      PT SHORT TERM GOAL #1   Title  Patient will verbalize  signs/symptoms of local and systemic infection to improve safety and awareness of risk of infection with open wound and when to seek medical attention.    Time  2    Period  Weeks    Status  New    Target Date  09/16/18        PT Long Term Goals - 09/02/18 1807      PT LONG TERM GOAL #1   Title  Wound will decresaed by 75% in size to demonstrate wound healing/approximation.    Time  6    Period  Weeks    Status  New    Target Date  10/14/18      PT LONG TERM GOAL #2   Title  Wound will have 100% granulation tissue to improve healthy environment of wound bed and promote healing/wound closure.    Time  6    Period  Weeks    Status  New              Patient will benefit from skilled therapeutic intervention in order to improve the following deficits and impairments:     Visit Diagnosis: Laceration of skin of right lower leg, initial encounter  Open wound of right lower leg, initial encounter  Localized edema     Problem List Patient Active Problem List   Diagnosis Date Noted  . Long term (current) use of anticoagulants 03/14/2011  . HYPOTHYROIDISM 12/26/2009  . HYPERLIPIDEMIA 12/26/2009  . ATRIAL FIBRILLATION 12/26/2009  . SHOULDER PAIN, LEFT 12/26/2009    Roseanne Reno B 09/17/2018, 11:50 AM  [place], [place], [address] Phone: [phone]   Fax:  [phone]  Name: William Sandoval MRN: [id number] Date of Birth: Aug 15, 1946

## 2018-09-22 ENCOUNTER — Encounter (HOSPITAL_COMMUNITY): Payer: Self-pay

## 2018-09-22 ENCOUNTER — Ambulatory Visit (HOSPITAL_COMMUNITY): Payer: Medicare HMO

## 2018-09-22 DIAGNOSIS — R600 Localized edema: Secondary | ICD-10-CM

## 2018-09-22 DIAGNOSIS — S81801A Unspecified open wound, right lower leg, initial encounter: Secondary | ICD-10-CM

## 2018-09-22 DIAGNOSIS — S81811A Laceration without foreign body, right lower leg, initial encounter: Secondary | ICD-10-CM | POA: Diagnosis not present

## 2018-09-22 DIAGNOSIS — E039 Hypothyroidism, unspecified: Secondary | ICD-10-CM | POA: Diagnosis not present

## 2018-09-22 DIAGNOSIS — I10 Essential (primary) hypertension: Secondary | ICD-10-CM | POA: Diagnosis not present

## 2018-09-22 NOTE — Therapy (Signed)
[place], [place], [address] Phone: [phone]   Fax:  [phone]  Wound Care Therapy  Patient Details  Name: William Sandoval MRN: [id number] Date of Birth: 1946-07-07 Referring Provider (PT): Celene Squibb, MD   Encounter Date: 09/22/2018  PT End of Session - 09/22/18 1258    Visit Number  5    Number of Visits  13    Date for PT Re-Evaluation  10/14/18    Authorization Type  [employer] (no auth required, visits on medical necessity)    Authorization Time Period  09/02/18 - 10/16/18    Authorization - Visit Number  5    Authorization - Number of Visits  13    PT Start Time  1517    PT Stop Time  1108    PT Time Calculation (min)  33 min    Activity Tolerance  Patient tolerated treatment well    Behavior During Therapy  [facility] for tasks assessed/performed       Past Medical History:  Diagnosis Date  . Chronic atrial fibrillation   . Dyslipidemia   . Epistaxis   . Hypertension     Past Surgical History:  Procedure Laterality Date  . SHOULDER SURGERY     Right    There were no vitals filed for this visit.   Subjective Assessment - 09/22/18 1112    Subjective  Pt arrived with dressing intact, no reports of pain does c/o some itch                Wound Therapy - 09/22/18 1112    Subjective  Pt arrived with dressing intact, no reports of pain does c/o some itch    Patient and Family Stated Goals  wound to heal    Date of Onset  08/18/18   approximate   Prior Treatments  stitches by Dr. [place] Crane    Pain Scale  0-10    Pain Score  0-No pain    Evaluation and Treatment Procedures Explained to Patient/Family  Yes    Evaluation and Treatment Procedures  agreed to    Wound Properties Date First Assessed: 09/02/18 Time First Assessed: 0910 Wound Type: Laceration Location: Leg Location Orientation: Right;Lower;Posterior Wound Description (Comments): laceration of Rt calf Present on Admission:  Yes   Dressing Type  Impregnated gauze (bismuth);Silver hydrofiber;Abdominal pads;Compression wrap   xeroform over incision, silverhydrofiber, ABD, profore   Dressing Changed  Changed    Dressing Status  Old drainage    Dressing Change Frequency  PRN    Site / Wound Assessment  Red;Yellow    % Wound base Red or Granulating  95%    % Wound base Yellow/Fibrinous Exudate  5%    Peri-wound Assessment  Intact;Edema;Maceration    Wound Length (cm)  1 cm    Wound Width (cm)  6 cm    Wound Depth (cm)  0.1 cm    Wound Volume (cm^3)  0.6 cm^3    Wound Surface Area (cm^2)  6 cm^2    Margins  Epibole (rolled edges)    Closure  None    Drainage Amount  Minimal    Drainage Description  Serous    Treatment  Cleansed;Debridement (Selective)    Selective Debridement - Location  wound bed and margins    Selective Debridement - Tools Used  Forceps;Scalpel    Selective Debridement - Tissue Removed  slough, necrotice devitalized tissue    Wound Therapy -  Clinical Statement  Vast improvements with approximation and granulaitn over incision.  Pt continued to have blistering due to maceration with drainage.  Continued with xeroform over incision and silver hydrofiber with ABD to address draininage.  Profore for edema control.    Wound Therapy - Functional Problem List  difficulty walking, pain    Factors Delaying/Impairing Wound Healing  Infection - systemic/local    Hydrotherapy Plan  Debridement;Dressing change;Patient/family education;Pulsatile lavage with suction    Wound Therapy - Frequency  2X / week    Wound Therapy - Current Recommendations  PT    Wound Plan  Measure wound weekly and continue with appropriate dressing to encourage healing environoment. continue with profore.     Dressing   xeroform over stitched area, silver hydrofiber over this, profore wrap                PT Short Term Goals - 09/02/18 1807      PT SHORT TERM GOAL #1   Title  Patient will verbalize signs/symptoms of  local and systemic infection to improve safety and awareness of risk of infection with open wound and when to seek medical attention.    Time  2    Period  Weeks    Status  New    Target Date  09/16/18        PT Long Term Goals - 09/02/18 1807      PT LONG TERM GOAL #1   Title  Wound will decresaed by 75% in size to demonstrate wound healing/approximation.    Time  6    Period  Weeks    Status  New    Target Date  10/14/18      PT LONG TERM GOAL #2   Title  Wound will have 100% granulation tissue to improve healthy environment of wound bed and promote healing/wound closure.    Time  6    Period  Weeks    Status  New              Patient will benefit from skilled therapeutic intervention in order to improve the following deficits and impairments:     Visit Diagnosis: Laceration of skin of right lower leg, initial encounter  Open wound of right lower leg, initial encounter  Localized edema     Problem List Patient Active Problem List   Diagnosis Date Noted  . Long term (current) use of anticoagulants 03/14/2011  . HYPOTHYROIDISM 12/26/2009  . HYPERLIPIDEMIA 12/26/2009  . ATRIAL FIBRILLATION 12/26/2009  . SHOULDER PAIN, LEFT 12/26/2009   Ihor Austin, [place]; [place]  Aldona Lento 09/22/2018, 12:59 PM  [place], [place], [address] Phone: [phone]   Fax:  [phone]  Name: William Sandoval MRN: [id number] Date of Birth: 02-17-1946

## 2018-09-24 ENCOUNTER — Other Ambulatory Visit: Payer: Self-pay

## 2018-09-24 ENCOUNTER — Encounter (HOSPITAL_COMMUNITY): Payer: Self-pay

## 2018-09-24 ENCOUNTER — Ambulatory Visit (HOSPITAL_COMMUNITY): Payer: Medicare HMO

## 2018-09-24 DIAGNOSIS — S81811A Laceration without foreign body, right lower leg, initial encounter: Secondary | ICD-10-CM

## 2018-09-24 DIAGNOSIS — R600 Localized edema: Secondary | ICD-10-CM | POA: Diagnosis not present

## 2018-09-24 DIAGNOSIS — S81801A Unspecified open wound, right lower leg, initial encounter: Secondary | ICD-10-CM

## 2018-09-24 NOTE — Therapy (Signed)
[place], [place], [address] Phone: [phone]   Fax:  [phone]  Wound Care Therapy  Patient Details  Name: William Sandoval MRN: [id number] Date of Birth: Nov 20, 1946 Referring Provider (PT): Celene Squibb, MD   Encounter Date: 09/24/2018  PT End of Session - 09/24/18 0901    Visit Number  6    Number of Visits  13    Date for PT Re-Evaluation  10/14/18    Authorization Type  [employer] (no auth required, visits on medical necessity)    Authorization Time Period  09/02/18 - 10/16/18    Authorization - Visit Number  6    Authorization - Number of Visits  13    PT Start Time  0815    PT Stop Time  0850    PT Time Calculation (min)  35 min    Activity Tolerance  Patient tolerated treatment well    Behavior During Therapy  [facility] for tasks assessed/performed       Past Medical History:  Diagnosis Date  . Chronic atrial fibrillation   . Dyslipidemia   . Epistaxis   . Hypertension     Past Surgical History:  Procedure Laterality Date  . SHOULDER SURGERY     Right    There were no vitals filed for this visit.   Wound Therapy - 09/24/18 0902    Subjective  Patient arrives with dressing intact. Denies scratching his leg but does report he has had some itching and pain occasionally. He reports he is going to his MD tomorrow and is unsure if they will remove his dressing. He will come here after to check in about getting an ealier appointment than Tuesday if they do remove the wrap.     Patient and Family Stated Goals  wound to heal    Date of Onset  08/18/18   approximate   Prior Treatments  stitches by Dr. [place] Crane    Pain Scale  0-10    Pain Score  0-No pain    Evaluation and Treatment Procedures Explained to Patient/Family  Yes    Evaluation and Treatment Procedures  agreed to    Wound Properties Date First Assessed: 09/02/18 Time First Assessed: 0910 Wound Type: Laceration Location: Leg Location  Orientation: Right;Lower;Posterior Wound Description (Comments): laceration of Rt calf Present on Admission: Yes   Dressing Type  Abdominal pads;Compression wrap;Alginate   alginate, ABD, profore wrap, petrolleum applied to periwound   Dressing Changed  Changed    Dressing Status  Old drainage    Dressing Change Frequency  PRN    Site / Wound Assessment  Red;Yellow    % Wound base Red or Granulating  100%    % Wound base Yellow/Fibrinous Exudate  0%    % Wound base Black/Eschar  0%    % Wound base Other/Granulation Tissue (Comment)  0%    Peri-wound Assessment  Edema;Maceration   diffuse macerations with open spots of granulation   Margins  Attached edges (approximated)    Closure  None    Drainage Amount  Moderate    Drainage Description  Serous    Treatment  Cleansed;Debridement (Selective);Pressure applied    Selective Debridement - Location  slough from wound bed of spots, devitalized tissue around margins    Selective Debridement - Tools Used  Forceps    Selective Debridement - Tissue Removed  slough, devitalized tissue    Wound Therapy - Clinical Statement  Posterior leg laceration has improved greatly and is almost completely approximated. However patient continues to have diffuse maceration of periwound primarily distal to laceration with superficial granulated openings throughout. The anterior leg wound also presents with maceration and scattered spots of granulation. Changed topical dressing to alginate as there is not sign of infection and ABD with compression wrap for additional drainage absorption. Applied petroleum to intact periwound to prevent further skin break down. He will continue to benefit from skilled wound care to address skin breakdown and promote healing. He ahs been put on the wait list for Friday and Monday as he is going to the MD tomorrow and is unsure of if they will remove the dressing.     Wound Therapy - Functional Problem List  difficulty walking, pain     Factors Delaying/Impairing Wound Healing  Infection - systemic/local    Hydrotherapy Plan  Debridement;Dressing change;Patient/family education;Pulsatile lavage with suction    Wound Therapy - Frequency  2X / week    Wound Therapy - Current Recommendations  PT    Wound Plan  Measure wound weekly and continue with appropriate dressing to encourage healing environoment. continue with profore.     Dressing   alginate, ABD, profore wrap, petrolleum applied to intact periwound        PT Short Term Goals - 09/24/18 0902      PT SHORT TERM GOAL #1   Title  Patient will verbalize signs/symptoms of local and systemic infection to improve safety and awareness of risk of infection with open wound and when to seek medical attention.    Time  2    Period  Weeks    Status  On-going        PT Long Term Goals - 09/24/18 0902      PT LONG TERM GOAL #1   Title  Wound will decresaed by 75% in size to demonstrate wound healing/approximation.    Time  6    Period  Weeks    Status  On-going      PT LONG TERM GOAL #2   Title  Wound will have 100% granulation tissue to improve healthy environment of wound bed and promote healing/wound closure.    Time  6    Period  Weeks    Status  On-going        Plan - 09/24/18 0901    Clinical Impression Statement  see above    Rehab Potential  Good    PT Frequency  2x / week    PT Duration  6 weeks    PT Treatment/Interventions  ADLs/Self Care Home Management;Taping;Manual techniques;Other (comment)   wound care, cleansing and debridement   PT Next Visit Plan  see above    Consulted and Agree with Plan of Care  Patient       Patient will benefit from skilled therapeutic intervention in order to improve the following deficits and impairments:  Decreased skin integrity, Other (comment), Increased edema(risk of infection)  Visit Diagnosis: Laceration of skin of right lower leg, initial encounter  Open wound of right lower leg, initial  encounter  Localized edema     Problem List Patient Active Problem List   Diagnosis Date Noted  . Long term (current) use of anticoagulants 03/14/2011  . HYPOTHYROIDISM 12/26/2009  . HYPERLIPIDEMIA 12/26/2009  . ATRIAL FIBRILLATION 12/26/2009  . SHOULDER PAIN, LEFT 12/26/2009    Kipp Brood, PT, DPT Physical Therapist with [facility]  09/24/2018 9:14 AM  [place], [place], [address] Phone: [phone]   Fax:  [phone]  Name: William Sandoval MRN: [id number] Date of Birth: January 22, 1946

## 2018-09-25 ENCOUNTER — Encounter

## 2018-09-25 DIAGNOSIS — L259 Unspecified contact dermatitis, unspecified cause: Secondary | ICD-10-CM | POA: Diagnosis not present

## 2018-09-25 DIAGNOSIS — Z6829 Body mass index (BMI) 29.0-29.9, adult: Secondary | ICD-10-CM | POA: Diagnosis not present

## 2018-09-25 DIAGNOSIS — E782 Mixed hyperlipidemia: Secondary | ICD-10-CM | POA: Diagnosis not present

## 2018-09-25 DIAGNOSIS — S81811D Laceration without foreign body, right lower leg, subsequent encounter: Secondary | ICD-10-CM | POA: Diagnosis not present

## 2018-09-25 DIAGNOSIS — I10 Essential (primary) hypertension: Secondary | ICD-10-CM | POA: Diagnosis not present

## 2018-09-25 DIAGNOSIS — E039 Hypothyroidism, unspecified: Secondary | ICD-10-CM | POA: Diagnosis not present

## 2018-09-25 DIAGNOSIS — I4891 Unspecified atrial fibrillation: Secondary | ICD-10-CM | POA: Diagnosis not present

## 2018-09-28 ENCOUNTER — Encounter

## 2018-09-29 ENCOUNTER — Ambulatory Visit (HOSPITAL_COMMUNITY): Payer: Medicare HMO | Admitting: Physical Therapy

## 2018-09-29 DIAGNOSIS — R600 Localized edema: Secondary | ICD-10-CM

## 2018-09-29 DIAGNOSIS — S81811A Laceration without foreign body, right lower leg, initial encounter: Secondary | ICD-10-CM

## 2018-09-29 DIAGNOSIS — S81801A Unspecified open wound, right lower leg, initial encounter: Secondary | ICD-10-CM

## 2018-09-29 NOTE — Therapy (Signed)
[place], [place], [address] Phone: [phone]   Fax:  [phone]  Wound Care Therapy  Patient Details  Name: William Sandoval MRN: [id number] Date of Birth: [birth year] Referring Provider (PT): Celene Squibb, MD   Encounter Date: 09/29/2018  PT End of Session - 09/29/18 0924    Visit Number  7    Number of Visits  13    Date for PT Re-Evaluation  10/14/18    Authorization Type  [employer] (no auth required, visits on medical necessity)    Authorization Time Period  09/02/18 - 10/16/18    Authorization - Visit Number  7    Authorization - Number of Visits  13    PT Start Time  0816    PT Stop Time  0852    PT Time Calculation (min)  36 min    Activity Tolerance  Patient tolerated treatment well    Behavior During Therapy  [facility] for tasks assessed/performed       Past Medical History:  Diagnosis Date  . Chronic atrial fibrillation   . Dyslipidemia   . Epistaxis   . Hypertension     Past Surgical History:  Procedure Laterality Date  . SHOULDER SURGERY     Right    There were no vitals filed for this visit.              Wound Therapy - 09/29/18 0915    Subjective  Pt reports his leg will burn and sting at times. reports he was given some pain meds for this.  STates he's had ongoing skin allergy issues for approx 5 years now.    Patient and Family Stated Goals  wound to heal    Date of Onset  08/18/18   approximate   Prior Treatments  stitches by Dr. [place] Crane    Pain Scale  0-10    Pain Score  0-No pain    Evaluation and Treatment Procedures Explained to Patient/Family  Yes    Evaluation and Treatment Procedures  agreed to    Wound Properties Date First Assessed: 09/02/18 Time First Assessed: 0910 Wound Type: Laceration Location: Leg Location Orientation: Right;Lower;Posterior Wound Description (Comments): laceration of Rt calf Present on Admission: Yes   Dressing Type  Abdominal  pads;Compression wrap;Alginate   alginate, ABD, profore wrap, petrolleum applied to periwound   Dressing Changed  Changed    Dressing Status  Old drainage    Dressing Change Frequency  PRN    Site / Wound Assessment  Red;Yellow    % Wound base Red or Granulating  100%    % Wound base Yellow/Fibrinous Exudate  0%    % Wound base Black/Eschar  0%    % Wound base Other/Granulation Tissue (Comment)  0%    Peri-wound Assessment  Edema;Maceration   diffuse macerations with open spots of granulation   Margins  Attached edges (approximated)    Closure  None    Drainage Amount  Minimal    Drainage Description  Serous    Treatment  Cleansed;Debridement (Selective)    Selective Debridement - Location  slough from wound bed of spots, devitalized tissue around margins    Selective Debridement - Tools Used  Forceps    Selective Debridement - Tissue Removed  slough, devitalized tissue    Wound Therapy - Clinical Statement  Dressings slightly stuck to most lateral border of wound and area in anterior LE.  largest area posterior  with think film of slough, easily debrided away.  Mostly area is red/bumpy in appearance resembling irritation/allergic reaction.  Cleansed well and continued to dress with alginate only.  Vaseline applied to perimeter to LE to relieve dryness/prevent itching.  Pt reported overall comfort with profore dressing.     Wound Therapy - Functional Problem List  difficulty walking, pain    Factors Delaying/Impairing Wound Healing  Infection - systemic/local    Hydrotherapy Plan  Debridement;Dressing change;Patient/family education;Pulsatile lavage with suction    Wound Therapy - Frequency  2X / week    Wound Therapy - Current Recommendations  PT    Wound Plan  Measure wound weekly (thursdays) and continue with appropriate dressing to encourage healing environoment. continue with profore.     Dressing   alginate, ABD, profore wrap, petrolleum applied to intact periwound                 PT Short Term Goals - 09/24/18 0902      PT SHORT TERM GOAL #1   Title  Patient will verbalize signs/symptoms of local and systemic infection to improve safety and awareness of risk of infection with open wound and when to seek medical attention.    Time  2    Period  Weeks    Status  On-going        PT Long Term Goals - 09/24/18 0902      PT LONG TERM GOAL #1   Title  Wound will decresaed by 75% in size to demonstrate wound healing/approximation.    Time  6    Period  Weeks    Status  On-going      PT LONG TERM GOAL #2   Title  Wound will have 100% granulation tissue to improve healthy environment of wound bed and promote healing/wound closure.    Time  6    Period  Weeks    Status  On-going              Patient will benefit from skilled therapeutic intervention in order to improve the following deficits and impairments:     Visit Diagnosis: Laceration of skin of right lower leg, initial encounter  Open wound of right lower leg, initial encounter  Localized edema     Problem List Patient Active Problem List   Diagnosis Date Noted  . Long term (current) use of anticoagulants 03/14/2011  . HYPOTHYROIDISM 12/26/2009  . HYPERLIPIDEMIA 12/26/2009  . ATRIAL FIBRILLATION 12/26/2009  . SHOULDER PAIN, LEFT 12/26/2009   Teena Irani, PTA/CLT [phone]  Teena Irani 09/29/2018, 9:25 AM  [place], [place], [address] Phone: [phone]   Fax:  [phone]  Name: CALEL PISARSKI MRN: [id number] Date of Birth: 10-19-1946

## 2018-10-01 ENCOUNTER — Ambulatory Visit (HOSPITAL_COMMUNITY): Payer: Medicare HMO | Admitting: Physical Therapy

## 2018-10-01 DIAGNOSIS — S81811A Laceration without foreign body, right lower leg, initial encounter: Secondary | ICD-10-CM | POA: Diagnosis not present

## 2018-10-01 DIAGNOSIS — S81801A Unspecified open wound, right lower leg, initial encounter: Secondary | ICD-10-CM

## 2018-10-01 DIAGNOSIS — R600 Localized edema: Secondary | ICD-10-CM | POA: Diagnosis not present

## 2018-10-01 NOTE — Therapy (Signed)
[place], [place], [address] Phone: [phone]   Fax:  [phone]  Wound Care Therapy  Patient Details  Name: William Sandoval MRN: [id number] Date of Birth: 1946/05/28 Referring Provider (PT): Celene Squibb, MD   Encounter Date: 10/01/2018  PT End of Session - 10/01/18 1159    Visit Number  8    Number of Visits  13    Date for PT Re-Evaluation  10/14/18    Authorization Type  [employer] (no auth required, visits on medical necessity)    Authorization Time Period  09/02/18 - 10/16/18    Authorization - Visit Number  8    Authorization - Number of Visits  13    PT Start Time  0904    PT Stop Time  0940    PT Time Calculation (min)  36 min    Activity Tolerance  Patient tolerated treatment well    Behavior During Therapy  [facility] for tasks assessed/performed       Past Medical History:  Diagnosis Date  . Chronic atrial fibrillation   . Dyslipidemia   . Epistaxis   . Hypertension     Past Surgical History:  Procedure Laterality Date  . SHOULDER SURGERY     Right    There were no vitals filed for this visit.              Wound Therapy - 10/01/18 1153    Subjective  Pt states his leg has been itchy.  no pain.    Patient and Family Stated Goals  wound to heal    Date of Onset  08/18/18   approximate   Prior Treatments  stitches by Dr. [place] Crane    Pain Scale  0-10    Pain Score  0-No pain    Evaluation and Treatment Procedures Explained to Patient/Family  Yes    Evaluation and Treatment Procedures  agreed to    Wound Properties Date First Assessed: 09/02/18 Time First Assessed: 0910 Wound Type: Laceration Location: Leg Location Orientation: Right;Lower;Posterior Wound Description (Comments): laceration of Rt calf Present on Admission: Yes   Dressing Type  Abdominal pads;Compression wrap;Alginate   alginate, ABD, profore wrap, petrolleum applied to periwound   Dressing Changed  Changed     Dressing Status  Old drainage    Dressing Change Frequency  PRN    Site / Wound Assessment  Red;Yellow    % Wound base Red or Granulating  100%    % Wound base Yellow/Fibrinous Exudate  0%    % Wound base Black/Eschar  0%    % Wound base Other/Granulation Tissue (Comment)  0%    Peri-wound Assessment  Edema;Maceration   diffuse macerations with open spots of granulation   Wound Length (cm)  0.2 cm   was 1 cm   Wound Width (cm)  0.6 cm   was 6 cm   Wound Depth (cm)  0.1 cm   was 0.2 cm   Wound Volume (cm^3)  0.01 cm^3    Wound Surface Area (cm^2)  0.12 cm^2    Margins  Attached edges (approximated)    Closure  None    Drainage Amount  Minimal    Drainage Description  Serous    Treatment  Cleansed;Debridement (Selective)    Selective Debridement - Location  slough from incision, dry tissue perimeter of incision.    Selective Debridement - Tools Used  Forceps    Selective Debridement -  Tissue Removed  slough, devitalized tissue    Wound Therapy - Clinical Statement  significant improvement from last visit.  only small area remains within incision line with slough.  Debrided this and dry tissue perimeter encompasing area of 10X10cm. This area was macerated, however musch improved and less irritated.      Wound Therapy - Functional Problem List  difficulty walking, pain    Factors Delaying/Impairing Wound Healing  Infection - systemic/local    Hydrotherapy Plan  Debridement;Dressing change;Patient/family education;Pulsatile lavage with suction    Wound Therapy - Frequency  2X / week    Wound Therapy - Current Recommendations  PT    Wound Plan  continue per current POC, keeping perimeter dry and wound moist.     Dressing   alginate, ABD, profore wrap, petrolleum applied to intact periwound                PT Short Term Goals - 09/24/18 0902      PT SHORT TERM GOAL #1   Title  Patient will verbalize signs/symptoms of local and systemic infection to improve safety and  awareness of risk of infection with open wound and when to seek medical attention.    Time  2    Period  Weeks    Status  On-going        PT Long Term Goals - 09/24/18 0902      PT LONG TERM GOAL #1   Title  Wound will decresaed by 75% in size to demonstrate wound healing/approximation.    Time  6    Period  Weeks    Status  On-going      PT LONG TERM GOAL #2   Title  Wound will have 100% granulation tissue to improve healthy environment of wound bed and promote healing/wound closure.    Time  6    Period  Weeks    Status  On-going              Patient will benefit from skilled therapeutic intervention in order to improve the following deficits and impairments:     Visit Diagnosis: Laceration of skin of right lower leg, initial encounter  Open wound of right lower leg, initial encounter  Localized edema     Problem List Patient Active Problem List   Diagnosis Date Noted  . Long term (current) use of anticoagulants 03/14/2011  . HYPOTHYROIDISM 12/26/2009  . HYPERLIPIDEMIA 12/26/2009  . ATRIAL FIBRILLATION 12/26/2009  . SHOULDER PAIN, LEFT 12/26/2009   Teena Irani, PTA/CLT [phone]  Teena Irani 10/01/2018, 12:00 PM  [place] [address] [place], [place], [address] Phone: [phone]   Fax:  [phone]  Name: William Sandoval MRN: [id number] Date of Birth: 1946-07-19

## 2018-10-06 ENCOUNTER — Encounter (HOSPITAL_COMMUNITY): Payer: Self-pay

## 2018-10-06 ENCOUNTER — Ambulatory Visit (HOSPITAL_COMMUNITY): Payer: Medicare HMO | Attending: Internal Medicine

## 2018-10-06 ENCOUNTER — Other Ambulatory Visit: Payer: Self-pay

## 2018-10-06 DIAGNOSIS — S81801A Unspecified open wound, right lower leg, initial encounter: Secondary | ICD-10-CM

## 2018-10-06 DIAGNOSIS — S81811A Laceration without foreign body, right lower leg, initial encounter: Secondary | ICD-10-CM

## 2018-10-06 DIAGNOSIS — R600 Localized edema: Secondary | ICD-10-CM | POA: Diagnosis not present

## 2018-10-06 NOTE — Therapy (Signed)
[place], [place], [address] Phone: [phone]   Fax:  [phone]  Wound Care Therapy/Progress Note  Patient Details  Name: William Sandoval MRN: [id number] Date of Birth: 26-Jul-1946 Referring Provider (PT): Celene Squibb, MD   Encounter Date: 10/06/2018  Progress Note Reporting Period 09/02/18 to 10/06/18  See note below for Objective Data and Assessment of Progress/Goals.    PT End of Session - 10/06/18 1206    Visit Number  9    Number of Visits  13    Date for PT Re-Evaluation  10/14/18    Authorization Type  [employer] (no auth required, visits on medical necessity)    Authorization Time Period  09/02/18 - 10/16/18    Authorization - Visit Number  1    Authorization - Number of Visits  10    PT Start Time  0815    PT Stop Time  0900    PT Time Calculation (min)  45 min    Activity Tolerance  Patient tolerated treatment well    Behavior During Therapy  [facility] for tasks assessed/performed       Past Medical History:  Diagnosis Date  . Chronic atrial fibrillation   . Dyslipidemia   . Epistaxis   . Hypertension     Past Surgical History:  Procedure Laterality Date  . SHOULDER SURGERY     Right    There were no vitals filed for this visit.     [facility] PT Assessment - 10/06/18 0001      Assessment   Medical Diagnosis  Rt Lower Leg Wound    Referring Provider (PT)  Celene Squibb, MD    Prior Therapy  stitches by Dr. [place] Crane      Restrictions   Weight Bearing Restrictions  No      Home Environment   Additional Comments  patient lives alone      Prior Function   Level of Independence  Independent      Cognition   Overall Cognitive Status  Within Functional Limits for tasks assessed       Wound Therapy - 10/06/18 1157    Subjective  Patient arrives with dressing intact. He reports it has not been itching as badly.     Patient and Family Stated Goals  wound to heal    Date of Onset   08/18/18   approximate   Prior Treatments  stitches by Dr. [place] Crane    Pain Scale  0-10    Pain Score  0-No pain    Evaluation and Treatment Procedures Explained to Patient/Family  Yes    Evaluation and Treatment Procedures  agreed to    Wound Properties Date First Assessed: 09/02/18 Time First Assessed: 0910 Wound Type: Laceration Location: Leg Location Orientation: Right;Lower;Posterior Wound Description (Comments): laceration of Rt calf Present on Admission: Yes   Dressing Type  Compression wrap;Alginate   alginate, profore wrap, petrolleum applied to periwound   Dressing Changed  Changed    Dressing Status  Old drainage    Dressing Change Frequency  PRN    Site / Wound Assessment  Red;Yellow    % Wound base Red or Granulating  100%    % Wound base Yellow/Fibrinous Exudate  0%    % Wound base Black/Eschar  0%    % Wound base Other/Granulation Tissue (Comment)  0%    Peri-wound Assessment  Edema;Maceration   diffuse maceration, spots of granulation -9.3cm  x 11cm x 0cm   Wound Length (cm)  0.2 cm   0.2   Wound Width (cm)  0.3 cm   0.6   Wound Depth (cm)  0.1 cm   0.1   Wound Volume (cm^3)  0.01 cm^3    Wound Surface Area (cm^2)  0.06 cm^2    Margins  Attached edges (approximated)    Closure  None    Drainage Amount  Moderate    Drainage Description  Serous    Treatment  Cleansed;Debridement (Selective)    Selective Debridement - Location  slough from incision, dry tissue perimeter of incision., slough from periwound drainage    Selective Debridement - Tools Used  Forceps    Selective Debridement - Tissue Removed  slough, devitalized tissue    Wound Therapy - Clinical Statement  Patient's original incision is much improved and almost fully approximated. Periwound presents with maceration again from drainage of ~ 9.3cm x 11cm area. This area was cleansed and slough debrided. Area continues to present as irritated and red, patient may benefit from antibiotic as this could be a  cellulitis. Petroleum applied to intact skin surround preiwound and alginate applied to address drainage. Continued with profore wrap and encouraged patient to speak to Dr. [place] Crane regarding obtaining antibiotics.    Wound Therapy - Functional Problem List  difficulty walking, pain    Factors Delaying/Impairing Wound Healing  Infection - systemic/local    Hydrotherapy Plan  Debridement;Dressing change;Patient/family education;Pulsatile lavage with suction    Wound Therapy - Frequency  2X / week    Wound Therapy - Current Recommendations  PT    Wound Plan  continue per current POC, keeping perimeter dry and wound moist.     Dressing   alginate, profore wrap, petrolleum applied to intact periwound        PT Education - 10/06/18 1206    Education Details  Educated on contacting doctor for antibiotics.    Person(s) Educated  Patient    Methods  Explanation       PT Short Term Goals - 09/24/18 0902      PT SHORT TERM GOAL #1   Title  Patient will verbalize signs/symptoms of local and systemic infection to improve safety and awareness of risk of infection with open wound and when to seek medical attention.    Time  2    Period  Weeks    Status  On-going        PT Long Term Goals - 09/24/18 0902      PT LONG TERM GOAL #1   Title  Wound will decresaed by 75% in size to demonstrate wound healing/approximation.    Time  6    Period  Weeks    Status  On-going      PT LONG TERM GOAL #2   Title  Wound will have 100% granulation tissue to improve healthy environment of wound bed and promote healing/wound closure.    Time  6    Period  Weeks    Status  On-going        Plan - 10/06/18 1207    Clinical Impression Statement  see above    Rehab Potential  Good    PT Frequency  2x / week    PT Duration  6 weeks    PT Treatment/Interventions  ADLs/Self Care Home Management;Taping;Manual techniques;Other (comment)   wound care, cleansing and debridement   PT Next Visit Plan  see above     Consulted and  Agree with Plan of Care  Patient       Patient will benefit from skilled therapeutic intervention in order to improve the following deficits and impairments:  Decreased skin integrity, Other (comment), Increased edema(risk of infection)  Visit Diagnosis: Laceration of skin of right lower leg, initial encounter  Open wound of right lower leg, initial encounter  Localized edema     Problem List Patient Active Problem List   Diagnosis Date Noted  . Long term (current) use of anticoagulants 03/14/2011  . HYPOTHYROIDISM 12/26/2009  . HYPERLIPIDEMIA 12/26/2009  . ATRIAL FIBRILLATION 12/26/2009  . SHOULDER PAIN, LEFT 12/26/2009    Kipp Brood, PT, DPT Physical Therapist with [facility]  10/06/2018 12:08 PM    [place], [place], [address] Phone: [phone]   Fax:  [phone]  Name: William Sandoval MRN: [id number] Date of Birth: 1946-08-22

## 2018-10-08 ENCOUNTER — Other Ambulatory Visit: Payer: Self-pay

## 2018-10-08 ENCOUNTER — Encounter (HOSPITAL_COMMUNITY): Payer: Self-pay

## 2018-10-08 ENCOUNTER — Ambulatory Visit (HOSPITAL_COMMUNITY): Payer: Medicare HMO

## 2018-10-08 DIAGNOSIS — S81801A Unspecified open wound, right lower leg, initial encounter: Secondary | ICD-10-CM | POA: Diagnosis not present

## 2018-10-08 DIAGNOSIS — R600 Localized edema: Secondary | ICD-10-CM

## 2018-10-08 DIAGNOSIS — S81811A Laceration without foreign body, right lower leg, initial encounter: Secondary | ICD-10-CM

## 2018-10-08 NOTE — Therapy (Signed)
[place], [place], [address] Phone: [phone]   Fax:  [phone]  Wound Care Therapy  Patient Details  Name: William Sandoval MRN: [id number] Date of Birth: 1946/06/26 Referring Provider (PT): Celene Squibb, MD   Encounter Date: 10/08/2018  PT End of Session - 10/08/18 1308    Visit Number  10    Number of Visits  13    Date for PT Re-Evaluation  10/14/18    Authorization Type  [employer] (no auth required, visits on medical necessity)    Authorization Time Period  09/02/18 - 10/16/18    Authorization - Visit Number  2    Authorization - Number of Visits  10    PT Start Time  0820    PT Stop Time  0903    PT Time Calculation (min)  43 min    Activity Tolerance  Patient tolerated treatment well    Behavior During Therapy  [facility] for tasks assessed/performed       Past Medical History:  Diagnosis Date  . Chronic atrial fibrillation   . Dyslipidemia   . Epistaxis   . Hypertension     Past Surgical History:  Procedure Laterality Date  . SHOULDER SURGERY     Right    There were no vitals filed for this visit.   Wound Therapy - 10/08/18 1300    Subjective  Patient reports his leg is not bothering him but states he did twist his back when swattign abug yesterday and now the right sid eof his back is hurting.     Patient and Family Stated Goals  wound to heal    Date of Onset  08/18/18   approximate   Prior Treatments  stitches by Dr. [place] Crane    Pain Scale  0-10    Pain Score  4     Pain Type  Acute pain    Pain Location  Back    Pain Orientation  Right    Pain Descriptors / Indicators  Aching    Pain Onset  Other (Comment)    Patients Stated Pain Goal  0    Pain Intervention(s)  Therapeutic touch;Emotional support    Evaluation and Treatment Procedures Explained to Patient/Family  Yes    Evaluation and Treatment Procedures  agreed to    Wound Properties Date First Assessed: 09/02/18 Time First  Assessed: 0910 Wound Type: Laceration Location: Leg Location Orientation: Right;Lower;Posterior Wound Description (Comments): laceration of Rt calf Present on Admission: Yes   Dressing Type  Compression wrap;Alginate   alginate, profore wrap, petrolleum applied to periwound   Dressing Changed  Changed    Dressing Status  Old drainage    Dressing Change Frequency  PRN    Site / Wound Assessment  Red;Yellow    % Wound base Red or Granulating  100%    % Wound base Yellow/Fibrinous Exudate  0%    % Wound base Black/Eschar  0%    % Wound base Other/Granulation Tissue (Comment)  0%    Peri-wound Assessment  Edema;Maceration   diffuse maceration, spots of granulation -9.3cm x 11cm x 0cm   Margins  Attached edges (approximated)    Closure  None    Drainage Amount  Moderate    Drainage Description  Serous    Treatment  Cleansed;Debridement (Selective)    Selective Debridement - Location  slough from incision, dry tissue perimeter of incision, slough from periwound drainage  Selective Debridement - Tools Used  Forceps    Selective Debridement - Tissue Removed  slough, devitalized tissue    Wound Therapy - Clinical Statement  Periwound is less macerated today but remains erythematous and continues to weep serosanguinous fluid. Continued to cleanse and debride slough and applied petroleum to intact skin surround preiwound and alginate applied to address drainage. Original wound is closed but has notable scar tissue build up or edema superior to new scar. Patient may benefit from antibiotic as this could be a cellulitis. Therapist will call MD office to request antibiotics for patient. Continued with profore wrap.    Wound Therapy - Functional Problem List  difficulty walking, pain    Factors Delaying/Impairing Wound Healing  Infection - systemic/local    Hydrotherapy Plan  Debridement;Dressing change;Patient/family education;Pulsatile lavage with suction    Wound Therapy - Frequency  2X / week     Wound Therapy - Current Recommendations  PT    Wound Plan  continue per current POC, keeping perimeter dry and wound moist.     Dressing   alginate, profore wrap, petrolleum applied to intact periwound        PT Short Term Goals - 09/24/18 0902      PT SHORT TERM GOAL #1   Title  Patient will verbalize signs/symptoms of local and systemic infection to improve safety and awareness of risk of infection with open wound and when to seek medical attention.    Time  2    Period  Weeks    Status  On-going        PT Long Term Goals - 09/24/18 0902      PT LONG TERM GOAL #1   Title  Wound will decresaed by 75% in size to demonstrate wound healing/approximation.    Time  6    Period  Weeks    Status  On-going      PT LONG TERM GOAL #2   Title  Wound will have 100% granulation tissue to improve healthy environment of wound bed and promote healing/wound closure.    Time  6    Period  Weeks    Status  On-going        Plan - 10/08/18 1309    Clinical Impression Statement  see above    Rehab Potential  Good    PT Frequency  2x / week    PT Duration  6 weeks    PT Treatment/Interventions  ADLs/Self Care Home Management;Taping;Manual techniques;Other (comment)   wound care, cleansing and debridement   PT Next Visit Plan  see above    Consulted and Agree with Plan of Care  Patient       Patient will benefit from skilled therapeutic intervention in order to improve the following deficits and impairments:  Decreased skin integrity, Other (comment), Increased edema(risk of infection)  Visit Diagnosis: Laceration of skin of right lower leg, initial encounter  Open wound of right lower leg, initial encounter  Localized edema     Problem List Patient Active Problem List   Diagnosis Date Noted  . Long term (current) use of anticoagulants 03/14/2011  . HYPOTHYROIDISM 12/26/2009  . HYPERLIPIDEMIA 12/26/2009  . ATRIAL FIBRILLATION 12/26/2009  . SHOULDER PAIN, LEFT 12/26/2009     Kipp Brood, PT, DPT Physical Therapist with [facility]  10/08/2018 1:09 PM    [place] [address] [place], [place], [address] Phone: [phone]   Fax:  [phone]  Name: William Sandoval MRN: [id number] Date of Birth: May 27, 1946

## 2018-10-13 ENCOUNTER — Other Ambulatory Visit: Payer: Self-pay

## 2018-10-13 ENCOUNTER — Ambulatory Visit (HOSPITAL_COMMUNITY): Payer: Medicare HMO

## 2018-10-13 ENCOUNTER — Encounter (HOSPITAL_COMMUNITY): Payer: Self-pay

## 2018-10-13 DIAGNOSIS — S81811A Laceration without foreign body, right lower leg, initial encounter: Secondary | ICD-10-CM

## 2018-10-13 DIAGNOSIS — R600 Localized edema: Secondary | ICD-10-CM | POA: Diagnosis not present

## 2018-10-13 DIAGNOSIS — S81801A Unspecified open wound, right lower leg, initial encounter: Secondary | ICD-10-CM | POA: Diagnosis not present

## 2018-10-13 NOTE — Therapy (Signed)
[address] [place], [place], [address] Phone: [phone]   Fax:  [phone]  Wound Care Therapy  Patient Details  Name: William Sandoval MRN: [id number] Date of Birth: [birth year] Referring Provider (PT): Celene Squibb, MD   Encounter Date: 10/13/2018  PT End of Session - 10/13/18 1131    Visit Number  11    Number of Visits  13    Date for PT Re-Evaluation  10/30/18    Authorization Type  [employer] (no auth required, visits on medical necessity)    Authorization Time Period  09/02/18 - 10/16/18; 10/16/18-10/30/18    Authorization - Visit Number  3    Authorization - Number of Visits  10    PT Start Time  0818    PT Stop Time  0908    PT Time Calculation (min)  50 min    Activity Tolerance  Patient tolerated treatment well    Behavior During Therapy  [facility] for tasks assessed/performed       Past Medical History:  Diagnosis Date  . Chronic atrial fibrillation   . Dyslipidemia   . Epistaxis   . Hypertension     Past Surgical History:  Procedure Laterality Date  . SHOULDER SURGERY     Right    There were no vitals filed for this visit.    Wound Therapy - 10/13/18 1043    Subjective  Patient reports his skin has been itching more and more under the wrap. He denies pain however. His wrap has moved down his leg some.    Patient and Family Stated Goals  wound to heal    Date of Onset  08/18/18   approximate   Prior Treatments  stitches by Dr. [place] Crane    Pain Scale  0-10    Pain Score  0-No pain    Evaluation and Treatment Procedures Explained to Patient/Family  Yes    Evaluation and Treatment Procedures  agreed to    Wound Properties Date First Assessed: 09/02/18 Time First Assessed: 0910 Wound Type: Laceration Location: Leg Location Orientation: Right;Lower;Posterior Wound Description (Comments): laceration of Rt calf Present on Admission: Yes   Dressing Type  Compression wrap;Alginate   alginate, profore  wrap, petrolleum applied to periwound   Dressing Changed  Changed    Dressing Status  Old drainage    Dressing Change Frequency  PRN    Site / Wound Assessment  Red;Yellow    % Wound base Red or Granulating  100%    % Wound base Yellow/Fibrinous Exudate  0%    % Wound base Black/Eschar  0%    % Wound base Other/Granulation Tissue (Comment)  0%    Peri-wound Assessment  Edema   diffuse maceration, spots of granulation -9.3cm x 11cm x 0cm   Margins  Attached edges (approximated)    Closure  None    Drainage Amount  Moderate    Drainage Description  Serous    Selective Debridement - Location  slough from incision, dry tissue perimeter of incision, slough from periwound drainage    Selective Debridement - Tools Used  Forceps    Selective Debridement - Tissue Removed  slough, devitalized tissue    Wound Therapy - Clinical Statement  Periwound is much improved with less maceration and reduced erythema. Patient reported he began antibiotics on Saturday. Open spots in periwound continue to drain serosanguinous drainage. Continued to cleanse and debride slough, applied alginate to weeping areas of  periwound. Patient has 2 wounds that appear to be open and draining on posterior leg above original laceration. Dressed with xeroform to promote healing of those two wounds. Patch on anterior leg that patient has had for year is improved with no maceration and no erythema. Continued with profore wrapping. Patient will benefit form additional 2 weeks of skilled wound care to manage edema and weeping periwound to promote healing and keep skin intact.    Wound Therapy - Functional Problem List  difficulty walking, pain    Factors Delaying/Impairing Wound Healing  Infection - systemic/local    Hydrotherapy Plan  Debridement;Dressing change;Patient/family education;Pulsatile lavage with suction    Wound Therapy - Frequency  2X / week    Wound Therapy - Current Recommendations  PT    Wound Plan  continue per  current POC, keeping perimeter dry and wound moist.     Dressing   alginate, profore wrap, petrolleum applied to intact periwound        PT Short Term Goals - 09/24/18 0902      PT SHORT TERM GOAL #1   Title  Patient will verbalize signs/symptoms of local and systemic infection to improve safety and awareness of risk of infection with open wound and when to seek medical attention.    Time  2    Period  Weeks    Status  On-going        PT Long Term Goals - 09/24/18 0902      PT LONG TERM GOAL #1   Title  Wound will decresaed by 75% in size to demonstrate wound healing/approximation.    Time  6    Period  Weeks    Status  On-going      PT LONG TERM GOAL #2   Title  Wound will have 100% granulation tissue to improve healthy environment of wound bed and promote healing/wound closure.    Time  6    Period  Weeks    Status  On-going        Plan - 10/13/18 1129    Clinical Impression Statement  see above    Rehab Potential  Good    PT Frequency  2x / week    PT Duration  2 weeks   2 additional weeks   PT Treatment/Interventions  ADLs/Self Care Home Management;Taping;Manual techniques;Other (comment)   wound care, cleansing and debridement   PT Next Visit Plan  see above    Consulted and Agree with Plan of Care  Patient       Patient will benefit from skilled therapeutic intervention in order to improve the following deficits and impairments:  Decreased skin integrity, Other (comment), Increased edema(risk of infection)  Visit Diagnosis: Laceration of skin of right lower leg, initial encounter  Open wound of right lower leg, initial encounter  Localized edema     Problem List Patient Active Problem List   Diagnosis Date Noted  . Long term (current) use of anticoagulants 03/14/2011  . HYPOTHYROIDISM 12/26/2009  . HYPERLIPIDEMIA 12/26/2009  . ATRIAL FIBRILLATION 12/26/2009  . SHOULDER PAIN, LEFT 12/26/2009    Kipp Brood, PT, DPT Physical Therapist  with [facility]  10/13/2018 11:34 AM    [place], [place], [address] Phone: [phone]   Fax:  [phone]  Name: William Sandoval MRN: [id number] Date of Birth: May 09, 1946

## 2018-10-15 ENCOUNTER — Encounter (HOSPITAL_COMMUNITY): Payer: Self-pay

## 2018-10-15 ENCOUNTER — Ambulatory Visit (HOSPITAL_COMMUNITY): Payer: Medicare HMO

## 2018-10-15 DIAGNOSIS — R600 Localized edema: Secondary | ICD-10-CM

## 2018-10-15 DIAGNOSIS — S81801A Unspecified open wound, right lower leg, initial encounter: Secondary | ICD-10-CM

## 2018-10-15 DIAGNOSIS — S81811A Laceration without foreign body, right lower leg, initial encounter: Secondary | ICD-10-CM

## 2018-10-15 NOTE — Therapy (Signed)
[place], [place], [address] Phone: [phone]   Fax:  [phone]  Wound Care Therapy  Patient Details  Name: William Sandoval MRN: [id number] Date of Birth: [birth year] Referring Provider (PT): Celene Squibb, MD   Encounter Date: 10/15/2018  PT End of Session - 10/15/18 1640    Visit Number  12    Number of Visits  13    Date for PT Re-Evaluation  10/30/18    Authorization Type  [employer] (no auth required, visits on medical necessity)    Authorization Time Period  09/02/18 - 10/16/18; 10/16/18-10/30/18    Authorization - Visit Number  4    Authorization - Number of Visits  10    PT Start Time  0816    PT Stop Time  0856    PT Time Calculation (min)  40 min    Activity Tolerance  Patient tolerated treatment well    Behavior During Therapy  [facility] for tasks assessed/performed       Past Medical History:  Diagnosis Date  . Chronic atrial fibrillation   . Dyslipidemia   . Epistaxis   . Hypertension     Past Surgical History:  Procedure Laterality Date  . SHOULDER SURGERY     Right    There were no vitals filed for this visit.   Subjective Assessment - 10/15/18 0900    Subjective  Pt arrived with dressing intact, no reports of pain does c/o some itch    Currently in Pain?  No/denies                Wound Therapy - 10/15/18 1645    Subjective  Pt arrived with dressing intact, no reports of pain does c/o some itch    Patient and Family Stated Goals  wound to heal    Date of Onset  08/18/18   approximate   Prior Treatments  stitches by Dr. [place] Crane    Evaluation and Treatment Procedures Explained to Patient/Family  Yes    Evaluation and Treatment Procedures  agreed to    Wound Properties Date First Assessed: 09/02/18 Time First Assessed: 0910 Wound Type: Laceration Location: Leg Location Orientation: Right;Lower;Posterior Wound Description (Comments): laceration of Rt calf Present on  Admission: Yes   Dressing Type  Alginate;Compression wrap;Gauze (Comment)   alginate, gauze and profore with netting   Dressing Status  Old drainage    Dressing Change Frequency  PRN    Site / Wound Assessment  Red;Yellow    % Wound base Red or Granulating  100%    % Wound base Yellow/Fibrinous Exudate  0%    % Wound base Black/Eschar  0%    Peri-wound Assessment  Edema   diffuse maceration   Margins  Attached edges (approximated)    Closure  None    Drainage Amount  Moderate    Drainage Description  Serous    Wound Properties Date First Assessed: 10/15/18 Time First Assessed: 0850 Wound Type: Non-pressure wound Location: Leg Location Orientation: Right;Posterior;Lateral Wound Description (Comments): raised opening   Dressing Type  Impregnated gauze (bismuth);Gauze (Comment);Compression wrap   xeroform, gauze and compression wrap   Dressing Status  Clean;Dry;Intact    Dressing Change Frequency  PRN    Site / Wound Assessment  Yellow;Red    % Wound base Red or Granulating  75%    % Wound base Yellow/Fibrinous Exudate  25%    Peri-wound Assessment  Intact;Edema  Wound Length (cm)  1 cm    Wound Width (cm)  1.2 cm    Wound Surface Area (cm^2)  1.2 cm^2    Drainage Amount  Scant    Drainage Description  Serosanguineous    Wound Properties Date First Assessed: 10/15/18 Wound Type: Non-pressure wound Location: Leg Location Orientation: Posterior;Medial   Dressing Type  Impregnated gauze (bismuth);Gauze (Comment);Compression wrap   xeroform, gauze and compression wrap   Dressing Status  Clean;Dry;Intact    Dressing Change Frequency  PRN    Site / Wound Assessment  Clean;Granulation tissue    % Wound base Red or Granulating  85%    % Wound base Yellow/Fibrinous Exudate  15%    % Wound base Black/Eschar  0%    Peri-wound Assessment  Intact    Wound Length (cm)  0.8 cm    Wound Width (cm)  0.7 cm    Wound Depth (cm)  0 cm    Wound Volume (cm^3)  0 cm^3    Wound Surface Area (cm^2)   0.56 cm^2    Drainage Amount  Scant    Drainage Description  Serous    Wound Properties Date First Assessed: 10/15/18 Time First Assessed: 8341 Wound Type: Non-pressure wound Location: Leg Location Orientation: Right;Anterior   Dressing Type  Alginate;Gauze (Comment);Compression wrap    Dressing Changed  Changed    Dressing Status  Clean;Dry;Intact    Dressing Change Frequency  PRN    Site / Wound Assessment  Red;Yellow    % Wound base Red or Granulating  95%    % Wound base Yellow/Fibrinous Exudate  5%    Peri-wound Assessment  Intact    Wound Length (cm)  3 cm    Wound Width (cm)  3.2 cm    Wound Depth (cm)  0 cm    Wound Volume (cm^3)  0 cm^3    Wound Surface Area (cm^2)  9.6 cm^2    Drainage Amount  Scant    Drainage Description  Serous    Treatment  Cleansed;Debridement (Selective)    Selective Debridement - Location  slough from incision, dry tissue perimeter of incision, slough from periwound drainage    Selective Debridement - Tools Used  Forceps    Selective Debridement - Tissue Removed  slough, devitalized tissue    Wound Therapy - Clinical Statement  Improved skin integrity with minimal maceration present this session.  Continues to have moderate serosanguinous drainage on lateral wound.  Measurements taken of the two posterior small openings: Posterior lateral (L 1cmx W1.2, raised .2cm from skin), posterion medial (L .8cmx W .7cm).  Continued wiht alginate to address drainage on lateral and anterior wound, xeroform on posterior wounds and compression wrap to address edema.  No reports of pain through session.      Wound Therapy - Functional Problem List  difficulty walking, pain    Factors Delaying/Impairing Wound Healing  Infection - systemic/local    Hydrotherapy Plan  Debridement;Dressing change;Patient/family education;Pulsatile lavage with suction    Wound Therapy - Frequency  2X / week    Wound Therapy - Current Recommendations  PT    Wound Plan  continue per current  POC, keeping perimeter dry and wound moist.     Dressing   alginate, profore wrap, petrolleum applied to intact periwound    Dressing  xeroform, gauze, proform                PT Short Term Goals - 09/24/18 9622  PT SHORT TERM GOAL #1   Title  Patient will verbalize signs/symptoms of local and systemic infection to improve safety and awareness of risk of infection with open wound and when to seek medical attention.    Time  2    Period  Weeks    Status  On-going        PT Long Term Goals - 09/24/18 0902      PT LONG TERM GOAL #1   Title  Wound will decresaed by 75% in size to demonstrate wound healing/approximation.    Time  6    Period  Weeks    Status  On-going      PT LONG TERM GOAL #2   Title  Wound will have 100% granulation tissue to improve healthy environment of wound bed and promote healing/wound closure.    Time  6    Period  Weeks    Status  On-going              Patient will benefit from skilled therapeutic intervention in order to improve the following deficits and impairments:     Visit Diagnosis: Laceration of skin of right lower leg, initial encounter  Open wound of right lower leg, initial encounter  Localized edema     Problem List Patient Active Problem List   Diagnosis Date Noted  . Long term (current) use of anticoagulants 03/14/2011  . HYPOTHYROIDISM 12/26/2009  . HYPERLIPIDEMIA 12/26/2009  . ATRIAL FIBRILLATION 12/26/2009  . SHOULDER PAIN, LEFT 12/26/2009   Ihor Austin, [place]; [place]  Aldona Lento 10/15/2018, 4:51 PM  [place], [place], [address] Phone: [phone]   Fax:  [phone]  Name: William Sandoval MRN: [id number] Date of Birth: [birth year]

## 2018-10-19 ENCOUNTER — Ambulatory Visit (HOSPITAL_COMMUNITY): Payer: Medicare HMO

## 2018-10-19 ENCOUNTER — Encounter (HOSPITAL_COMMUNITY): Payer: Self-pay

## 2018-10-19 ENCOUNTER — Other Ambulatory Visit: Payer: Self-pay

## 2018-10-19 DIAGNOSIS — Z4802 Encounter for removal of sutures: Secondary | ICD-10-CM | POA: Diagnosis not present

## 2018-10-19 DIAGNOSIS — S81801A Unspecified open wound, right lower leg, initial encounter: Secondary | ICD-10-CM | POA: Diagnosis not present

## 2018-10-19 DIAGNOSIS — S81811A Laceration without foreign body, right lower leg, initial encounter: Secondary | ICD-10-CM

## 2018-10-19 DIAGNOSIS — I10 Essential (primary) hypertension: Secondary | ICD-10-CM | POA: Diagnosis not present

## 2018-10-19 DIAGNOSIS — R600 Localized edema: Secondary | ICD-10-CM | POA: Diagnosis not present

## 2018-10-19 DIAGNOSIS — E039 Hypothyroidism, unspecified: Secondary | ICD-10-CM | POA: Diagnosis not present

## 2018-10-19 DIAGNOSIS — S81811D Laceration without foreign body, right lower leg, subsequent encounter: Secondary | ICD-10-CM | POA: Diagnosis not present

## 2018-10-19 DIAGNOSIS — Z7901 Long term (current) use of anticoagulants: Secondary | ICD-10-CM | POA: Diagnosis not present

## 2018-10-19 DIAGNOSIS — I4891 Unspecified atrial fibrillation: Secondary | ICD-10-CM | POA: Diagnosis not present

## 2018-10-19 DIAGNOSIS — I482 Chronic atrial fibrillation, unspecified: Secondary | ICD-10-CM | POA: Diagnosis not present

## 2018-10-19 DIAGNOSIS — Z0001 Encounter for general adult medical examination with abnormal findings: Secondary | ICD-10-CM | POA: Diagnosis not present

## 2018-10-19 DIAGNOSIS — I480 Paroxysmal atrial fibrillation: Secondary | ICD-10-CM | POA: Diagnosis not present

## 2018-10-19 NOTE — Therapy (Signed)
[place], [place], [address] Phone: [phone]   Fax:  [phone]  Wound Care Therapy  Patient Details  Name: William Sandoval MRN: [id number] Date of Birth: 22-Sep-1946 Referring Provider (PT): Celene Squibb, MD   Encounter Date: 10/19/2018  PT End of Session - 10/19/18 1111    Visit Number  13    Number of Visits  17    Date for PT Re-Evaluation  10/30/18    Authorization Type  [employer] (no auth required, visits on medical necessity)    Authorization Time Period  09/02/18 - 10/16/18; 10/16/18-10/30/18    Authorization - Visit Number  5    Authorization - Number of Visits  10    PT Start Time  0908    PT Stop Time  0950    PT Time Calculation (min)  42 min    Activity Tolerance  Patient tolerated treatment well    Behavior During Therapy  [facility] for tasks assessed/performed       Past Medical History:  Diagnosis Date  . Chronic atrial fibrillation   . Dyslipidemia   . Epistaxis   . Hypertension     Past Surgical History:  Procedure Laterality Date  . SHOULDER SURGERY     Right    There were no vitals filed for this visit.     Wound Therapy - 10/19/18 1102    Subjective  Patient arrived with dressing intact. His wrap is rolled up teh foot some but he states he cannot wear a sock over it as that is too painful. He denies pain currently.    Patient and Family Stated Goals  wound to heal    Date of Onset  08/18/18   approximate   Prior Treatments  stitches by Dr. [place] Crane    Pain Scale  0-10    Pain Score  0-No pain    Evaluation and Treatment Procedures Explained to Patient/Family  Yes    Evaluation and Treatment Procedures  agreed to    Wound Properties Date First Assessed: 09/02/18 Time First Assessed: 0910 Wound Type: Laceration Location: Leg Location Orientation: Right;Lower;Posterior Wound Description (Comments): laceration of Rt calf Present on Admission: Yes   Dressing Type   Compression wrap;Gauze (Comment)   alginate, gauze and profore with netting   Dressing Changed  Changed    Dressing Status  Old drainage    Dressing Change Frequency  PRN    Site / Wound Assessment  Red;Yellow    % Wound base Red or Granulating  100%    % Wound base Yellow/Fibrinous Exudate  0%    % Wound base Black/Eschar  0%    Peri-wound Assessment  Edema   diffuse maceration   Margins  Attached edges (approximated)    Closure  None    Drainage Amount  Moderate    Drainage Description  Serous    Treatment  Cleansed;Debridement (Selective)    Wound Properties Date First Assessed: 10/15/18 Time First Assessed: 0850 Wound Type: Non-pressure wound Location: Leg Location Orientation: Right;Posterior;Lateral Wound Description (Comments): raised opening   Dressing Type  Impregnated gauze (bismuth);Gauze (Comment);Compression wrap   xeroform, gauze and compression wrap   Dressing Changed  Changed    Dressing Status  Clean;Dry;Intact    Dressing Change Frequency  PRN    Site / Wound Assessment  Yellow;Red    % Wound base Red or Granulating  75%    % Wound base Yellow/Fibrinous Exudate  25%    Peri-wound Assessment  Intact;Edema    Drainage Amount  Scant    Drainage Description  Serous    Treatment  Cleansed;Debridement (Selective)    Wound Properties Date First Assessed: 10/15/18 Wound Type: Non-pressure wound Location: Leg Location Orientation: Posterior;Medial   Dressing Type  Impregnated gauze (bismuth);Gauze (Comment);Compression wrap   xeroform, gauze and compression wrap   Dressing Changed  Changed    Dressing Status  Clean;Dry;Intact    Dressing Change Frequency  PRN    Site / Wound Assessment  Clean;Granulation tissue    % Wound base Red or Granulating  85%    % Wound base Yellow/Fibrinous Exudate  15%    % Wound base Black/Eschar  0%    Peri-wound Assessment  Intact    Drainage Amount  Scant    Drainage Description  Serous    Treatment  Cleansed;Debridement (Selective)     Wound Properties Date First Assessed: 10/15/18 Time First Assessed: 0850 Wound Type: Non-pressure wound Location: Leg Location Orientation: Right;Anterior   Dressing Type  Gauze (Comment);Compression wrap    Dressing Changed  Changed    Dressing Status  Clean;Dry;Intact    Dressing Change Frequency  PRN    Site / Wound Assessment  Red;Yellow    % Wound base Red or Granulating  95%    % Wound base Yellow/Fibrinous Exudate  5%    Peri-wound Assessment  Intact    Drainage Amount  Scant    Drainage Description  Serous    Treatment  Cleansed;Debridement (Selective)    Selective Debridement - Location  slough, dry tissue perimeter of incision, slough from periwound drainage    Selective Debridement - Tools Used  Forceps    Selective Debridement - Tissue Removed  slough, devitalized tissue    Wound Therapy - Clinical Statement  Patients skin integrity continues to appear improved with decreased maceration. Two posterior wounds measured last session are approximating well and xeroform was continued. Anterior wound has improved integrity with decreased opening and no alginate was applied this date. Posterolateral breakdown around original laceration continues to has serosanginous drainage but is improving with reduced openings throughout. Alginate was not applied to weeping area today as it was dry upon dressing removal. Patient will continue to benefit from skilled wound care to improve skin integrity and promote healing. If wounds do not improve further in the next week, patient may benefit from dermatology referral to determine if there is a fungal infection limiting healing.     Wound Therapy - Functional Problem List  difficulty walking, pain    Factors Delaying/Impairing Wound Healing  Infection - systemic/local    Hydrotherapy Plan  Debridement;Dressing change;Patient/family education;Pulsatile lavage with suction    Wound Therapy - Frequency  2X / week    Wound Therapy - Current Recommendations   PT    Wound Plan  continue per current POC, keeping perimeter dry and wound moist.     Dressing   profore wrap, petrolleum applied to intact periwound    Dressing  xeroform, gauze, proform       PT Short Term Goals - 09/24/18 0902      PT SHORT TERM GOAL #1   Title  Patient will verbalize signs/symptoms of local and systemic infection to improve safety and awareness of risk of infection with open wound and when to seek medical attention.    Time  2    Period  Weeks    Status  On-going  PT Long Term Goals - 09/24/18 0902      PT LONG TERM GOAL #1   Title  Wound will decresaed by 75% in size to demonstrate wound healing/approximation.    Time  6    Period  Weeks    Status  On-going      PT LONG TERM GOAL #2   Title  Wound will have 100% granulation tissue to improve healthy environment of wound bed and promote healing/wound closure.    Time  6    Period  Weeks    Status  On-going        Plan - 10/19/18 1112    Clinical Impression Statement  see above    Rehab Potential  Good    PT Frequency  2x / week    PT Duration  2 weeks   2 additional weeks   PT Treatment/Interventions  ADLs/Self Care Home Management;Taping;Manual techniques;Other (comment)   wound care, cleansing and debridement   PT Next Visit Plan  see above    Consulted and Agree with Plan of Care  Patient       Patient will benefit from skilled therapeutic intervention in order to improve the following deficits and impairments:  Decreased skin integrity, Other (comment), Increased edema(risk of infection)  Visit Diagnosis: Laceration of skin of right lower leg, initial encounter  Open wound of right lower leg, initial encounter  Localized edema     Problem List Patient Active Problem List   Diagnosis Date Noted  . Long term (current) use of anticoagulants 03/14/2011  . HYPOTHYROIDISM 12/26/2009  . HYPERLIPIDEMIA 12/26/2009  . ATRIAL FIBRILLATION 12/26/2009  . SHOULDER PAIN, LEFT  12/26/2009    Kipp Brood, PT, DPT Physical Therapist with [facility]  10/19/2018 11:12 AM    [place], [place], [address] Phone: [phone]   Fax:  [phone]  Name: William Sandoval MRN: [id number] Date of Birth: 08/05/1946

## 2018-10-19 NOTE — Addendum Note (Signed)
Addended by: Kipp Brood E on: 10/19/2018 08:00 AM   Modules accepted: Orders

## 2018-10-22 ENCOUNTER — Ambulatory Visit (HOSPITAL_COMMUNITY): Payer: Medicare HMO

## 2018-10-22 ENCOUNTER — Other Ambulatory Visit: Payer: Self-pay

## 2018-10-22 ENCOUNTER — Encounter (HOSPITAL_COMMUNITY): Payer: Self-pay

## 2018-10-22 DIAGNOSIS — R600 Localized edema: Secondary | ICD-10-CM

## 2018-10-22 DIAGNOSIS — S81801A Unspecified open wound, right lower leg, initial encounter: Secondary | ICD-10-CM | POA: Diagnosis not present

## 2018-10-22 DIAGNOSIS — S81811A Laceration without foreign body, right lower leg, initial encounter: Secondary | ICD-10-CM | POA: Diagnosis not present

## 2018-10-22 NOTE — Therapy (Signed)
[place], [place], [address] Phone: [phone]   Fax:  [phone]  Wound Care Therapy  Patient Details  Name: William Sandoval MRN: [id number] Date of Birth: 09-26-1946 Referring Provider (PT): Celene Squibb, MD   Encounter Date: 10/22/2018  PT End of Session - 10/22/18 1219    Visit Number  14    Number of Visits  17    Date for PT Re-Evaluation  10/30/18    Authorization Type  [employer] (no auth required, visits on medical necessity)    Authorization Time Period  09/02/18 - 10/16/18; 10/16/18-10/30/18    Authorization - Visit Number  6    Authorization - Number of Visits  10    PT Start Time  0911    PT Stop Time  0949    PT Time Calculation (min)  38 min    Activity Tolerance  Patient tolerated treatment well    Behavior During Therapy  [facility] for tasks assessed/performed       Past Medical History:  Diagnosis Date  . Chronic atrial fibrillation   . Dyslipidemia   . Epistaxis   . Hypertension     Past Surgical History:  Procedure Laterality Date  . SHOULDER SURGERY     Right    There were no vitals filed for this visit.     Wound Therapy - 10/22/18 0956    Subjective  Patient arrives with dressing intact. He reports his great toe is swollenand there is clear fluid draining from out of his toe nail.     Patient and Family Stated Goals  wound to heal    Date of Onset  08/18/18   approximate   Prior Treatments  stitches by Dr. [place] Crane    Pain Scale  0-10    Pain Score  0-No pain    Evaluation and Treatment Procedures Explained to Patient/Family  Yes    Evaluation and Treatment Procedures  agreed to    Wound Properties Date First Assessed: 09/02/18 Time First Assessed: 0910 Wound Type: Laceration Location: Leg Location Orientation: Right;Lower;Posterior Wound Description (Comments): laceration of Rt calf Present on Admission: Yes   Dressing Type  Compression wrap;Gauze (Comment)    alginate, gauze and profore with netting   Dressing Changed  Changed    Dressing Status  Old drainage    Dressing Change Frequency  PRN    Site / Wound Assessment  Red;Yellow    % Wound base Red or Granulating  100%   small patches of dermis visible, resurfacing present   % Wound base Yellow/Fibrinous Exudate  0%    % Wound base Black/Eschar  0%    Peri-wound Assessment  Edema   periwound - 10cm x 9cm   Margins  Attached edges (approximated)    Closure  None    Drainage Amount  Moderate    Drainage Description  Serous    Treatment  Cleansed;Debridement (Selective)    Wound Properties Date First Assessed: 10/15/18 Time First Assessed: 0850 Wound Type: Non-pressure wound Location: Leg Location Orientation: Right;Posterior;Lateral Wound Description (Comments): raised opening Final Assessment Date: 10/22/18 Final Assessment Time: 0915   Dressing Type  Impregnated gauze (bismuth);Gauze (Comment);Compression wrap   xeroform, gauze and compression wrap   Dressing Changed  Other (Comment)  (Pended)    wound fully healed   Dressing Status  Clean;Dry;Intact    Dressing Change Frequency  PRN    Site / Wound Assessment  Yellow;Red    %  Wound base Red or Granulating  0%  (Pended)     % Wound base Yellow/Fibrinous Exudate  0%  (Pended)     Peri-wound Assessment  --  (Pended)     Wound Length (cm)  0 cm    Wound Width (cm)  0 cm    Wound Depth (cm)  0 cm    Wound Volume (cm^3)  0 cm^3    Wound Surface Area (cm^2)  0 cm^2    Closure  Approximated  (Pended)     Drainage Amount  None  (Pended)     Drainage Description  --  (Pended)     Treatment  Cleansed  (Pended)     Wound Properties Date First Assessed: 10/15/18 Wound Type: Non-pressure wound Location: Leg Location Orientation: Posterior;Medial Final Assessment Date: 10/22/18 Final Assessment Time: 0915   Dressing Type  Impregnated gauze (bismuth);Gauze (Comment);Compression wrap   xeroform, gauze and compression wrap   Dressing Changed   Other (Comment)   wound fully healed   Dressing Status  Clean;Dry;Intact    Dressing Change Frequency  PRN    Site / Wound Assessment  Clean;Granulation tissue    % Wound base Red or Granulating  85%    % Wound base Yellow/Fibrinous Exudate  0%  (Pended)     % Wound base Black/Eschar  0%    Peri-wound Assessment  Intact    Wound Length (cm)  0 cm    Wound Width (cm)  0 cm    Wound Depth (cm)  0 cm    Wound Volume (cm^3)  0 cm^3    Wound Surface Area (cm^2)  0 cm^2    Closure  Approximated    Drainage Amount  None  (Pended)     Drainage Description  --  (Pended)     Treatment  Cleansed    Wound Properties Date First Assessed: 10/15/18 Time First Assessed: 6314 Wound Type: Non-pressure wound Location: Leg Location Orientation: Right;Anterior   Dressing Type  Gauze (Comment);Compression wrap    Dressing Changed  Changed    Dressing Status  Clean;Dry;Intact    Dressing Change Frequency  PRN    Site / Wound Assessment  Red;Yellow    % Wound base Red or Granulating  95%   small patches of dermis visible, resurfacing present   % Wound base Yellow/Fibrinous Exudate  5%    Peri-wound Assessment  Intact    Wound Length (cm)  2.8 cm    Wound Width (cm)  3 cm    Wound Depth (cm)  0 cm    Wound Volume (cm^3)  0 cm^3    Wound Surface Area (cm^2)  8.4 cm^2    Drainage Amount  Scant    Drainage Description  Serous    Treatment  Cleansed;Debridement (Selective)    Selective Debridement - Location  slough, dry tissue perimeter of incision, slough from periwound drainage    Selective Debridement - Tools Used  Forceps    Selective Debridement - Tissue Removed  slough, devitalized tissue    Wound Therapy - Clinical Statement  Patient's skin continues to show improved integrity. Two small wounds on posterior leg assessed on 10/15/18 were both completed healed with full approximation and skin resurfacing. Anterior wound is much improved with minimal opening of dermis and no maceration. Posterolateral  skin breakdown around original laceration is much improved with good resurfacing evidence, no maceration, and fewer open patches to dermis. Patient does present with increased swelling in toes, especially great toe,  and reported serous drainage from toenail. This session added toe wraps to great toe, second, and third digit, and continued with petroleum around skin breakdown followed by profore wrapping to prevent edema. Patient will continue to benefit from skilled wound care to improve skin integrity and promote healing. If wounds do not improve further in the next week, patient may benefit from dermatology referral to determine if there is a fungal infection limiting healing.    Wound Therapy - Functional Problem List  difficulty walking, pain    Factors Delaying/Impairing Wound Healing  Infection - systemic/local    Hydrotherapy Plan  Debridement;Dressing change;Patient/family education;Pulsatile lavage with suction    Wound Therapy - Frequency  2X / week    Wound Therapy - Current Recommendations  PT    Wound Plan  continue per current POC, keeping perimeter dry and wound moist.     Dressing   profore wrap, petrolleum applied to intact periwound    Dressing  --       PT Short Term Goals - 09/24/18 0902      PT SHORT TERM GOAL #1   Title  Patient will verbalize signs/symptoms of local and systemic infection to improve safety and awareness of risk of infection with open wound and when to seek medical attention.    Time  2    Period  Weeks    Status  On-going        PT Long Term Goals - 09/24/18 0902      PT LONG TERM GOAL #1   Title  Wound will decresaed by 75% in size to demonstrate wound healing/approximation.    Time  6    Period  Weeks    Status  On-going      PT LONG TERM GOAL #2   Title  Wound will have 100% granulation tissue to improve healthy environment of wound bed and promote healing/wound closure.    Time  6    Period  Weeks    Status  On-going        Plan -  10/22/18 1219    Clinical Impression Statement  see above    Rehab Potential  Good    PT Frequency  2x / week    PT Duration  2 weeks   2 additional weeks   PT Treatment/Interventions  ADLs/Self Care Home Management;Taping;Manual techniques;Other (comment)   wound care, cleansing and debridement   PT Next Visit Plan  see above    Consulted and Agree with Plan of Care  Patient       Patient will benefit from skilled therapeutic intervention in order to improve the following deficits and impairments:  Decreased skin integrity, Other (comment), Increased edema(risk of infection)  Visit Diagnosis: Laceration of skin of right lower leg, initial encounter  Open wound of right lower leg, initial encounter  Localized edema     Problem List Patient Active Problem List   Diagnosis Date Noted  . Long term (current) use of anticoagulants 03/14/2011  . HYPOTHYROIDISM 12/26/2009  . HYPERLIPIDEMIA 12/26/2009  . ATRIAL FIBRILLATION 12/26/2009  . SHOULDER PAIN, LEFT 12/26/2009    Kipp Brood, PT, DPT Physical Therapist with [facility]  10/22/2018 12:20 PM    [place], [place], [address] Phone: [phone]   Fax:  [phone]  Name: William Sandoval MRN: [id number] Date of Birth: 1946-04-28

## 2018-10-26 DIAGNOSIS — I10 Essential (primary) hypertension: Secondary | ICD-10-CM | POA: Diagnosis not present

## 2018-10-26 DIAGNOSIS — E782 Mixed hyperlipidemia: Secondary | ICD-10-CM | POA: Diagnosis not present

## 2018-10-26 DIAGNOSIS — Z7901 Long term (current) use of anticoagulants: Secondary | ICD-10-CM | POA: Diagnosis not present

## 2018-10-26 DIAGNOSIS — I4891 Unspecified atrial fibrillation: Secondary | ICD-10-CM | POA: Diagnosis not present

## 2018-10-26 DIAGNOSIS — E039 Hypothyroidism, unspecified: Secondary | ICD-10-CM | POA: Diagnosis not present

## 2018-10-27 ENCOUNTER — Ambulatory Visit (HOSPITAL_COMMUNITY): Payer: Medicare HMO

## 2018-10-27 ENCOUNTER — Other Ambulatory Visit: Payer: Self-pay

## 2018-10-27 ENCOUNTER — Encounter (HOSPITAL_COMMUNITY): Payer: Self-pay

## 2018-10-27 DIAGNOSIS — S81801A Unspecified open wound, right lower leg, initial encounter: Secondary | ICD-10-CM

## 2018-10-27 DIAGNOSIS — S81811A Laceration without foreign body, right lower leg, initial encounter: Secondary | ICD-10-CM

## 2018-10-27 DIAGNOSIS — R600 Localized edema: Secondary | ICD-10-CM | POA: Diagnosis not present

## 2018-10-27 NOTE — Therapy (Signed)
[place], [place], [address] Phone: [phone]   Fax:  [phone]  Wound Care Therapy  Patient Details  Name: William Sandoval MRN: [id number] Date of Birth: 01-11-1946 Referring Provider (PT): Celene Squibb, MD   Encounter Date: 10/27/2018  PT End of Session - 10/27/18 1350    Visit Number  15    Number of Visits  17    Date for PT Re-Evaluation  10/30/18    Authorization Type  [employer] (no auth required, visits on medical necessity)    Authorization Time Period  09/02/18 - 10/16/18; 10/16/18-10/30/18    Authorization - Visit Number  7    Authorization - Number of Visits  10    PT Start Time  0822    PT Stop Time  0902    PT Time Calculation (min)  40 min    Activity Tolerance  Patient tolerated treatment well    Behavior During Therapy  [facility] for tasks assessed/performed       Past Medical History:  Diagnosis Date  . Chronic atrial fibrillation   . Dyslipidemia   . Epistaxis   . Hypertension     Past Surgical History:  Procedure Laterality Date  . SHOULDER SURGERY     Right    There were no vitals filed for this visit.     Wound Therapy - 10/27/18 1000    Subjective  Patient arrives with dressing intact and sock on his Rt foot. His toe wraps did come unravelled slightly.    Patient and Family Stated Goals  wound to heal    Date of Onset  08/18/18   approximate   Prior Treatments  stitches by Dr. [place] Crane    Pain Scale  0-10    Pain Score  0-No pain    Evaluation and Treatment Procedures Explained to Patient/Family  Yes    Evaluation and Treatment Procedures  agreed to    Wound Properties Date First Assessed: 09/02/18 Time First Assessed: 0910 Wound Type: Laceration Location: Leg Location Orientation: Right;Lower;Posterior Wound Description (Comments): laceration of Rt calf Present on Admission: Yes   Dressing Type  Compression wrap;Gauze (Comment)   alginate, gauze and profore with  netting   Dressing Changed  Changed    Dressing Status  Old drainage    Dressing Change Frequency  PRN    Site / Wound Assessment  Red;Yellow    % Wound base Red or Granulating  100%   small patches of dermis visible, resurfacing present   % Wound base Yellow/Fibrinous Exudate  0%    % Wound base Black/Eschar  0%    Peri-wound Assessment  Edema   periwound - 10cm x 9cm   Margins  Attached edges (approximated)    Closure  None    Drainage Amount  Moderate    Drainage Description  Serous    Treatment  Cleansed    Wound Properties Date First Assessed: 10/15/18 Time First Assessed: 0850 Wound Type: Non-pressure wound Location: Leg Location Orientation: Right;Anterior   Dressing Type  Gauze (Comment);Compression wrap    Dressing Changed  Changed    Dressing Status  Clean;Dry;Intact    Dressing Change Frequency  PRN    Site / Wound Assessment  Red;Yellow    % Wound base Red or Granulating  95%   small patches of dermis visible, resurfacing present   % Wound base Yellow/Fibrinous Exudate  5%    Peri-wound Assessment  Intact  Wound Length (cm)  2.6 cm    Wound Width (cm)  2.8 cm    Wound Depth (cm)  0 cm    Wound Volume (cm^3)  0 cm^3    Wound Surface Area (cm^2)  7.28 cm^2    Drainage Amount  Scant    Drainage Description  Serous    Treatment  Cleansed    Wound Properties Date First Assessed: 10/15/18 Wound Type: Non-pressure wound Location: Leg Location Orientation: Posterior;Medial Final Assessment Date: 10/22/18 Final Assessment Time: 0915   Closure  Approximated    Selective Debridement - Location  --    Selective Debridement - Tools Used  --    Selective Debridement - Tissue Removed  --    Wound Therapy - Clinical Statement  Patient wound has improved overall with reduced exudate and no maceration. His skin surrounding the original laceration is denudated with small spots of granulation. Overall area has less serous exudate and no erythema is present; however, it is not  decreasing in size. Observed 1 white pimple in the area and this therapist is suspicious of a fungal infection which is preventing full wound closure. Wound was cleansed today and xeroform applied as dressings were dried and attached to wound at removal. Conintued with profore wrap to reduce edema. This therapist instructed the patient to obtain an over the counter anti-fungal cream for a 2-week trial of this. If this does not improve the wound will refer back to MD.     Wound Therapy - Functional Problem List  difficulty walking, pain    Factors Delaying/Impairing Wound Healing  Infection - systemic/local    Hydrotherapy Plan  Debridement;Dressing change;Patient/family education;Pulsatile lavage with suction    Wound Therapy - Frequency  2X / week    Wound Therapy - Current Recommendations  PT    Wound Plan  continue per current POC, keeping perimeter dry and wound moist.     Dressing   xeroform, profore wrap, petrolleum applied to intact periwound         PT Short Term Goals - 09/24/18 0902      PT SHORT TERM GOAL #1   Title  Patient will verbalize signs/symptoms of local and systemic infection to improve safety and awareness of risk of infection with open wound and when to seek medical attention.    Time  2    Period  Weeks    Status  On-going        PT Long Term Goals - 09/24/18 0902      PT LONG TERM GOAL #1   Title  Wound will decresaed by 75% in size to demonstrate wound healing/approximation.    Time  6    Period  Weeks    Status  On-going      PT LONG TERM GOAL #2   Title  Wound will have 100% granulation tissue to improve healthy environment of wound bed and promote healing/wound closure.    Time  6    Period  Weeks    Status  On-going       Plan - 10/27/18 1349    Clinical Impression Statement  see above    Rehab Potential  Good    PT Frequency  2x / week    PT Duration  2 weeks   2 additional weeks   PT Treatment/Interventions  ADLs/Self Care Home  Management;Taping;Manual techniques;Other (comment)   wound care, cleansing and debridement   PT Next Visit Plan  see above  Consulted and Agree with Plan of Care  Patient       Patient will benefit from skilled therapeutic intervention in order to improve the following deficits and impairments:  Decreased skin integrity, Other (comment), Increased edema(risk of infection)  Visit Diagnosis: Laceration of skin of right lower leg, initial encounter  Open wound of right lower leg, initial encounter  Localized edema     Problem List Patient Active Problem List   Diagnosis Date Noted  . Long term (current) use of anticoagulants 03/14/2011  . HYPOTHYROIDISM 12/26/2009  . HYPERLIPIDEMIA 12/26/2009  . ATRIAL FIBRILLATION 12/26/2009  . SHOULDER PAIN, LEFT 12/26/2009     Kipp Brood, PT, DPT Physical Therapist with [facility]  10/27/2018 1:51 PM    [place], [place], [address] Phone: [phone]   Fax:  [phone]  Name: William Sandoval MRN: [id number] Date of Birth: September 27, 1946

## 2018-10-27 NOTE — Patient Instructions (Signed)
    [facility] Clotrimazole Antifungal Cream - 0.5 Oz

## 2018-11-03 ENCOUNTER — Ambulatory Visit (HOSPITAL_COMMUNITY): Payer: Medicare HMO | Attending: Internal Medicine | Admitting: Physical Therapy

## 2018-11-03 DIAGNOSIS — S81811A Laceration without foreign body, right lower leg, initial encounter: Secondary | ICD-10-CM | POA: Diagnosis not present

## 2018-11-03 DIAGNOSIS — S81801A Unspecified open wound, right lower leg, initial encounter: Secondary | ICD-10-CM

## 2018-11-03 DIAGNOSIS — R600 Localized edema: Secondary | ICD-10-CM | POA: Diagnosis not present

## 2018-11-03 NOTE — Therapy (Signed)
[place], [place], [address] Phone: [phone]   Fax:  [phone]  Wound Care Therapy  Patient Details  Name: William Sandoval MRN: [id number] Date of Birth: Apr 22, 1946 Referring Provider (PT): Celene Squibb, MD   Encounter Date: 11/03/2018    Past Medical History:  Diagnosis Date  . Chronic atrial fibrillation   . Dyslipidemia   . Epistaxis   . Hypertension     Past Surgical History:  Procedure Laterality Date  . SHOULDER SURGERY     Right    There were no vitals filed for this visit.              Wound Therapy - 11/03/18 0942    Subjective  pt comes today with antifungal purchased from store.  No issues or pain.    Patient and Family Stated Goals  wound to heal    Date of Onset  08/18/18   approximate   Prior Treatments  stitches by Dr. [place] Crane    Pain Scale  0-10    Pain Score  0-No pain    Evaluation and Treatment Procedures Explained to Patient/Family  Yes    Evaluation and Treatment Procedures  agreed to    Wound Properties Date First Assessed: 09/02/18 Time First Assessed: 0910 Wound Type: Laceration Location: Leg Location Orientation: Right;Lower;Posterior Wound Description (Comments): laceration of Rt calf Present on Admission: Yes   Dressing Type  Compression wrap;Gauze (Comment)   alginate, gauze and profore with netting   Dressing Changed  Changed    Dressing Status  Old drainage    Dressing Change Frequency  PRN    Site / Wound Assessment  Red;Yellow    % Wound base Red or Granulating  100%   small patches of dermis visible, resurfacing present   % Wound base Yellow/Fibrinous Exudate  0%    % Wound base Black/Eschar  0%    Peri-wound Assessment  Intact   periwound - 10cm x 9cm   Margins  Attached edges (approximated)    Closure  None    Drainage Amount  Scant    Drainage Description  Serous    Treatment  Cleansed    Wound Properties Date First Assessed: 10/15/18 Time  First Assessed: 0850 Wound Type: Non-pressure wound Location: Leg Location Orientation: Right;Anterior   Dressing Type  Gauze (Comment);Compression wrap    Dressing Changed  Changed    Dressing Status  Clean;Dry;Intact    Dressing Change Frequency  PRN    Site / Wound Assessment  Red;Yellow    % Wound base Red or Granulating  100%   small patches of dermis visible, resurfacing present   % Wound base Yellow/Fibrinous Exudate  0%    Peri-wound Assessment  Intact    Drainage Amount  None    Drainage Description  Serous    Treatment  Cleansed    Wound Properties Date First Assessed: 10/15/18 Wound Type: Non-pressure wound Location: Leg Location Orientation: Posterior;Medial Final Assessment Date: 10/22/18 Final Assessment Time: 0915   Closure  Approximated    Treatment  Cleansed    Wound Therapy - Clinical Statement  Overall healed with dry patches, little to no drainage.  Cleased well and applied antifungal to areas only.  Covered wtih profore contact layer and moisturized periemeter of areas.  Contineud with profore.  Anticipate full healing next week.     Wound Therapy - Functional Problem List  difficulty walking, pain    Factors Delaying/Impairing Wound  Healing  Infection - systemic/local    Hydrotherapy Plan  Debridement;Dressing change;Patient/family education;Pulsatile lavage with suction    Wound Therapy - Frequency  2X / week    Wound Therapy - Current Recommendations  PT    Wound Plan  continue per current POC, keeping perimeter dry and wound moist.     Dressing   antifungal. profore contact layer, profore                PT Short Term Goals - 09/24/18 0902      PT SHORT TERM GOAL #1   Title  Patient will verbalize signs/symptoms of local and systemic infection to improve safety and awareness of risk of infection with open wound and when to seek medical attention.    Time  2    Period  Weeks    Status  On-going        PT Long Term Goals - 09/24/18 0902      PT  LONG TERM GOAL #1   Title  Wound will decresaed by 75% in size to demonstrate wound healing/approximation.    Time  6    Period  Weeks    Status  On-going      PT LONG TERM GOAL #2   Title  Wound will have 100% granulation tissue to improve healthy environment of wound bed and promote healing/wound closure.    Time  6    Period  Weeks    Status  On-going              Patient will benefit from skilled therapeutic intervention in order to improve the following deficits and impairments:     Visit Diagnosis: Laceration of skin of right lower leg, initial encounter  Open wound of right lower leg, initial encounter  Localized edema     Problem List Patient Active Problem List   Diagnosis Date Noted  . Long term (current) use of anticoagulants 03/14/2011  . HYPOTHYROIDISM 12/26/2009  . HYPERLIPIDEMIA 12/26/2009  . ATRIAL FIBRILLATION 12/26/2009  . SHOULDER PAIN, LEFT 12/26/2009   Teena Irani, PTA/CLT [phone]  Teena Irani 11/03/2018, 9:46 AM  [place], [place], [address] Phone: [phone]   Fax:  [phone]  Name: TEOMAN GIRAUD MRN: [id number] Date of Birth: [birth year]

## 2018-11-05 ENCOUNTER — Ambulatory Visit (HOSPITAL_COMMUNITY): Payer: Medicare HMO | Admitting: Physical Therapy

## 2018-11-05 DIAGNOSIS — R600 Localized edema: Secondary | ICD-10-CM

## 2018-11-05 DIAGNOSIS — S81801A Unspecified open wound, right lower leg, initial encounter: Secondary | ICD-10-CM | POA: Diagnosis not present

## 2018-11-05 DIAGNOSIS — S81811A Laceration without foreign body, right lower leg, initial encounter: Secondary | ICD-10-CM

## 2018-11-05 NOTE — Therapy (Signed)
[place], [place], [address] Phone: [phone]   Fax:  [phone]  Wound Care Therapy  Patient Details  Name: William Sandoval MRN: [id number] Date of Birth: 1946/05/24 Referring Provider (PT): Celene Squibb, MD   Encounter Date: 11/05/2018  PT End of Session - 11/05/18 0949    Visit Number  16    Number of Visits  17    Date for PT Re-Evaluation  10/30/18    Authorization Type  [employer] (no auth required, visits on medical necessity)    Authorization Time Period  09/02/18 - 10/16/18; 10/16/18-10/30/18    Authorization - Visit Number  7    Authorization - Number of Visits  10    PT Start Time  0900    PT Stop Time  0918    PT Time Calculation (min)  18 min    Activity Tolerance  Patient tolerated treatment well    Behavior During Therapy  [facility] for tasks assessed/performed       Past Medical History:  Diagnosis Date  . Chronic atrial fibrillation   . Dyslipidemia   . Epistaxis   . Hypertension     Past Surgical History:  Procedure Laterality Date  . SHOULDER SURGERY     Right    There were no vitals filed for this visit.              Wound Therapy - 11/05/18 0946    Subjective  Pt reports his leg is feeling better.  No pain or issues.    Patient and Family Stated Goals  wound to heal    Date of Onset  08/18/18   approximate   Prior Treatments  stitches by Dr. [place] Crane    Pain Scale  0-10    Pain Score  0-No pain    Evaluation and Treatment Procedures Explained to Patient/Family  Yes    Evaluation and Treatment Procedures  agreed to    Wound Properties Date First Assessed: 09/02/18 Time First Assessed: 0910 Wound Type: Laceration Location: Leg Location Orientation: Right;Lower;Posterior Wound Description (Comments): laceration of Rt calf Present on Admission: Yes   Dressing Type  Compression wrap;Gauze (Comment)   alginate, gauze and profore with netting   Dressing Changed   Changed    Dressing Status  Old drainage    Dressing Change Frequency  PRN    Site / Wound Assessment  Red;Yellow    % Wound base Red or Granulating  100%   small patches of dermis visible, resurfacing present   % Wound base Yellow/Fibrinous Exudate  0%    % Wound base Black/Eschar  0%    Peri-wound Assessment  Intact   periwound - 10cm x 9cm   Margins  Attached edges (approximated)    Closure  None    Drainage Amount  Scant    Drainage Description  Serous    Treatment  Cleansed    Wound Properties Date First Assessed: 10/15/18 Time First Assessed: 0850 Wound Type: Non-pressure wound Location: Leg Location Orientation: Right;Anterior   Dressing Type  Gauze (Comment);Compression wrap    Dressing Changed  Changed    Dressing Status  Clean;Dry;Intact    Dressing Change Frequency  PRN    Site / Wound Assessment  Red;Yellow    % Wound base Red or Granulating  100%   small patches of dermis visible, resurfacing present   % Wound base Yellow/Fibrinous Exudate  0%    Peri-wound  Assessment  Intact    Drainage Amount  None    Drainage Description  Serous    Treatment  Cleansed    Wound Properties Date First Assessed: 10/15/18 Wound Type: Non-pressure wound Location: Leg Location Orientation: Posterior;Medial Final Assessment Date: 10/22/18 Final Assessment Time: 0915   Closure  Approximated    Treatment  Cleansed    Wound Therapy - Clinical Statement  Contiued improvement with only a few broken areas but with scant drainage.  Anticipate full healing next session.  Cleansed well, moisturized and continued use of antifungal and profore dressing.     Wound Therapy - Functional Problem List  difficulty walking, pain    Factors Delaying/Impairing Wound Healing  Infection - systemic/local    Hydrotherapy Plan  Debridement;Dressing change;Patient/family education;Pulsatile lavage with suction    Wound Therapy - Frequency  2X / week    Wound Therapy - Current Recommendations  PT    Wound Plan   continue per current POC, keeping perimeter dry and wound moist. Anticipate healing and discharge next session.    Dressing   antifungal. profore contact layer, profore                PT Short Term Goals - 09/24/18 0902      PT SHORT TERM GOAL #1   Title  Patient will verbalize signs/symptoms of local and systemic infection to improve safety and awareness of risk of infection with open wound and when to seek medical attention.    Time  2    Period  Weeks    Status  On-going        PT Long Term Goals - 09/24/18 0902      PT LONG TERM GOAL #1   Title  Wound will decresaed by 75% in size to demonstrate wound healing/approximation.    Time  6    Period  Weeks    Status  On-going      PT LONG TERM GOAL #2   Title  Wound will have 100% granulation tissue to improve healthy environment of wound bed and promote healing/wound closure.    Time  6    Period  Weeks    Status  On-going              Patient will benefit from skilled therapeutic intervention in order to improve the following deficits and impairments:     Visit Diagnosis: Laceration of skin of right lower leg, initial encounter  Open wound of right lower leg, initial encounter  Localized edema     Problem List Patient Active Problem List   Diagnosis Date Noted  . Long term (current) use of anticoagulants 03/14/2011  . HYPOTHYROIDISM 12/26/2009  . HYPERLIPIDEMIA 12/26/2009  . ATRIAL FIBRILLATION 12/26/2009  . SHOULDER PAIN, LEFT 12/26/2009    Roseanne Reno B 11/05/2018, 9:51 AM  [place], [place], [address] Phone: [phone]   Fax:  [phone]  Name: William Sandoval MRN: [id number] Date of Birth: 1946-02-21

## 2018-11-05 NOTE — Addendum Note (Signed)
Addended by: Jacques Navy on: 11/05/2018 10:49 AM   Modules accepted: Orders

## 2018-11-06 DIAGNOSIS — I4891 Unspecified atrial fibrillation: Secondary | ICD-10-CM | POA: Diagnosis not present

## 2018-11-06 DIAGNOSIS — Z7901 Long term (current) use of anticoagulants: Secondary | ICD-10-CM | POA: Diagnosis not present

## 2018-11-10 ENCOUNTER — Ambulatory Visit (HOSPITAL_COMMUNITY): Payer: Medicare HMO | Admitting: Physical Therapy

## 2018-11-10 DIAGNOSIS — R600 Localized edema: Secondary | ICD-10-CM

## 2018-11-10 DIAGNOSIS — S81811A Laceration without foreign body, right lower leg, initial encounter: Secondary | ICD-10-CM | POA: Diagnosis not present

## 2018-11-10 DIAGNOSIS — S81801A Unspecified open wound, right lower leg, initial encounter: Secondary | ICD-10-CM

## 2018-11-10 NOTE — Therapy (Signed)
[place], [place], [address] Phone: [phone]   Fax:  [phone]  Wound Care Therapy  Patient Details  Name: William Sandoval MRN: [id number] Date of Birth: 18-Oct-1946 Referring Provider (PT): Celene Squibb, MD   Encounter Date: 11/10/2018  PT End of Session - 11/10/18 0903    Visit Number  17    Number of Visits  17    Date for PT Re-Evaluation  11/13/18    Authorization Type  [employer] (no auth required, visits on medical necessity)    Authorization Time Period  09/02/18 - 10/16/18; 10/16/18-10/30/18.  cert 63/78-58/85    Authorization - Visit Number  7    Authorization - Number of Visits  10    PT Start Time  0818    PT Stop Time  0850    PT Time Calculation (min)  32 min    Activity Tolerance  Patient tolerated treatment well    Behavior During Therapy  [facility] for tasks assessed/performed       Past Medical History:  Diagnosis Date  . Chronic atrial fibrillation   . Dyslipidemia   . Epistaxis   . Hypertension     Past Surgical History:  Procedure Laterality Date  . SHOULDER SURGERY     Right    There were no vitals filed for this visit.              Wound Therapy - 11/10/18 0856    Subjective  Pt reports his leg is feeling better.  No pain or issues.    Patient and Family Stated Goals  wound to heal    Date of Onset  08/18/18   approximate   Prior Treatments  stitches by Dr. [place] Crane    Pain Scale  0-10    Pain Score  0-No pain    Evaluation and Treatment Procedures Explained to Patient/Family  Yes    Evaluation and Treatment Procedures  agreed to    Wound Properties Date First Assessed: 09/02/18 Time First Assessed: 0910 Wound Type: Laceration Location: Leg Location Orientation: Right;Lower;Posterior Wound Description (Comments): laceration of Rt calf Present on Admission: Yes   Dressing Type  Compression wrap;Gauze (Comment)   alginate, gauze and profore with netting    Dressing Changed  Changed    Dressing Status  Old drainage    Dressing Change Frequency  PRN    Site / Wound Assessment  Red;Yellow    % Wound base Red or Granulating  100%   small patches of dermis visible, resurfacing present   % Wound base Yellow/Fibrinous Exudate  0%    % Wound base Black/Eschar  0%    Peri-wound Assessment  Intact   periwound - 10cm x 9cm   Margins  Attached edges (approximated)    Closure  None    Drainage Amount  Scant    Drainage Description  Serous    Treatment  Cleansed;Debridement (Selective)    Wound Properties Date First Assessed: 10/15/18 Time First Assessed: 0850 Wound Type: Non-pressure wound Location: Leg Location Orientation: Right;Anterior   Dressing Type  Gauze (Comment);Compression wrap    Dressing Changed  Changed    Dressing Status  Clean;Dry;Intact    Dressing Change Frequency  PRN    Site / Wound Assessment  Red;Yellow    % Wound base Red or Granulating  100%   small patches of dermis visible, resurfacing present   % Wound base Yellow/Fibrinous Exudate  0%  Peri-wound Assessment  Intact    Drainage Amount  None    Drainage Description  Serous    Treatment  Cleansed;Debridement (Selective)    Wound Properties Date First Assessed: 10/15/18 Wound Type: Non-pressure wound Location: Leg Location Orientation: Posterior;Medial Final Assessment Date: 10/22/18 Final Assessment Time: 0915   Closure  Approximated    Treatment  Cleansed    Wound Therapy - Clinical Statement  3 small areas remain where skin is broken, irritated.  Remaining area still slightly raw, inflammed but overall much improved.  Debrided dry skin and devitalized tissue from all areas, cleansed well and continued use of antifungal to remaining areas.  Moisturized LE well.  Used profore lite this session as profore regular was not available.  Anticipate discharge by next week if continues to improve.      Wound Therapy - Functional Problem List  difficulty walking, pain    Factors  Delaying/Impairing Wound Healing  Infection - systemic/local    Hydrotherapy Plan  Debridement;Dressing change;Patient/family education;Pulsatile lavage with suction    Wound Therapy - Frequency  2X / week    Wound Therapy - Current Recommendations  PT    Wound Plan  continue per current POC, keeping perimeter dry and wound moist. Anticipate healing and discharge within next couple sessions.    Dressing   antifungal. profore contact layer, profore                PT Short Term Goals - 09/24/18 0902      PT SHORT TERM GOAL #1   Title  Patient will verbalize signs/symptoms of local and systemic infection to improve safety and awareness of risk of infection with open wound and when to seek medical attention.    Time  2    Period  Weeks    Status  On-going        PT Long Term Goals - 09/24/18 0902      PT LONG TERM GOAL #1   Title  Wound will decresaed by 75% in size to demonstrate wound healing/approximation.    Time  6    Period  Weeks    Status  On-going      PT LONG TERM GOAL #2   Title  Wound will have 100% granulation tissue to improve healthy environment of wound bed and promote healing/wound closure.    Time  6    Period  Weeks    Status  On-going              Patient will benefit from skilled therapeutic intervention in order to improve the following deficits and impairments:     Visit Diagnosis: Laceration of skin of right lower leg, initial encounter  Open wound of right lower leg, initial encounter  Localized edema     Problem List Patient Active Problem List   Diagnosis Date Noted  . Long term (current) use of anticoagulants 03/14/2011  . HYPOTHYROIDISM 12/26/2009  . HYPERLIPIDEMIA 12/26/2009  . ATRIAL FIBRILLATION 12/26/2009  . SHOULDER PAIN, LEFT 12/26/2009   Teena Irani, PTA/CLT [phone]  Teena Irani 11/10/2018, 9:04 AM  [place], [place],  [address] Phone: [phone]   Fax:  [phone]  Name: William Sandoval MRN: [id number] Date of Birth: [birth year]

## 2018-11-12 ENCOUNTER — Ambulatory Visit (HOSPITAL_COMMUNITY): Payer: Medicare HMO

## 2018-11-12 ENCOUNTER — Other Ambulatory Visit: Payer: Self-pay

## 2018-11-12 ENCOUNTER — Encounter (HOSPITAL_COMMUNITY): Payer: Self-pay

## 2018-11-12 DIAGNOSIS — S81801A Unspecified open wound, right lower leg, initial encounter: Secondary | ICD-10-CM | POA: Diagnosis not present

## 2018-11-12 DIAGNOSIS — S81811A Laceration without foreign body, right lower leg, initial encounter: Secondary | ICD-10-CM

## 2018-11-12 DIAGNOSIS — R600 Localized edema: Secondary | ICD-10-CM

## 2018-11-12 NOTE — Therapy (Signed)
[place], [place], [address] Phone: [phone]   Fax:  [phone]  Wound Care Therapy  Patient Details  Name: William Sandoval MRN: [id number] Date of Birth: 27-Jun-1946 Referring Provider (PT): Celene Squibb, MD   Encounter Date: 11/12/2018  PT End of Session - 11/12/18 0931    Visit Number  18    Number of Visits  20    Date for PT Re-Evaluation  11/13/18    Authorization Type  [employer] (no auth required, visits on medical necessity)    Authorization Time Period  09/02/18 - 10/16/18; 10/16/18-10/30/18.  cert 93/90-30/09    Authorization - Visit Number  1    Authorization - Number of Visits  10    PT Start Time  0853    PT Stop Time  0925    PT Time Calculation (min)  32 min    Activity Tolerance  Patient tolerated treatment well    Behavior During Therapy  [facility] for tasks assessed/performed       Past Medical History:  Diagnosis Date  . Chronic atrial fibrillation   . Dyslipidemia   . Epistaxis   . Hypertension     Past Surgical History:  Procedure Laterality Date  . SHOULDER SURGERY     Right    There were no vitals filed for this visit.    Wound Therapy - 11/12/18 0932    Subjective  Patient reports he is hoping to be done with therapy soon. He denies pain today.    Patient and Family Stated Goals  wound to heal    Date of Onset  08/18/18   approximate   Prior Treatments  stitches by Dr. [place] Crane    Pain Scale  0-10    Pain Score  0-No pain    Evaluation and Treatment Procedures Explained to Patient/Family  Yes    Evaluation and Treatment Procedures  agreed to    Wound Properties Date First Assessed: 09/02/18 Time First Assessed: 0910 Wound Type: Laceration Location: Leg Location Orientation: Right;Lower;Posterior Wound Description (Comments): laceration of Rt calf Present on Admission: Yes   Dressing Type  Gauze (Comment);Other (Comment)   antifungal cream and gauze wrap   Dressing  Changed  Changed    Dressing Status  Old drainage    Dressing Change Frequency  PRN    Site / Wound Assessment  Red;Yellow    % Wound base Red or Granulating  100%   small patches of dermis visible, resurfacing present   % Wound base Yellow/Fibrinous Exudate  0%    % Wound base Black/Eschar  0%    Peri-wound Assessment  Intact   periwound - 9cm x 8.8cm (area of skin breakdown)   Margins  Attached edges (approximated)    Closure  None    Drainage Amount  Scant    Drainage Description  Serous    Treatment  Cleansed;Debridement (Selective)    Wound Properties Date First Assessed: 10/15/18 Time First Assessed: 0850 Wound Type: Non-pressure wound Location: Leg Location Orientation: Right;Anterior   Dressing Type  Gauze (Comment)   antifungal creama nd gauze wrap   Dressing Changed  Changed    Dressing Status  Clean;Dry;Intact    Dressing Change Frequency  PRN    Site / Wound Assessment  Red;Yellow    % Wound base Red or Granulating  100%   small patches of dermis visible, resurfacing present   % Wound base Yellow/Fibrinous Exudate  0%    Peri-wound Assessment  Intact    Wound Length (cm)  2.4 cm   2.6   Wound Width (cm)  2.8 cm   2.8   Wound Depth (cm)  0 cm    Wound Volume (cm^3)  0 cm^3    Wound Surface Area (cm^2)  6.72 cm^2    Drainage Amount  None    Drainage Description  Serous    Treatment  Cleansed;Debridement (Selective)    Wound Properties Date First Assessed: 10/15/18 Wound Type: Non-pressure wound Location: Leg Location Orientation: Posterior;Medial Final Assessment Date: 10/22/18 Final Assessment Time: 0915   Wound Length (cm)  0 cm    Wound Width (cm)  0 cm    Wound Depth (cm)  0 cm    Wound Volume (cm^3)  0 cm^3    Wound Surface Area (cm^2)  0 cm^2    Closure  Approximated    Treatment  Cleansed    Selective Debridement - Location  dried exudate    Selective Debridement - Tissue Removed  dried exudate and dead skin    Wound Therapy - Clinical Statement   Patient's skin is improving with reduced edema around posterior leg where original laceration was present and with reduced errythema. He has 3 small patches of skin breakdown/irritation remaining in are around laceration and around anterior ankle. Continued with cleansing and debridement of devitalized tissue. Applied antifungal cream to all areas of breakdown and covered with gauze and gauze wrap. No compression applied today as patient has been instructed to cleanse skin and apply cream 2x/day and cover with gauze until next appointments. These are the manufacturer guidelines. He verbalized his understanding and appointments made for next to follow up on 2 week trial of antifungal cream application. If no improvement is noted by next week patient should be referred back to MD for further lab work to diagnose possible underlying conditions.    Wound Therapy - Functional Problem List  difficulty walking, pain    Factors Delaying/Impairing Wound Healing  Infection - systemic/local    Hydrotherapy Plan  Debridement;Dressing change;Patient/family education;Pulsatile lavage with suction    Wound Therapy - Frequency  2X / week   1 more week   Wound Therapy - Current Recommendations  PT    Wound Plan  Continue with cleansing and antifungal cream application. If no improvement is noted by next week patient should be referred back to MD for further lab work to diagnose possible underlying conditions.    Dressing   antifungal cream and gauze wrap.        PT Education - 11/12/18 [id number]    Education Details  Educated on proper self care with cleansing with gentle soap and water and then how to apply the antifungal cream and wrap with gauze. Educated to do this in the morning and evening every day.    Person(s) Educated  Patient    Methods  Explanation;Other (comment)   provided materials for dressing   Comprehension  Verbalized understanding       PT Short Term Goals - 09/24/18 0902      PT SHORT TERM  GOAL #1   Title  Patient will verbalize signs/symptoms of local and systemic infection to improve safety and awareness of risk of infection with open wound and when to seek medical attention.    Time  2    Period  Weeks    Status  On-going        PT Long Term Goals -  09/24/18 0902      PT LONG TERM GOAL #1   Title  Wound will decresaed by 75% in size to demonstrate wound healing/approximation.    Time  6    Period  Weeks    Status  On-going      PT LONG TERM GOAL #2   Title  Wound will have 100% granulation tissue to improve healthy environment of wound bed and promote healing/wound closure.    Time  6    Period  Weeks    Status  On-going        Plan - 11/12/18 0931    Clinical Impression Statement  see above    Rehab Potential  Good    PT Frequency  2x / week    PT Duration  2 weeks   2 additional weeks   PT Treatment/Interventions  ADLs/Self Care Home Management;Taping;Manual techniques;Other (comment)   wound care, cleansing and debridement   PT Next Visit Plan  see above    Consulted and Agree with Plan of Care  Patient       Patient will benefit from skilled therapeutic intervention in order to improve the following deficits and impairments:  Decreased skin integrity, Other (comment), Increased edema(risk of infection)  Visit Diagnosis: Laceration of skin of right lower leg, initial encounter  Open wound of right lower leg, initial encounter  Localized edema     Problem List Patient Active Problem List   Diagnosis Date Noted  . Long term (current) use of anticoagulants 03/14/2011  . HYPOTHYROIDISM 12/26/2009  . HYPERLIPIDEMIA 12/26/2009  . ATRIAL FIBRILLATION 12/26/2009  . SHOULDER PAIN, LEFT 12/26/2009    Kipp Brood, PT, DPT Physical Therapist with [facility]  11/12/2018 10:03 AM    [place], [place], [address] Phone: [phone]   Fax:   [phone]  Name: William Sandoval MRN: [id number] Date of Birth: 31-Jul-1946

## 2018-11-17 ENCOUNTER — Ambulatory Visit (HOSPITAL_COMMUNITY): Payer: Medicare HMO | Admitting: Physical Therapy

## 2018-11-17 ENCOUNTER — Ambulatory Visit (HOSPITAL_COMMUNITY): Payer: Medicare HMO

## 2018-11-17 DIAGNOSIS — S81811A Laceration without foreign body, right lower leg, initial encounter: Secondary | ICD-10-CM

## 2018-11-17 DIAGNOSIS — R600 Localized edema: Secondary | ICD-10-CM

## 2018-11-17 DIAGNOSIS — S81801A Unspecified open wound, right lower leg, initial encounter: Secondary | ICD-10-CM

## 2018-11-17 NOTE — Therapy (Signed)
[place], [place], [address] Phone: [phone]   Fax:  [phone]  Wound Care Therapy/Discharge Summary  Patient Details  Name: William Sandoval MRN: [id number] Date of Birth: 02/22/1946 Referring Provider (PT): Celene Squibb, MD   Encounter Date: 11/17/2018   PHYSICAL THERAPY DISCHARGE SUMMARY  Visits from Start of Care: 19  Current functional level related to goals / functional outcomes: Patient arrived to visit with no dressing on and all wounds had healed. He was educated on proper skin cleansing and moisturizing to improve skin integrity. He is being discharged from episode of care as all goals are met and no wounds are present any longer.   Remaining deficits: See below details.   Education / Equipment: edcuated to continue to keep area protected, clean and moisturized.  No need to keep gauze covering at this point.  May want to check into compression hose in the future if begins to swell.      Plan: Patient agrees to discharge.  Patient goals were met. Patient is being discharged due to meeting the stated rehab goals.  ?????     Kipp Brood, PT, DPT Physical Therapist with [facility]  11/23/2018 9:21 AM     Past Medical History:  Diagnosis Date  . Chronic atrial fibrillation   . Dyslipidemia   . Epistaxis   . Hypertension     Past Surgical History:  Procedure Laterality Date  . SHOULDER SURGERY     Right    There were no vitals filed for this visit.              Wound Therapy - 11/17/18 1635    Subjective  pt states no issues and no longer with wounds    Pain Scale  0-10    Pain Score  0-No pain    Wound Therapy - Clinical Statement  Pt arrives today without dressing and no wound present.  Now 100% healed with no further skilled need.     Wound Plan  discharge as no longer skilled need              PT Education - 11/17/18 1636    Education Details  edcuated to continue to keep area protected, clean and moisturized.  No need to keep gauze covering at this point.  May want to check into compression hose in the future if begins to swell.      Person(s) Educated  Patient    Methods  Explanation    Comprehension  Verbalized understanding       PT Short Term Goals - 11/17/18 1636      PT SHORT TERM GOAL #1   Title  Patient will verbalize signs/symptoms of local and systemic infection to improve safety and awareness of risk of infection with open wound and when to seek medical attention.    Time  2    Period  Weeks    Status  Achieved        PT Long Term Goals - 11/17/18 1636      PT LONG TERM GOAL #1   Title  Wound will decresaed by 75% in size to demonstrate wound healing/approximation.    Time  6    Period  Weeks    Status  Achieved      PT LONG TERM GOAL #2   Title  Wound will have 100% granulation tissue to improve healthy environment of wound bed and promote  healing/wound closure.    Time  6    Period  Weeks    Status  Achieved             11/17/18 1640  Plan  Clinical Impression Statement see above  Pt will benefit from skilled therapeutic intervention in order to improve on the following deficits Decreased skin integrity;Other (comment);Increased edema (risk of infection)  Rehab Potential Good  PT Frequency 2x / week  PT Duration 2 weeks (2 additional weeks)  PT Treatment/Interventions ADLs/Self Care Home Management;Taping;Manual techniques;Other (comment) (wound care, cleansing and debridement)  PT Next Visit Plan see above. pt being discharged  Consulted and Agree with Plan of Care Patient       Patient will benefit from skilled therapeutic intervention in order to improve the following deficits and impairments:  Decreased skin integrity, Other (comment), Increased edema(risk of infection)  Visit Diagnosis: Laceration of skin of right lower leg, initial encounter  Open wound of  right lower leg, initial encounter  Localized edema     Problem List Patient Active Problem List   Diagnosis Date Noted  . Long term (current) use of anticoagulants 03/14/2011  . HYPOTHYROIDISM 12/26/2009  . HYPERLIPIDEMIA 12/26/2009  . ATRIAL FIBRILLATION 12/26/2009  . SHOULDER PAIN, LEFT 12/26/2009   Teena Irani, PTA/CLT [phone]  Teena Irani 11/17/2018, 4:38 PM  [place] [address] [place], [place], [address] Phone: [phone]   Fax:  [phone]  Name: TOUA STITES MRN: [id number] Date of Birth: [birth year]

## 2018-11-19 ENCOUNTER — Ambulatory Visit (HOSPITAL_COMMUNITY): Payer: Medicare HMO | Admitting: Physical Therapy

## 2018-12-04 DIAGNOSIS — M25512 Pain in left shoulder: Secondary | ICD-10-CM | POA: Diagnosis not present

## 2018-12-04 DIAGNOSIS — I4819 Other persistent atrial fibrillation: Secondary | ICD-10-CM | POA: Diagnosis not present

## 2018-12-04 DIAGNOSIS — Z7901 Long term (current) use of anticoagulants: Secondary | ICD-10-CM | POA: Diagnosis not present

## 2018-12-18 DIAGNOSIS — M25512 Pain in left shoulder: Secondary | ICD-10-CM | POA: Diagnosis not present

## 2018-12-18 DIAGNOSIS — Z7901 Long term (current) use of anticoagulants: Secondary | ICD-10-CM | POA: Diagnosis not present

## 2018-12-18 DIAGNOSIS — I4819 Other persistent atrial fibrillation: Secondary | ICD-10-CM | POA: Diagnosis not present

## 2019-01-15 DIAGNOSIS — I4891 Unspecified atrial fibrillation: Secondary | ICD-10-CM | POA: Diagnosis not present

## 2019-02-12 DIAGNOSIS — Z7901 Long term (current) use of anticoagulants: Secondary | ICD-10-CM | POA: Diagnosis not present

## 2019-02-12 DIAGNOSIS — I482 Chronic atrial fibrillation, unspecified: Secondary | ICD-10-CM | POA: Diagnosis not present

## 2019-03-15 DIAGNOSIS — I482 Chronic atrial fibrillation, unspecified: Secondary | ICD-10-CM | POA: Diagnosis not present

## 2019-03-15 DIAGNOSIS — Z7901 Long term (current) use of anticoagulants: Secondary | ICD-10-CM | POA: Diagnosis not present

## 2019-03-18 DIAGNOSIS — E039 Hypothyroidism, unspecified: Secondary | ICD-10-CM | POA: Diagnosis not present

## 2019-03-18 DIAGNOSIS — E782 Mixed hyperlipidemia: Secondary | ICD-10-CM | POA: Diagnosis not present

## 2019-03-18 DIAGNOSIS — I10 Essential (primary) hypertension: Secondary | ICD-10-CM | POA: Diagnosis not present

## 2019-03-24 DIAGNOSIS — I482 Chronic atrial fibrillation, unspecified: Secondary | ICD-10-CM | POA: Diagnosis not present

## 2019-03-24 DIAGNOSIS — M25512 Pain in left shoulder: Secondary | ICD-10-CM | POA: Diagnosis not present

## 2019-03-24 DIAGNOSIS — E039 Hypothyroidism, unspecified: Secondary | ICD-10-CM | POA: Diagnosis not present

## 2019-03-24 DIAGNOSIS — E782 Mixed hyperlipidemia: Secondary | ICD-10-CM | POA: Diagnosis not present

## 2019-03-24 DIAGNOSIS — Z0001 Encounter for general adult medical examination with abnormal findings: Secondary | ICD-10-CM | POA: Diagnosis not present

## 2019-03-24 DIAGNOSIS — I10 Essential (primary) hypertension: Secondary | ICD-10-CM | POA: Diagnosis not present

## 2019-03-25 DIAGNOSIS — I4891 Unspecified atrial fibrillation: Secondary | ICD-10-CM | POA: Diagnosis not present

## 2019-03-25 DIAGNOSIS — E782 Mixed hyperlipidemia: Secondary | ICD-10-CM | POA: Diagnosis not present

## 2019-03-25 DIAGNOSIS — E039 Hypothyroidism, unspecified: Secondary | ICD-10-CM | POA: Diagnosis not present

## 2019-03-25 DIAGNOSIS — Z7901 Long term (current) use of anticoagulants: Secondary | ICD-10-CM | POA: Diagnosis not present

## 2019-03-25 DIAGNOSIS — I10 Essential (primary) hypertension: Secondary | ICD-10-CM | POA: Diagnosis not present

## 2019-04-12 DIAGNOSIS — E039 Hypothyroidism, unspecified: Secondary | ICD-10-CM | POA: Diagnosis not present

## 2019-04-12 DIAGNOSIS — I482 Chronic atrial fibrillation, unspecified: Secondary | ICD-10-CM | POA: Diagnosis not present

## 2019-04-12 DIAGNOSIS — Z7901 Long term (current) use of anticoagulants: Secondary | ICD-10-CM | POA: Diagnosis not present

## 2019-04-12 DIAGNOSIS — I4891 Unspecified atrial fibrillation: Secondary | ICD-10-CM | POA: Diagnosis not present

## 2019-04-12 DIAGNOSIS — I10 Essential (primary) hypertension: Secondary | ICD-10-CM | POA: Diagnosis not present

## 2019-04-12 DIAGNOSIS — E782 Mixed hyperlipidemia: Secondary | ICD-10-CM | POA: Diagnosis not present

## 2019-04-19 DIAGNOSIS — M19112 Post-traumatic osteoarthritis, left shoulder: Secondary | ICD-10-CM | POA: Diagnosis not present

## 2019-04-19 DIAGNOSIS — M25512 Pain in left shoulder: Secondary | ICD-10-CM | POA: Diagnosis not present

## 2019-05-10 DIAGNOSIS — Z7901 Long term (current) use of anticoagulants: Secondary | ICD-10-CM | POA: Diagnosis not present

## 2019-05-10 DIAGNOSIS — I482 Chronic atrial fibrillation, unspecified: Secondary | ICD-10-CM | POA: Diagnosis not present

## 2019-05-26 DIAGNOSIS — E039 Hypothyroidism, unspecified: Secondary | ICD-10-CM | POA: Diagnosis not present

## 2019-05-26 DIAGNOSIS — I10 Essential (primary) hypertension: Secondary | ICD-10-CM | POA: Diagnosis not present

## 2019-05-26 DIAGNOSIS — E782 Mixed hyperlipidemia: Secondary | ICD-10-CM | POA: Diagnosis not present

## 2019-05-26 DIAGNOSIS — I4891 Unspecified atrial fibrillation: Secondary | ICD-10-CM | POA: Diagnosis not present

## 2019-06-07 DIAGNOSIS — Z7901 Long term (current) use of anticoagulants: Secondary | ICD-10-CM | POA: Diagnosis not present

## 2019-06-07 DIAGNOSIS — R197 Diarrhea, unspecified: Secondary | ICD-10-CM | POA: Diagnosis not present

## 2019-06-07 DIAGNOSIS — I482 Chronic atrial fibrillation, unspecified: Secondary | ICD-10-CM | POA: Diagnosis not present

## 2019-06-23 DIAGNOSIS — I4891 Unspecified atrial fibrillation: Secondary | ICD-10-CM | POA: Diagnosis not present

## 2019-06-23 DIAGNOSIS — E039 Hypothyroidism, unspecified: Secondary | ICD-10-CM | POA: Diagnosis not present

## 2019-06-23 DIAGNOSIS — I10 Essential (primary) hypertension: Secondary | ICD-10-CM | POA: Diagnosis not present

## 2019-06-23 DIAGNOSIS — E782 Mixed hyperlipidemia: Secondary | ICD-10-CM | POA: Diagnosis not present

## 2019-06-23 DIAGNOSIS — Z7901 Long term (current) use of anticoagulants: Secondary | ICD-10-CM | POA: Diagnosis not present

## 2019-07-05 DIAGNOSIS — D6869 Other thrombophilia: Secondary | ICD-10-CM | POA: Diagnosis not present

## 2019-07-05 DIAGNOSIS — Z7901 Long term (current) use of anticoagulants: Secondary | ICD-10-CM | POA: Diagnosis not present

## 2019-07-05 DIAGNOSIS — I482 Chronic atrial fibrillation, unspecified: Secondary | ICD-10-CM | POA: Diagnosis not present

## 2019-07-20 DIAGNOSIS — E782 Mixed hyperlipidemia: Secondary | ICD-10-CM | POA: Diagnosis not present

## 2019-07-20 DIAGNOSIS — Z7901 Long term (current) use of anticoagulants: Secondary | ICD-10-CM | POA: Diagnosis not present

## 2019-07-20 DIAGNOSIS — E039 Hypothyroidism, unspecified: Secondary | ICD-10-CM | POA: Diagnosis not present

## 2019-07-20 DIAGNOSIS — I10 Essential (primary) hypertension: Secondary | ICD-10-CM | POA: Diagnosis not present

## 2019-07-20 DIAGNOSIS — I4891 Unspecified atrial fibrillation: Secondary | ICD-10-CM | POA: Diagnosis not present

## 2019-08-02 DIAGNOSIS — M12819 Other specific arthropathies, not elsewhere classified, unspecified shoulder: Secondary | ICD-10-CM | POA: Insufficient documentation

## 2019-08-02 DIAGNOSIS — M19112 Post-traumatic osteoarthritis, left shoulder: Secondary | ICD-10-CM | POA: Diagnosis not present

## 2019-08-02 DIAGNOSIS — I482 Chronic atrial fibrillation, unspecified: Secondary | ICD-10-CM | POA: Diagnosis not present

## 2019-08-02 DIAGNOSIS — M75100 Unspecified rotator cuff tear or rupture of unspecified shoulder, not specified as traumatic: Secondary | ICD-10-CM | POA: Insufficient documentation

## 2019-08-02 DIAGNOSIS — Z7901 Long term (current) use of anticoagulants: Secondary | ICD-10-CM | POA: Diagnosis not present

## 2019-08-02 DIAGNOSIS — M25512 Pain in left shoulder: Secondary | ICD-10-CM | POA: Diagnosis not present

## 2019-08-02 DIAGNOSIS — D6869 Other thrombophilia: Secondary | ICD-10-CM | POA: Diagnosis not present

## 2019-08-11 DIAGNOSIS — I10 Essential (primary) hypertension: Secondary | ICD-10-CM | POA: Diagnosis not present

## 2019-08-11 DIAGNOSIS — M25512 Pain in left shoulder: Secondary | ICD-10-CM | POA: Diagnosis not present

## 2019-08-11 DIAGNOSIS — I482 Chronic atrial fibrillation, unspecified: Secondary | ICD-10-CM | POA: Diagnosis not present

## 2019-08-11 DIAGNOSIS — E039 Hypothyroidism, unspecified: Secondary | ICD-10-CM | POA: Diagnosis not present

## 2019-08-11 DIAGNOSIS — E782 Mixed hyperlipidemia: Secondary | ICD-10-CM | POA: Diagnosis not present

## 2019-08-19 DIAGNOSIS — I482 Chronic atrial fibrillation, unspecified: Secondary | ICD-10-CM | POA: Diagnosis not present

## 2019-08-19 DIAGNOSIS — E039 Hypothyroidism, unspecified: Secondary | ICD-10-CM | POA: Diagnosis not present

## 2019-08-19 DIAGNOSIS — E782 Mixed hyperlipidemia: Secondary | ICD-10-CM | POA: Diagnosis not present

## 2019-08-19 DIAGNOSIS — M25512 Pain in left shoulder: Secondary | ICD-10-CM | POA: Diagnosis not present

## 2019-08-19 DIAGNOSIS — I10 Essential (primary) hypertension: Secondary | ICD-10-CM | POA: Diagnosis not present

## 2019-08-30 DIAGNOSIS — I482 Chronic atrial fibrillation, unspecified: Secondary | ICD-10-CM | POA: Diagnosis not present

## 2019-08-30 DIAGNOSIS — Z7901 Long term (current) use of anticoagulants: Secondary | ICD-10-CM | POA: Diagnosis not present

## 2019-08-30 DIAGNOSIS — D6869 Other thrombophilia: Secondary | ICD-10-CM | POA: Diagnosis not present

## 2019-09-06 NOTE — Patient Instructions (Addendum)
DUE TO COVID-19 ONLY ONE VISITOR IS ALLOWED TO COME WITH YOU AND STAY IN THE WAITING ROOM ONLY DURING PRE OP AND PROCEDURE DAY OF SURGERY. THE 1 VISITOR MAY VISIT WITH YOU AFTER SURGERY IN YOUR PRIVATE ROOM DURING VISITING HOURS ONLY!  YOU NEED TO HAVE A COVID 19 TEST ON__10/12_____ @_10 :00____, THIS TEST MUST BE DONE BEFORE SURGERY, COME  [address], [place] , [address].  ([place]) ONCE YOUR COVID TEST IS COMPLETED, PLEASE BEGIN THE QUARANTINE INSTRUCTIONS AS OUTLINED IN YOUR HANDOUT.                William Sandoval    Your procedure is scheduled on: 09/16/19   Report to [facility] Main  Entrance   Report to admitting at  5:30 AM     Call this number if you have problems the morning of surgery [place], NO CHEWING GUM [place].   Do not eat food After Midnight.   YOU MAY HAVE CLEAR LIQUIDS FROM MIDNIGHT UNTIL 4:30AM. At 4:30AM   Please finish the prescribed Pre-Surgery  drink.   Nothing by mouth after you finish the drink !   Take these medicines the morning of surgery with A SIP OF WATER: Metoprolol, Levothyroxine,Zyrtec                                 You may not have any metal on your body including piercings             Do not wear jewelry,, lotions, powders or  deodorant        .              Men may shave face and neck.   Do not bring valuables to the hospital. [place].  Contacts, dentures or bridgework may not be worn into surgery.        Name and phone number of your driver:  Special Instructions: N/A              Please read over the following fact sheets you were given: _____________________________________________________________________             [facility] - Preparing for Surgery  Before surgery, you can play an important role.  Because skin is not sterile, your skin needs to be as free  of germs as possible.   You can reduce the number of germs on your skin by washing with CHG (chlorahexidine gluconate) soap before surgery.   CHG is an antiseptic cleaner which kills germs and bonds with the skin to continue killing germs even after washing. Please DO NOT use if you have an allergy to CHG or antibacterial soaps.   If your skin becomes reddened/irritated stop using the CHG and inform your nurse when you arrive at Short Stay.   You may shave your face/neck.  Please follow these instructions carefully:  1.  Shower with CHG Soap the night before surgery and the  morning of Surgery.  2.  If you choose to wash your hair, wash your hair first as usual with your  normal  shampoo.  3.  After you shampoo, rinse your hair and body thoroughly to remove the  shampoo.  4.  Use CHG as you would any other liquid soap.  You can apply chg directly  to the skin and wash                       Gently with a scrungie or clean washcloth.  5.  Apply the CHG Soap to your body ONLY FROM THE NECK DOWN.   Do not use on face/ open                           Wound or open sores. Avoid contact with eyes, ears mouth and genitals (private parts).                       Wash face,  Genitals (private parts) with your normal soap.             6.  Wash thoroughly, paying special attention to the area where your surgery  will be performed.  7.  Thoroughly rinse your body with warm water from the neck down.  8.  DO NOT shower/wash with your normal soap after using and rinsing off  the CHG Soap.             9.  Pat yourself dry with a clean towel.            10.  Wear clean pajamas.            11.  Place clean sheets on your bed the night of your first shower and do not  sleep with pets. Day of Surgery : Do not apply any lotions/deodorants the morning of surgery.  Please wear clean clothes to the hospital/surgery center.  FAILURE TO FOLLOW THESE INSTRUCTIONS MAY RESULT IN THE  CANCELLATION OF YOUR SURGERY PATIENT SIGNATURE_________________________________  NURSE SIGNATURE__________________________________  ________________________________________________________________________   William Sandoval  An incentive spirometer is a tool that can help keep your lungs clear and active. This tool measures how well you are filling your lungs with each breath. Taking long deep breaths may help reverse or decrease the chance of developing breathing (pulmonary) problems (especially infection) following:  A long period of time when you are unable to move or be active. BEFORE THE PROCEDURE   If the spirometer includes an indicator to show your best effort, your nurse or respiratory therapist will set it to a desired goal.  If possible, sit up straight or lean slightly forward. Try not to slouch.  Hold the incentive spirometer in an upright position. INSTRUCTIONS FOR USE  1. Sit on the edge of your bed if possible, or sit up as far as you can in bed or on a chair. 2. Hold the incentive spirometer in an upright position. 3. Breathe out normally. 4. Place the mouthpiece in your mouth and seal your lips tightly around it. 5. Breathe in slowly and as deeply as possible, raising the piston or the ball toward the top of the column. 6. Hold your breath for 3-5 seconds or for as long as possible. Allow the piston or ball to fall to the bottom of the column. 7. Remove the mouthpiece from your mouth and breathe out normally. 8. Rest for a few seconds and repeat Steps 1 through 7 at least 10 times every 1-2 hours when you are awake. Take your time and take a few normal breaths between deep breaths. 9. The spirometer may include an indicator to show your best effort.  Use the indicator as a goal to work toward during each repetition. 10. After each set of 10 deep breaths, practice coughing to be sure your lungs are clear. If you have an incision (the cut made at the time of surgery),  support your incision when coughing by placing a pillow or rolled up towels firmly against it. Once you are able to get out of bed, walk around indoors and cough well. You may stop using the incentive spirometer when instructed by your caregiver.  RISKS AND COMPLICATIONS  Take your time so you do not get dizzy or light-headed.  If you are in pain, you may need to take or ask for pain medication before doing incentive spirometry. It is harder to take a deep breath if you are having pain. AFTER USE  Rest and breathe slowly and easily.  It can be helpful to keep track of a log of your progress. Your caregiver can provide you with a simple table to help with this. If you are using the spirometer at home, follow these instructions: [place] IF:   You are having difficultly using the spirometer.  You have trouble using the spirometer as often as instructed.  Your pain medication is not giving enough relief while using the spirometer.  You develop fever of 100.5 F (38.1 C) or higher. SEEK IMMEDIATE MEDICAL CARE IF:   You cough up bloody sputum that had not been present before.  You develop fever of 102 F (38.9 C) or greater.  You develop worsening pain at or near the incision site. MAKE SURE YOU:   Understand these instructions.  Will watch your condition.  Will get help right away if you are not doing well or get worse. Document Released: 03/31/2007 Document Revised: 02/10/2012 Document Reviewed: 06/01/2007 [facility] Patient Information 2014 [place], [place].   ________________________________________________________________________

## 2019-09-07 ENCOUNTER — Encounter (HOSPITAL_COMMUNITY): Payer: Self-pay

## 2019-09-07 ENCOUNTER — Encounter (HOSPITAL_COMMUNITY)
Admission: RE | Admit: 2019-09-07 | Discharge: 2019-09-07 | Disposition: A | Discharge status: Home / Self Care | Payer: Medicare HMO | Source: Ambulatory Visit | Attending: Orthopedic Surgery | Admitting: Orthopedic Surgery

## 2019-09-07 ENCOUNTER — Other Ambulatory Visit: Payer: Self-pay

## 2019-09-07 DIAGNOSIS — Z01818 Encounter for other preprocedural examination: Secondary | ICD-10-CM | POA: Diagnosis not present

## 2019-09-07 HISTORY — DX: Cardiac arrhythmia, unspecified: I49.9

## 2019-09-07 HISTORY — DX: Unspecified osteoarthritis, unspecified site: M19.90

## 2019-09-07 LAB — CBC
HCT: 39.3 % (ref 39.0–52.0)
Hemoglobin: 13.1 g/dL (ref 13.0–17.0)
MCH: 35.1 pg — ABNORMAL HIGH (ref 26.0–34.0)
MCHC: 33.3 g/dL (ref 30.0–36.0)
MCV: 105.4 fL — ABNORMAL HIGH (ref 80.0–100.0)
Platelets: 241 10*3/uL (ref 150–400)
RBC: 3.73 MIL/uL — ABNORMAL LOW (ref 4.22–5.81)
RDW: 13.8 % (ref 11.5–15.5)
WBC: 7 10*3/uL (ref 4.0–10.5)
nRBC: 0 % (ref 0.0–0.2)

## 2019-09-07 LAB — SURGICAL PCR SCREEN
MRSA, PCR: NEGATIVE
Staphylococcus aureus: POSITIVE — AB

## 2019-09-07 LAB — BASIC METABOLIC PANEL
Anion gap: 5 (ref 5–15)
BUN: 16 mg/dL (ref 8–23)
CO2: 26 mmol/L (ref 22–32)
Calcium: 8.8 mg/dL — ABNORMAL LOW (ref 8.9–10.3)
Chloride: 107 mmol/L (ref 98–111)
Creatinine, Ser: 0.86 mg/dL (ref 0.61–1.24)
GFR calc Af Amer: >60 mL/min (ref >60)
GFR calc non Af Amer: >60 mL/min (ref >60)
Glucose, Bld: 89 mg/dL (ref 70–99)
Potassium: 4.4 mmol/L (ref 3.5–5.1)
Sodium: 138 mmol/L (ref 135–145)

## 2019-09-07 NOTE — Progress Notes (Signed)
PCP - Dr. Lenetta Quaker Cardiologist - Dr. Kari Baars  Chest x-ray - no EKG - 09/07/19 Stress Test - no ECHO - no Cardiac Cath - no  Sleep Study - no CPAP -   Fasting Blood Sugar -   NA Checks Blood Sugar _____ times a day  Blood Thinner Instructions:Coumadin. Pi will go to Dr's office 09/08/19 to discuss when to stop his coumadin and start Lovenox bridge. He was told to stop 5 days prior to 09/16/19 Aspirin Instructions: Last Dose:  Anesthesia review:   Patient denies shortness of breath, fever, cough and chest pain at PAT appointment  Yes  Patient verbalized understanding of instructions that were given to them at the PAT appointment. Patient was also instructed that they will need to review over the PAT instructions again at home before surgery. yes

## 2019-09-08 DIAGNOSIS — I10 Essential (primary) hypertension: Secondary | ICD-10-CM | POA: Diagnosis not present

## 2019-09-08 DIAGNOSIS — E039 Hypothyroidism, unspecified: Secondary | ICD-10-CM | POA: Diagnosis not present

## 2019-09-08 DIAGNOSIS — E782 Mixed hyperlipidemia: Secondary | ICD-10-CM | POA: Diagnosis not present

## 2019-09-13 ENCOUNTER — Other Ambulatory Visit (HOSPITAL_COMMUNITY)
Admission: RE | Admit: 2019-09-13 | Discharge: 2019-09-13 | Disposition: A | Discharge status: Home / Self Care | Payer: Medicare HMO | Source: Ambulatory Visit | Attending: Orthopedic Surgery | Admitting: Orthopedic Surgery

## 2019-09-13 DIAGNOSIS — M19112 Post-traumatic osteoarthritis, left shoulder: Secondary | ICD-10-CM | POA: Diagnosis not present

## 2019-09-13 DIAGNOSIS — I10 Essential (primary) hypertension: Secondary | ICD-10-CM | POA: Diagnosis present

## 2019-09-13 DIAGNOSIS — M75102 Unspecified rotator cuff tear or rupture of left shoulder, not specified as traumatic: Secondary | ICD-10-CM | POA: Diagnosis present

## 2019-09-13 DIAGNOSIS — I482 Chronic atrial fibrillation, unspecified: Secondary | ICD-10-CM | POA: Diagnosis present

## 2019-09-13 DIAGNOSIS — E039 Hypothyroidism, unspecified: Secondary | ICD-10-CM | POA: Diagnosis present

## 2019-09-13 DIAGNOSIS — E782 Mixed hyperlipidemia: Secondary | ICD-10-CM | POA: Diagnosis not present

## 2019-09-13 DIAGNOSIS — E785 Hyperlipidemia, unspecified: Secondary | ICD-10-CM | POA: Diagnosis present

## 2019-09-13 DIAGNOSIS — Z79899 Other long term (current) drug therapy: Secondary | ICD-10-CM | POA: Diagnosis not present

## 2019-09-13 DIAGNOSIS — Z20828 Contact with and (suspected) exposure to other viral communicable diseases: Secondary | ICD-10-CM | POA: Diagnosis present

## 2019-09-13 DIAGNOSIS — M25512 Pain in left shoulder: Secondary | ICD-10-CM | POA: Diagnosis not present

## 2019-09-13 DIAGNOSIS — G8918 Other acute postprocedural pain: Secondary | ICD-10-CM | POA: Diagnosis not present

## 2019-09-13 DIAGNOSIS — Z79890 Hormone replacement therapy (postmenopausal): Secondary | ICD-10-CM | POA: Diagnosis not present

## 2019-09-13 DIAGNOSIS — M12812 Other specific arthropathies, not elsewhere classified, left shoulder: Secondary | ICD-10-CM | POA: Diagnosis not present

## 2019-09-14 DIAGNOSIS — M25512 Pain in left shoulder: Secondary | ICD-10-CM | POA: Diagnosis not present

## 2019-09-14 DIAGNOSIS — E782 Mixed hyperlipidemia: Secondary | ICD-10-CM | POA: Diagnosis not present

## 2019-09-14 DIAGNOSIS — I482 Chronic atrial fibrillation, unspecified: Secondary | ICD-10-CM | POA: Diagnosis not present

## 2019-09-14 DIAGNOSIS — E039 Hypothyroidism, unspecified: Secondary | ICD-10-CM | POA: Diagnosis not present

## 2019-09-14 DIAGNOSIS — I10 Essential (primary) hypertension: Secondary | ICD-10-CM | POA: Diagnosis not present

## 2019-09-14 LAB — NOVEL CORONAVIRUS, NAA (HOSP ORDER, SEND-OUT TO REF LAB; TAT 18-24 HRS): SARS-CoV-2, NAA: NOT DETECTED

## 2019-09-15 NOTE — Anesthesia Preprocedure Evaluation (Addendum)
Anesthesia Evaluation  Patient identified by MRN, date of birth, ID band Patient awake    Reviewed: Allergy & Precautions, NPO status , Patient's Chart, lab work & pertinent test results  Airway Mallampati: I  TM Distance: >3 FB Neck ROM: Full    Dental  (+) Teeth Intact, Dental Advisory Given   Pulmonary neg pulmonary ROS,    Pulmonary exam normal breath sounds clear to auscultation       Cardiovascular hypertension, Pt. on home beta blockers (-) angina(-) CAD and (-) Past MI Normal cardiovascular exam+ dysrhythmias Atrial Fibrillation  Rhythm:Regular Rate:Normal     Neuro/Psych negative neurological ROS     GI/Hepatic negative GI ROS, Neg liver ROS,   Endo/Other  Hypothyroidism   Renal/GU negative Renal ROS     Musculoskeletal  (+) Arthritis , Osteoarthritis,  Left rotator cuff tear arthropathy   Abdominal   Peds  Hematology  (+) Blood dyscrasia (Warfarin), ,   Anesthesia Other Findings Day of surgery medications reviewed with the patient.  Reproductive/Obstetrics                            Anesthesia Physical Anesthesia Plan  ASA: III  Anesthesia Plan: General   Post-op Pain Management:  Regional for Post-op pain   Induction: Intravenous  PONV Risk Score and Plan: 2 and Dexamethasone and Ondansetron  Airway Management Planned: Oral ETT  Additional Equipment:   Intra-op Plan:   Post-operative Plan: Extubation in OR  Informed Consent: I have reviewed the patients History and Physical, chart, labs and discussed the procedure including the risks, benefits and alternatives for the proposed anesthesia with the patient or authorized representative who has indicated his/her understanding and acceptance.     Dental advisory given  Plan Discussed with: CRNA  Anesthesia Plan Comments:        Anesthesia Quick Evaluation

## 2019-09-16 ENCOUNTER — Inpatient Hospital Stay (HOSPITAL_COMMUNITY)
Admission: RE | Admit: 2019-09-16 | Discharge: 2019-09-17 | DRG: 483 | Disposition: A | Discharge status: Home / Self Care | Payer: Medicare HMO | Attending: Orthopedic Surgery | Admitting: Orthopedic Surgery

## 2019-09-16 ENCOUNTER — Encounter (HOSPITAL_COMMUNITY)
Admission: RE | Disposition: A | Discharge status: Home / Self Care | Payer: Self-pay | Source: Home / Self Care | Attending: Orthopedic Surgery

## 2019-09-16 ENCOUNTER — Inpatient Hospital Stay (HOSPITAL_COMMUNITY): Payer: Medicare HMO | Admitting: Anesthesiology

## 2019-09-16 ENCOUNTER — Other Ambulatory Visit: Payer: Self-pay

## 2019-09-16 ENCOUNTER — Inpatient Hospital Stay (HOSPITAL_COMMUNITY): Payer: Medicare HMO | Admitting: Physician Assistant

## 2019-09-16 ENCOUNTER — Encounter (HOSPITAL_COMMUNITY): Payer: Self-pay

## 2019-09-16 DIAGNOSIS — Z79899 Other long term (current) drug therapy: Secondary | ICD-10-CM | POA: Diagnosis not present

## 2019-09-16 DIAGNOSIS — E039 Hypothyroidism, unspecified: Secondary | ICD-10-CM | POA: Diagnosis present

## 2019-09-16 DIAGNOSIS — E785 Hyperlipidemia, unspecified: Secondary | ICD-10-CM | POA: Diagnosis present

## 2019-09-16 DIAGNOSIS — M75102 Unspecified rotator cuff tear or rupture of left shoulder, not specified as traumatic: Secondary | ICD-10-CM | POA: Diagnosis present

## 2019-09-16 DIAGNOSIS — I482 Chronic atrial fibrillation, unspecified: Secondary | ICD-10-CM | POA: Diagnosis present

## 2019-09-16 DIAGNOSIS — Z20828 Contact with and (suspected) exposure to other viral communicable diseases: Secondary | ICD-10-CM | POA: Diagnosis present

## 2019-09-16 DIAGNOSIS — Z79890 Hormone replacement therapy (postmenopausal): Secondary | ICD-10-CM | POA: Diagnosis not present

## 2019-09-16 DIAGNOSIS — I10 Essential (primary) hypertension: Secondary | ICD-10-CM | POA: Diagnosis present

## 2019-09-16 DIAGNOSIS — Z96612 Presence of left artificial shoulder joint: Secondary | ICD-10-CM

## 2019-09-16 HISTORY — PX: REVERSE SHOULDER ARTHROPLASTY: SHX5054

## 2019-09-16 LAB — CREATININE, SERUM
Creatinine, Ser: 0.7 mg/dL (ref 0.61–1.24)
GFR calc Af Amer: >60 mL/min (ref >60)
GFR calc non Af Amer: >60 mL/min (ref >60)

## 2019-09-16 LAB — CBC
HCT: 43.8 % (ref 39.0–52.0)
Hemoglobin: 14.9 g/dL (ref 13.0–17.0)
MCH: 35.4 pg — ABNORMAL HIGH (ref 26.0–34.0)
MCHC: 34 g/dL (ref 30.0–36.0)
MCV: 104 fL — ABNORMAL HIGH (ref 80.0–100.0)
Platelets: 219 10*3/uL (ref 150–400)
RBC: 4.21 MIL/uL — ABNORMAL LOW (ref 4.22–5.81)
RDW: 13.7 % (ref 11.5–15.5)
WBC: 16.1 10*3/uL — ABNORMAL HIGH (ref 4.0–10.5)
nRBC: 0 % (ref 0.0–0.2)

## 2019-09-16 LAB — PROTIME-INR
INR: 1.8 — ABNORMAL HIGH (ref 0.8–1.2)
Prothrombin Time: 20.2 seconds — ABNORMAL HIGH (ref 11.4–15.2)

## 2019-09-16 SURGERY — ARTHROPLASTY, SHOULDER, TOTAL, REVERSE
Anesthesia: General | Site: Shoulder | Laterality: Left

## 2019-09-16 MED ORDER — ACETAMINOPHEN 500 MG PO TABS
1000.0000 mg | ORAL_TABLET | Freq: Once | ORAL | Status: AC
  Administered 2019-09-16: 06:00:00 1000 mg via ORAL
  Filled 2019-09-16: qty 2

## 2019-09-16 MED ORDER — FENTANYL CITRATE (PF) 100 MCG/2ML IJ SOLN
INTRAMUSCULAR | Status: AC
  Filled 2019-09-16: qty 2

## 2019-09-16 MED ORDER — ONDANSETRON HCL 4 MG PO TABS: 4.0000 mg | ORAL_TABLET | Freq: Four times a day (QID) | ORAL | Status: DC | PRN

## 2019-09-16 MED ORDER — ONDANSETRON HCL 4 MG/2ML IJ SOLN: 4.0000 mg | Freq: Four times a day (QID) | INTRAMUSCULAR | Status: DC | PRN

## 2019-09-16 MED ORDER — LEVOTHYROXINE SODIUM 100 MCG PO TABS
100.0000 ug | ORAL_TABLET | Freq: Every day | ORAL | Status: DC
  Administered 2019-09-17: 06:00:00 100 ug via ORAL
  Filled 2019-09-16: qty 1

## 2019-09-16 MED ORDER — MENTHOL 3 MG MT LOZG: 1.0000 | LOZENGE | OROMUCOSAL | Status: DC | PRN

## 2019-09-16 MED ORDER — CHLORHEXIDINE GLUCONATE 4 % EX LIQD: 60.0000 mL | Freq: Once | CUTANEOUS | Status: DC

## 2019-09-16 MED ORDER — 0.9 % SODIUM CHLORIDE (POUR BTL) OPTIME
TOPICAL | Status: DC | PRN
  Administered 2019-09-16: 1000 mL

## 2019-09-16 MED ORDER — BISACODYL 5 MG PO TBEC: 5.0000 mg | DELAYED_RELEASE_TABLET | Freq: Every day | ORAL | Status: DC | PRN

## 2019-09-16 MED ORDER — FENTANYL CITRATE (PF) 100 MCG/2ML IJ SOLN
INTRAMUSCULAR | Status: DC | PRN
  Administered 2019-09-16 (×2): 50 ug via INTRAVENOUS

## 2019-09-16 MED ORDER — ROCURONIUM BROMIDE 10 MG/ML (PF) SYRINGE
PREFILLED_SYRINGE | INTRAVENOUS | Status: AC
  Filled 2019-09-16: qty 10

## 2019-09-16 MED ORDER — CEFAZOLIN SODIUM-DEXTROSE 2-4 GM/100ML-% IV SOLN
2.0000 g | INTRAVENOUS | Status: AC
  Administered 2019-09-16: 08:00:00 2 g via INTRAVENOUS
  Filled 2019-09-16: qty 100

## 2019-09-16 MED ORDER — HYDROMORPHONE HCL 1 MG/ML IJ SOLN: 0.5000 mg | INTRAMUSCULAR | Status: DC | PRN

## 2019-09-16 MED ORDER — MAGNESIUM CITRATE PO SOLN: 1.0000 | Freq: Once | ORAL | Status: DC | PRN

## 2019-09-16 MED ORDER — PROPOFOL 10 MG/ML IV BOLUS
INTRAVENOUS | Status: DC | PRN
  Administered 2019-09-16: 150 mg via INTRAVENOUS

## 2019-09-16 MED ORDER — TRANEXAMIC ACID-NACL 1000-0.7 MG/100ML-% IV SOLN
1000.0000 mg | INTRAVENOUS | Status: AC
  Administered 2019-09-16: 08:00:00 1000 mg via INTRAVENOUS
  Filled 2019-09-16: qty 100

## 2019-09-16 MED ORDER — ONDANSETRON HCL 4 MG/2ML IJ SOLN
INTRAMUSCULAR | Status: AC
  Filled 2019-09-16: qty 2

## 2019-09-16 MED ORDER — POLYETHYLENE GLYCOL 3350 17 G PO PACK: 17.0000 g | PACK | Freq: Every day | ORAL | Status: DC | PRN

## 2019-09-16 MED ORDER — OXYCODONE HCL 5 MG PO TABS: 5.0000 mg | ORAL_TABLET | ORAL | Status: DC | PRN

## 2019-09-16 MED ORDER — SUCCINYLCHOLINE CHLORIDE 200 MG/10ML IV SOSY
PREFILLED_SYRINGE | INTRAVENOUS | Status: AC
  Filled 2019-09-16: qty 10

## 2019-09-16 MED ORDER — DEXAMETHASONE SODIUM PHOSPHATE 10 MG/ML IJ SOLN
INTRAMUSCULAR | Status: AC
  Filled 2019-09-16: qty 1

## 2019-09-16 MED ORDER — LIDOCAINE 2% (20 MG/ML) 5 ML SYRINGE
INTRAMUSCULAR | Status: AC
  Filled 2019-09-16: qty 5

## 2019-09-16 MED ORDER — BUPIVACAINE LIPOSOME 1.3 % IJ SUSP
INTRAMUSCULAR | Status: DC | PRN
  Administered 2019-09-16: 10 mL via PERINEURAL

## 2019-09-16 MED ORDER — ENOXAPARIN SODIUM 100 MG/ML ~~LOC~~ SOLN
100.0000 mg | Freq: Once | SUBCUTANEOUS | Status: AC
  Administered 2019-09-17: 100 mg via SUBCUTANEOUS
  Filled 2019-09-16 (×2): qty 1

## 2019-09-16 MED ORDER — EPHEDRINE SULFATE-NACL 50-0.9 MG/10ML-% IV SOSY
PREFILLED_SYRINGE | INTRAVENOUS | Status: DC | PRN
  Administered 2019-09-16: 10 mg via INTRAVENOUS
  Administered 2019-09-16: 5 mg via INTRAVENOUS

## 2019-09-16 MED ORDER — LIDOCAINE 2% (20 MG/ML) 5 ML SYRINGE
INTRAMUSCULAR | Status: DC | PRN
  Administered 2019-09-16: 80 mg via INTRAVENOUS

## 2019-09-16 MED ORDER — OXYCODONE HCL 5 MG PO TABS: 10.0000 mg | ORAL_TABLET | ORAL | Status: DC | PRN

## 2019-09-16 MED ORDER — SUGAMMADEX SODIUM 200 MG/2ML IV SOLN
INTRAVENOUS | Status: DC | PRN
  Administered 2019-09-16: 200 mg via INTRAVENOUS

## 2019-09-16 MED ORDER — LACTATED RINGERS IV SOLN
INTRAVENOUS | Status: DC
  Administered 2019-09-16: 06:00:00 via INTRAVENOUS

## 2019-09-16 MED ORDER — EPHEDRINE 5 MG/ML INJ
INTRAVENOUS | Status: AC
  Filled 2019-09-16: qty 10

## 2019-09-16 MED ORDER — ROCURONIUM BROMIDE 50 MG/5ML IV SOSY
PREFILLED_SYRINGE | INTRAVENOUS | Status: DC | PRN
  Administered 2019-09-16: 50 mg via INTRAVENOUS

## 2019-09-16 MED ORDER — SUCCINYLCHOLINE CHLORIDE 20 MG/ML IJ SOLN
INTRAMUSCULAR | Status: DC | PRN
  Administered 2019-09-16: 120 mg via INTRAVENOUS

## 2019-09-16 MED ORDER — FENTANYL CITRATE (PF) 100 MCG/2ML IJ SOLN
25.0000 ug | INTRAMUSCULAR | Status: DC | PRN
  Administered 2019-09-16 (×2): 50 ug via INTRAVENOUS

## 2019-09-16 MED ORDER — METHOCARBAMOL 500 MG IVPB - SIMPLE MED
500.0000 mg | Freq: Four times a day (QID) | INTRAVENOUS | Status: DC | PRN
  Filled 2019-09-16: qty 50

## 2019-09-16 MED ORDER — DIPHENHYDRAMINE HCL 12.5 MG/5ML PO ELIX: 12.5000 mg | ORAL_SOLUTION | ORAL | Status: DC | PRN

## 2019-09-16 MED ORDER — DEXAMETHASONE SODIUM PHOSPHATE 4 MG/ML IJ SOLN
INTRAMUSCULAR | Status: DC | PRN
  Administered 2019-09-16: 6 mg via INTRAVENOUS

## 2019-09-16 MED ORDER — ENOXAPARIN SODIUM 40 MG/0.4ML ~~LOC~~ SOLN: 40.0000 mg | SUBCUTANEOUS | Status: DC

## 2019-09-16 MED ORDER — MIDAZOLAM HCL 2 MG/2ML IJ SOLN
INTRAMUSCULAR | Status: AC
  Filled 2019-09-16: qty 2

## 2019-09-16 MED ORDER — PHENOL 1.4 % MT LIQD
1.0000 | OROMUCOSAL | Status: DC | PRN
  Filled 2019-09-16: qty 177

## 2019-09-16 MED ORDER — BUPIVACAINE HCL (PF) 0.5 % IJ SOLN
INTRAMUSCULAR | Status: DC | PRN
  Administered 2019-09-16: 10 mL via PERINEURAL

## 2019-09-16 MED ORDER — ONDANSETRON HCL 4 MG/2ML IJ SOLN
INTRAMUSCULAR | Status: DC | PRN
  Administered 2019-09-16: 4 mg via INTRAVENOUS

## 2019-09-16 MED ORDER — LACTATED RINGERS IV SOLN
INTRAVENOUS | Status: DC
  Administered 2019-09-16: 17:00:00 via INTRAVENOUS

## 2019-09-16 MED ORDER — STERILE WATER FOR IRRIGATION IR SOLN
Status: DC | PRN
  Administered 2019-09-16: 2000 mL

## 2019-09-16 MED ORDER — WARFARIN SODIUM 5 MG PO TABS: 5.0000 mg | ORAL_TABLET | ORAL | Status: DC

## 2019-09-16 MED ORDER — ALUM & MAG HYDROXIDE-SIMETH 200-200-20 MG/5ML PO SUSP: 30.0000 mL | ORAL | Status: DC | PRN

## 2019-09-16 MED ORDER — ACETAMINOPHEN 325 MG PO TABS: 325.0000 mg | ORAL_TABLET | Freq: Four times a day (QID) | ORAL | Status: DC | PRN

## 2019-09-16 MED ORDER — METOCLOPRAMIDE HCL 5 MG PO TABS: 5.0000 mg | ORAL_TABLET | Freq: Three times a day (TID) | ORAL | Status: DC | PRN

## 2019-09-16 MED ORDER — METOPROLOL TARTRATE 50 MG PO TABS
50.0000 mg | ORAL_TABLET | Freq: Two times a day (BID) | ORAL | Status: DC
  Administered 2019-09-16 – 2019-09-17 (×2): 50 mg via ORAL
  Filled 2019-09-16 (×2): qty 1

## 2019-09-16 MED ORDER — PANTOPRAZOLE SODIUM 40 MG PO TBEC
40.0000 mg | DELAYED_RELEASE_TABLET | Freq: Every day | ORAL | Status: DC
  Administered 2019-09-16 – 2019-09-17 (×2): 40 mg via ORAL
  Filled 2019-09-16 (×2): qty 1

## 2019-09-16 MED ORDER — DOCUSATE SODIUM 100 MG PO CAPS
100.0000 mg | ORAL_CAPSULE | Freq: Two times a day (BID) | ORAL | Status: DC
  Administered 2019-09-16 – 2019-09-17 (×2): 100 mg via ORAL
  Filled 2019-09-16 (×2): qty 1

## 2019-09-16 MED ORDER — METOCLOPRAMIDE HCL 5 MG/ML IJ SOLN: 5.0000 mg | Freq: Three times a day (TID) | INTRAMUSCULAR | Status: DC | PRN

## 2019-09-16 MED ORDER — METHOCARBAMOL 500 MG PO TABS: 500.0000 mg | ORAL_TABLET | Freq: Four times a day (QID) | ORAL | Status: DC | PRN

## 2019-09-16 MED ORDER — ONDANSETRON HCL 4 MG/2ML IJ SOLN: 4.0000 mg | Freq: Once | INTRAMUSCULAR | Status: DC | PRN

## 2019-09-16 SURGICAL SUPPLY — 77 items
ADH SKN CLS APL DERMABOND .7 (GAUZE/BANDAGES/DRESSINGS) ×1
AID PSTN UNV HD RSTRNT DISP (MISCELLANEOUS) ×1
BAG SPEC THK2 15X12 ZIP CLS (MISCELLANEOUS) ×1
BAG ZIPLOCK 12X15 (MISCELLANEOUS) ×3 IMPLANT
BLADE SAW SGTL 83.5X18.5 (BLADE) ×3 IMPLANT
BSPLAT GLND +2X24 MDLR (Joint) ×1 IMPLANT
COOLER ICEMAN CLASSIC (MISCELLANEOUS) IMPLANT
COVER BACK TABLE 60X90IN (DRAPES) ×3 IMPLANT
COVER SURGICAL LIGHT HANDLE (MISCELLANEOUS) ×3 IMPLANT
COVER WAND RF STERILE (DRAPES) IMPLANT
CUP SUT UNIV REVERS 42 NEUT (Shoulder) ×2 IMPLANT
DERMABOND ADVANCED (GAUZE/BANDAGES/DRESSINGS) ×2
DERMABOND ADVANCED .7 DNX12 (GAUZE/BANDAGES/DRESSINGS) ×1 IMPLANT
DRAPE INCISE IOBAN 66X45 STRL (DRAPES) IMPLANT
DRAPE ORTHO SPLIT 77X108 STRL (DRAPES) ×6
DRAPE SHEET LG 3/4 BI-LAMINATE (DRAPES) ×3 IMPLANT
DRAPE SURG 17X11 SM STRL (DRAPES) ×3 IMPLANT
DRAPE SURG ORHT 6 SPLT 77X108 (DRAPES) ×2 IMPLANT
DRAPE U-SHAPE 47X51 STRL (DRAPES) ×3 IMPLANT
DRESSING AQUACEL AG SP 3.5X10 (GAUZE/BANDAGES/DRESSINGS) IMPLANT
DRSG AQUACEL AG ADV 3.5X10 (GAUZE/BANDAGES/DRESSINGS) ×3 IMPLANT
DRSG AQUACEL AG SP 3.5X10 (GAUZE/BANDAGES/DRESSINGS) ×3
DURAPREP 26ML APPLICATOR (WOUND CARE) ×3 IMPLANT
ELECT BLADE TIP CTD 4 INCH (ELECTRODE) ×3 IMPLANT
ELECT REM PT RETURN 15FT ADLT (MISCELLANEOUS) ×3 IMPLANT
FACESHIELD WRAPAROUND (MASK) ×12 IMPLANT
FACESHIELD WRAPAROUND OR TEAM (MASK) ×4 IMPLANT
GLENOID SYS 42 +4 LAT/24 SHLDR (Miscellaneous) ×2 IMPLANT
GLENOID UNI REV MOD 24 +2 LAT (Joint) ×2 IMPLANT
GLOVE BIO SURGEON STRL SZ7.5 (GLOVE) ×3 IMPLANT
GLOVE BIO SURGEON STRL SZ8 (GLOVE) ×3 IMPLANT
GLOVE SS BIOGEL STRL SZ 7 (GLOVE) ×1 IMPLANT
GLOVE SS BIOGEL STRL SZ 7.5 (GLOVE) ×1 IMPLANT
GLOVE SUPERSENSE BIOGEL SZ 7 (GLOVE) ×2
GLOVE SUPERSENSE BIOGEL SZ 7.5 (GLOVE) ×2
GLOVE SURG SYN 7.0 (GLOVE) IMPLANT
GLOVE SURG SYN 7.0 PF PI (GLOVE) IMPLANT
GLOVE SURG SYN 7.5  E (GLOVE)
GLOVE SURG SYN 7.5 E (GLOVE) IMPLANT
GLOVE SURG SYN 7.5 PF PI (GLOVE) IMPLANT
GLOVE SURG SYN 8.0 (GLOVE) IMPLANT
GLOVE SURG SYN 8.0 PF PI (GLOVE) IMPLANT
GOWN STRL REUS W/TWL LRG LVL3 (GOWN DISPOSABLE) ×6 IMPLANT
INSERT HUMERAL L/42+3 IN 42CUP (Shoulder) ×2 IMPLANT
KIT BASIN OR (CUSTOM PROCEDURE TRAY) ×3 IMPLANT
KIT TURNOVER KIT A (KITS) IMPLANT
MANIFOLD NEPTUNE II (INSTRUMENTS) ×3 IMPLANT
NDL TAPERED W/ NITINOL LOOP (MISCELLANEOUS) ×1 IMPLANT
NEEDLE TAPERED W/ NITINOL LOOP (MISCELLANEOUS) ×3 IMPLANT
NS IRRIG 1000ML POUR BTL (IV SOLUTION) ×3 IMPLANT
PACK SHOULDER (CUSTOM PROCEDURE TRAY) ×3 IMPLANT
PAD ARMBOARD 7.5X6 YLW CONV (MISCELLANEOUS) ×3 IMPLANT
PAD COLD SHLDR WRAP-ON (PAD) IMPLANT
PIN SET MODULAR GLENOID SYSTEM (PIN) ×2 IMPLANT
PLATE 135X6 HOLE (Plate) ×2 IMPLANT
RESTRAINT HEAD UNIVERSAL NS (MISCELLANEOUS) ×3 IMPLANT
SCREW CENTRAL MODULAR 25 (Screw) ×2 IMPLANT
SCREW PERI LOCK 5.5X16 (Screw) ×4 IMPLANT
SCREW PERI LOCK 5.5X24 (Screw) ×2 IMPLANT
SCREW PERIPHERAL 5.5X28 LOCK (Screw) ×2 IMPLANT
SLING ARM FOAM STRAP LRG (SOFTGOODS) ×2 IMPLANT
SLING ARM FOAM STRAP MED (SOFTGOODS) IMPLANT
SPONGE LAP 18X18 RF (DISPOSABLE) IMPLANT
STEM CAP COATED SZ 12 SHOULDER (Miscellaneous) ×2 IMPLANT
SUCTION FRAZIER HANDLE 12FR (TUBING) ×2
SUCTION TUBE FRAZIER 12FR DISP (TUBING) ×1 IMPLANT
SUT FIBERWIRE #2 38 T-5 BLUE (SUTURE)
SUT MNCRL AB 3-0 PS2 18 (SUTURE) ×3 IMPLANT
SUT MON AB 2-0 CT1 36 (SUTURE) ×3 IMPLANT
SUT VIC AB 1 CT1 36 (SUTURE) ×3 IMPLANT
SUTURE FIBERWR #2 38 T-5 BLUE (SUTURE) IMPLANT
SUTURE TAPE 1.3 40 TPR END (SUTURE) ×2 IMPLANT
SUTURETAPE 1.3 40 TPR END (SUTURE) ×6
TOWEL OR 17X26 10 PK STRL BLUE (TOWEL DISPOSABLE) ×3 IMPLANT
TOWEL OR NON WOVEN STRL DISP B (DISPOSABLE) ×3 IMPLANT
WATER STERILE IRR 1000ML POUR (IV SOLUTION) ×6 IMPLANT
YANKAUER SUCT BULB TIP 10FT TU (MISCELLANEOUS) ×3 IMPLANT

## 2019-09-16 NOTE — Transfer of Care (Signed)
Immediate Anesthesia Transfer of Care Note  Patient: William Sandoval  Procedure(s) Performed: REVERSE SHOULDER ARTHROPLASTY (Left Shoulder)  Patient Location: PACU  Anesthesia Type:General and Regional  Level of Consciousness: awake and alert   Airway & Oxygen Therapy: Patient Spontanous Breathing and Patient connected to face mask oxygen  Post-op Assessment: Report given to RN and Post -op Vital signs reviewed and stable  Post vital signs: Reviewed and stable  Last Vitals:  Vitals Value Taken Time  BP 159/87 09/16/19 0911  Temp    Pulse 58 09/16/19 0912  Resp 17 09/16/19 0912  SpO2 100 % 09/16/19 0912  Vitals shown include unvalidated device data.  Last Pain:  Vitals:   09/16/19 0558  TempSrc: Oral  PainSc:          Complications: No apparent anesthesia complications

## 2019-09-16 NOTE — Plan of Care (Signed)
Continue current POC 

## 2019-09-16 NOTE — Anesthesia Postprocedure Evaluation (Signed)
Anesthesia Post Note  Patient: PETTER SWARD  Procedure(s) Performed: REVERSE SHOULDER ARTHROPLASTY (Left Shoulder)     Patient location during evaluation: PACU Anesthesia Type: General Level of consciousness: awake and alert Pain management: pain level controlled Vital Signs Assessment: post-procedure vital signs reviewed and stable Respiratory status: spontaneous breathing, nonlabored ventilation and respiratory function stable Cardiovascular status: blood pressure returned to baseline and stable Postop Assessment: no apparent nausea or vomiting Anesthetic complications: no    Last Vitals:  Vitals:   09/16/19 1402 09/16/19 1517  BP: 128/90 129/86  Pulse: 67 75  Resp: 17   Temp:  36.6 C  SpO2: 100% 100%    Last Pain:  Vitals:   09/16/19 1517  TempSrc: Oral  PainSc:                  Catalina Gravel

## 2019-09-16 NOTE — Anesthesia Procedure Notes (Signed)
Procedure Name: Intubation Date/Time: 09/16/2019 7:36 AM Performed by: Lieutenant Diego, CRNA Pre-anesthesia Checklist: Patient identified, Emergency Drugs available, Suction available and Patient being monitored Patient Re-evaluated:Patient Re-evaluated prior to induction Oxygen Delivery Method: Circle system utilized Preoxygenation: Pre-oxygenation with 100% oxygen Induction Type: IV induction Ventilation: Mask ventilation without difficulty Laryngoscope Size: Miller and 2 Grade View: Grade I Tube type: Oral Tube size: 8.0 mm Number of attempts: 1 Airway Equipment and Method: Stylet Placement Confirmation: ETT inserted through vocal cords under direct vision,  positive ETCO2 and breath sounds checked- equal and bilateral Secured at: 25 cm Tube secured with: Tape Dental Injury: Teeth and Oropharynx as per pre-operative assessment

## 2019-09-16 NOTE — H&P (Signed)
William Sandoval    Chief Complaint: Left rotator cuff tear arthropathy HPI: The patient is a 73 y.o. male with and stage left shoulder rotator cuff tear arthropathy  Past Medical History:  Diagnosis Date  . Arthritis    shoulder,  . Chronic atrial fibrillation ([place])   . Dyslipidemia   . Dysrhythmia    AFib  . Epistaxis   . Hypertension     Past Surgical History:  Procedure Laterality Date  . SHOULDER SURGERY     Right    Family History  Problem Relation Age of Onset  . Heart attack Mother     Social History:  reports that he has never smoked. He has never used smokeless tobacco. He reports that he does not drink alcohol or use drugs.   Medications Prior to Admission  Medication Sig Dispense Refill  . levothyroxine (SYNTHROID) 100 MCG tablet Take 100 mcg by mouth daily before breakfast.    . metoprolol (LOPRESSOR) 50 MG tablet Take 50 mg by mouth 2 (two) times daily.    . simvastatin (ZOCOR) 20 MG tablet Take 20 mg by mouth daily.     Marland Kitchen warfarin (COUMADIN) 5 MG tablet Take 5-7.5 mg by mouth See admin instructions. Take 1.5 tablets (7.5 mg) on Mondays & Fridays, then take 1 tablet (5 mg) by mouth on all other days.    Marland Kitchen acetaminophen (TYLENOL) 650 MG CR tablet Take 650-1,300 mg by mouth every 8 (eight) hours as needed for pain.    . Cetirizine HCl (ZYRTEC ALLERGY) 10 MG CAPS Take 10 mg by mouth daily as needed (allergies.).        Physical Exam: Left shoulder demonstrates painful and restricted motion as noted at recent office visits.  Recent imaging confirms advanced arthritis with rotator cuff dysfunction.  Vitals  Temp:  [98.9 F (37.2 C)] 98.9 F (37.2 C) (10/15 0558) Pulse Rate:  [64] 64 (10/15 0558) Resp:  [18] 18 (10/15 0558) BP: (167)/(84) 167/84 (10/15 0558) SpO2:  [100 %] 100 % (10/15 0558) Weight:  [99.9 kg] 99.9 kg (10/15 0536)  Assessment/Plan  Impression: Left rotator cuff tear arthropathy  Plan of Action: Procedure(s): REVERSE SHOULDER  ARTHROPLASTY  William Sandoval William Sandoval 09/16/2019, 6:02 AM Contact # [phone]

## 2019-09-16 NOTE — Progress Notes (Signed)
[place] for Warfarin Indication: atrial fibrillation  Allergies  Allergen Reactions  . Gluten Meal Other (See Comments)    Unknown   . Shrimp [Shellfish Allergy] Other (See Comments)    Unknown   . Sertraline Rash   Patient Measurements: Height: 6\' 2"  (188 cm) Weight: 220 lb 3.2 oz (99.9 kg) IBW/kg (Calculated) : 82.2  Vital Signs: Temp: 97.6 F (36.4 C) (10/15 1242) Temp Source: Oral (10/15 1242) BP: 141/91 (10/15 1242) Pulse Rate: 65 (10/15 1242)  Labs: No results for input(s): HGB, HCT, PLT, APTT, LABPROT, INR, HEPARINUNFRC, HEPRLOWMOCWT, CREATININE, CKTOTAL, CKMB, TROPONINIHS in the last 72 hours.  Estimated Creatinine Clearance: 99.5 mL/min (by C-G formula based on SCr of 0.86 mg/dL).  Medical History: Past Medical History:  Diagnosis Date  . Arthritis    shoulder,  . Chronic atrial fibrillation ([place])   . Dyslipidemia   . Dysrhythmia    AFib  . Epistaxis   . Hypertension    Medications:  Scheduled:  . docusate sodium  100 mg Oral BID  . [START ON 09/17/2019] enoxaparin (LOVENOX) injection  40 mg Subcutaneous Q24H  . [START ON 09/17/2019] levothyroxine  100 mcg Oral QAC breakfast  . metoprolol tartrate  50 mg Oral BID  . pantoprazole  40 mg Oral Daily   Assessment: 86 yoM s/p L rotator cuff repair. Hx of Afib on Warfarin 7.5mg  MF and 5mg  other days, last dose 10/12. Patient forget to hold Coumadin as ordered, no bridging prior to surgery per PA.  Appointments at [facility] noted, unable to see any information, previous INR results. - ordered baseline INR, d/w ortho PA, per PCP request: give one dose therapeutic Lovenox in am 10/16 = 100mg  and PCP will dose Warfarin thereafter. No Warfarin dose 10/15  Goal of Therapy:  INR 2-3 Monitor platelets by anticoagulation protocol: Yes   Plan:   Baseline Protime/INR today post-op = 1.8  Daily PT/INR ordered from 10/16  Plan therapeutic Lovenox dose x1 POD1 =  100mg   Anticipate discharge 10/16  Minda Ditto PharmD 09/16/2019,12:58 PM

## 2019-09-16 NOTE — Anesthesia Procedure Notes (Signed)
Anesthesia Regional Block: Interscalene brachial plexus block   Pre-Anesthetic Checklist: ,, timeout performed, Correct Patient, Correct Site, Correct Laterality, Correct Procedure, Correct Position, site marked, Risks and benefits discussed,  Surgical consent,  Pre-op evaluation,  At surgeon's request and post-op pain management  Laterality: Left  Prep: chloraprep       Needles:  Injection technique: Single-shot  Needle Type: Echogenic Stimulator Needle     Needle Length: 5cm  Needle Gauge: 22     Additional Needles:   Procedures:,,,, ultrasound used (permanent image in chart),,,,  Narrative:  Start time: 09/16/2019 7:00 AM End time: 09/16/2019 7:07 AM Injection made incrementally with aspirations every 5 mL.  Performed by: Personally  Anesthesiologist: Catalina Gravel, MD  Additional Notes: Functioning IV was confirmed and monitors were applied.  A 81mm 22ga Arrow echogenic stimulator needle was used. Sterile prep and drape, hand hygiene, and sterile gloves were used.  Negative aspiration and negative test dose prior to incremental administration of local anesthetic. The patient tolerated the procedure well.  Ultrasound guidance: relevent anatomy identified, needle position confirmed, local anesthetic spread visualized around nerve(s), vascular puncture avoided.  Image printed for medical record.

## 2019-09-16 NOTE — Op Note (Signed)
09/16/2019  9:27 AM  PATIENT:   William Sandoval  73 y.o. male  PRE-OPERATIVE DIAGNOSIS:  Left rotator cuff tear arthropathy  POST-OPERATIVE DIAGNOSIS: Same  PROCEDURE: Left shoulder reverse arthroplasty utilizing a press-fit size 12 Arthrex stem with a +9 spacer, +3 polyethylene insert, 42/+4 glenosphere on a small/+2 baseplate  SURGEON:  Meggie Laseter, Metta Clines M.D.  ASSISTANTS: Merla Riches, PA-C  ANESTHESIA:   General endotracheal and interscalene block with Exparel  EBL: 150 cc  SPECIMEN: None  Drains: None   PATIENT DISPOSITION:  PACU - hemodynamically stable.    PLAN OF CARE: Admit for overnight observation  Brief history:  Patient is a 73 year old male who has had chronic and progressively increasing left shoulder pain now with severe functional rotations and impact on ADLs related to end stage left shoulder rotator cuff tear arthropathy.  He is brought to the operating room this time for planned left shoulder reverse arthroplasty  Preoperatively had counseled Mr. Arrona regarding treatment options as well as the potential risks versus benefits thereof.  Possible surgical complications were reviewed including bleeding, infection, neurovascular injury, persistent pain, loss of motion, failure of the implant, anesthetic complication, and possible need for revision surgery.  He understands, and accepts, and agrees with our planned procedure.  Procedure in detail:  After undergoing routine preop evaluation patient received prophylactic and biotics and interscalene block with Exparel was established in the holding area by the anesthesia department.  Placed supine on the operating table and underwent the smooth induction of a general endotracheal anesthesia.  Placed into the beachchair position and appropriately padded and protected.  Left shoulder girdle region was sterilely prepped and draped in standard fashion.  Timeout was called.  An anterior deltopectoral approach to the left  shoulder was made through an 10 cm incision.  Skin flaps are elevated dissection carried deeply to the deltopectoral interval with a vein taken laterally upper centimeter and half pectoralis major was tenotomized for exposure conjoined tendon mobilized retracted medially.  Long head biceps tendon was tenodesed at the upper border the pectoralis major and the proximal segment was unroofed and excised.  Subscapularis was then divided from the lesser tuberosity in a subperiosteal fashion and tagged with a pair of suture tape sutures and then capsular attachments divided from the anterior infra margins interval neck and in her blood was limited to the wound.  The extra medullary guide was then used to outline the proposed humeral head resection which was performed with an oscillating saw at approximately 20 degrees of retroversion and a metal cap placed over the cut proximal humeral surface which was sized as a large.  We then exposed the glenoid with a combination of retractors giving excellent visualization and a circumferential labral resection was performed gaining complete visualization of the periphery of the glenoid.  A guidepin was then directed into the center of the glenoid with an approximately 10 degree inferior tilt and the glenoid was then prepared with our central followed by the peripheral reamer and the central hole was prepared with the tap and our final glenoid baseplate with a [id number] screw was then inserted with excellent purchase.  The peripheral locking screws were all then placed.  A 42/+4 glenosphere was then impacted on the baseplate and the central locking screw was applied with excellent fixation.  At this point we then returned our attention to the proximal humerus where hand reaming was performed and we broached up to size 12 in the metaphysis was then reamed with  the metaphyseal reamer and a trial reduction was then performed which shows good soft tissue balance.  Our final implant was  then assembled on the back table and was then impacted into the humeral canal obtaining excellent fit and fixation.  We then performed a series of trial reductions and ultimately felt the a total of +12 off the implant gave [place] excellent soft tissue balance.  We then applied a +9 spacer and a +3 poly-and performed a final reduction which gave excellent soft tissue balance good stability good motion.  The wound was copiously irrigated.  Hemostasis was obtained.  The subscapularis showed good mobility and was then repaired back to the collar of her implant using the previously placed suture tapes.  The deltopectoral interval was then repaired with a series of figure-of-eight number Vicryl sutures.  2-0 Vicryl used for subcu layer and intracuticular 3 Monocryl for the skin followed by Dermabond and Aquasol dressing and left arm was then placed into a sling, cooling unit applied to the shoulder and patient was awakened, extubated, and taken to the recovery room in stable condition.  Merla Riches, PA-C was used as an Environmental consultant throughout this case essential for help with positioning the patient, positioning extremity, tissue manipulation, implantation of the prosthesis, wound closure, and intraoperative decision making.  Metta Clines Cannen Dupras MD   Contact # [phone]

## 2019-09-16 NOTE — Discharge Instructions (Signed)
° °Kevin M. Supple, M.D., F.A.A.O.S. °Orthopaedic Surgery °Specializing in Arthroscopic and Reconstructive °Surgery of the Shoulder °[phone] °[address] - [place], [place] [address] - Fax [phone] ° ° °POST-OP TOTAL SHOULDER REPLACEMENT INSTRUCTIONS ° °1. Call the office at [phone] to schedule your first post-op appointment 10-14 days from the date of your surgery. ° °2. The bandage over your incision is waterproof. You may begin showering with this dressing on. You may leave this dressing on until first follow up appointment within 2 weeks. We prefer you leave this dressing in place until follow up however after 5-7 days if you are having itching or skin irritation and would like to remove it you may do so. Go slow and tug at the borders gently to break the bond the dressing has with the skin. At this point if there is no drainage it is okay to go without a bandage or you may cover it with a light guaze and tape. You can also expect significant bruising around your shoulder that will drift down your arm and into your chest wall. This is very normal and should resolve over several days. ° ° 3. Wear your sling/immobilizer at all times except to perform the exercises below or to occasionally let your arm dangle by your side to stretch your elbow. You also need to sleep in your sling immobilizer until instructed otherwise. It is ok to remove your sling if you are sitting in a controlled environment and allow your arm to rest in a position of comfort by your side or on your lap with pillows to give your neck and skin a break from the sling. You may remove it to allow arm to dangle by side to shower. If you are up walking around and when you go to sleep at night you need to wear it. ° °4. Range of motion to your elbow, wrist, and hand are encouraged 3-5 times daily. Exercise to your hand and fingers helps to reduce swelling you may experience. ° °5. Utilize ice to the shoulder 3-5 times  minimum a day and additionally if you are experiencing pain. ° °6. Prescriptions for a pain medication and a muscle relaxant are provided for you. It is recommended that if you are experiencing pain that you pain medication alone is not controlling, add the muscle relaxant along with the pain medication which can give additional pain relief. The first 1-2 days is generally the most severe of your pain and then should gradually decrease. As your pain lessens it is recommended that you decrease your use of the pain medications to an "as needed basis'" only and to always comply with the recommended dosages of the pain medications. ° °7. Pain medications can produce constipation along with their use. If you experience this, the use of an over the counter stool softener or laxative daily is recommended.  ° °8. For additional questions or concerns, please do not hesitate to call the office. If after hours there is an answering service to forward your concerns to the physician on call. ° °9.Pain control following an exparel block ° °To help control your post-operative pain you received a nerve block  performed with Exparel which is a long acting anesthetic (numbing agent) which can provide pain relief and sensations of numbness (and relief of pain) in the operative shoulder and arm for up to 3 days. Sometimes it provides mixed relief, meaning you may still have numbness in certain areas of the arm but can still   be able to move  parts of that arm, hand, and fingers. We recommend that your prescribed pain medications  be used as needed. We do not feel it is necessary to "pre medicate" and "stay ahead" of pain.  Taking narcotic pain medications when you are not having any pain can lead to unnecessary and potentially dangerous side effects.    10. Use the ice machine as much as possible in the first 5-7 days from surgery, then you can wean its use to as needed. The ice typically needs to be replaced every 6 hours, instead of  ice you can actually freeze water bottles to put in the cooler and then fill water around them to avoid having to purchase ice. You can have spare water bottles freezing to allow you to rotate them once they have melted. Try to have a thin shirt or light cloth or towel under the ice wrap to protect your skin.   11.  We recommend that you avoid any dental work or cleaning in the first 3 months following your joint replacement. This is to help minimize the possibility of infection from the bacteria in your mouth that enters your bloodstream during dental work. We also recommend that you take an antibiotic prior to your dental work for the first year after your shoulder replacement to further help reduce that risk. Please simply contact our office for antibiotics to be sent to your pharmacy prior to dental work.  POST-OP EXERCISES  Pendulum Exercises  Perform pendulum exercises while standing and bending at the waist. Support your uninvolved arm on a table or chair and allow your operated arm to hang freely. Make sure to do these exercises passively - not using you shoulder muscles. These exercises can be performed once your nerve block effects have worn off.  Repeat 20 times. Do 3 sessions per day.

## 2019-09-17 ENCOUNTER — Encounter (HOSPITAL_COMMUNITY): Payer: Self-pay | Admitting: Orthopedic Surgery

## 2019-09-17 DIAGNOSIS — E039 Hypothyroidism, unspecified: Secondary | ICD-10-CM | POA: Diagnosis not present

## 2019-09-17 DIAGNOSIS — I482 Chronic atrial fibrillation, unspecified: Secondary | ICD-10-CM | POA: Diagnosis not present

## 2019-09-17 DIAGNOSIS — M25512 Pain in left shoulder: Secondary | ICD-10-CM | POA: Diagnosis not present

## 2019-09-17 DIAGNOSIS — I10 Essential (primary) hypertension: Secondary | ICD-10-CM | POA: Diagnosis not present

## 2019-09-17 DIAGNOSIS — E782 Mixed hyperlipidemia: Secondary | ICD-10-CM | POA: Diagnosis not present

## 2019-09-17 LAB — PROTIME-INR
INR: 1.8 — ABNORMAL HIGH (ref 0.8–1.2)
Prothrombin Time: 20.6 seconds — ABNORMAL HIGH (ref 11.4–15.2)

## 2019-09-17 MED ORDER — OXYCODONE-ACETAMINOPHEN 5-325 MG PO TABS: 1.0000 | ORAL_TABLET | ORAL | 0 refills | Status: DC | PRN

## 2019-09-17 NOTE — Discharge Summary (Signed)
PATIENT ID:      William Sandoval  MRN:     [id number] DOB/AGE:    Mar 08, 1946 / 73 y.o.     DISCHARGE SUMMARY  ADMISSION DATE:    09/16/2019 DISCHARGE DATE:    ADMISSION DIAGNOSIS: Left rotator cuff tear arthropathy Past Medical History:  Diagnosis Date  . Arthritis    shoulder,  . Chronic atrial fibrillation ([place])   . Dyslipidemia   . Dysrhythmia    AFib  . Epistaxis   . Hypertension     DISCHARGE DIAGNOSIS:   Active Problems:   S/P reverse total shoulder arthroplasty, left   PROCEDURE: Procedure(s): REVERSE SHOULDER ARTHROPLASTY on 09/16/2019  CONSULTS:    HISTORY:  See H&P in chart.  HOSPITAL COURSE:  William Sandoval is a 73 y.o. admitted on 09/16/2019 with a diagnosis of Left rotator cuff tear arthropathy.  They were brought to the operating room on 09/16/2019 and underwent Procedure(s): [name].    They were given perioperative antibiotics:  Anti-infectives (From admission, onward)   Start     Dose/Rate Route Frequency Ordered Stop   09/16/19 0600  ceFAZolin (ANCEF) IVPB 2g/100 mL premix     2 g 200 mL/hr over 30 Minutes Intravenous On call to O.R. 09/16/19 [id number] 09/16/19 0752    .  Patient underwent the above named procedure and tolerated it well. The following day they were hemodynamically stable and pain was controlled on oral analgesics. They were neurovascularly intact to the operative extremity. OT was ordered and worked with patient per protocol. They were medically and orthopaedically stable for discharge on .    DIAGNOSTIC STUDIES:  RECENT RADIOGRAPHIC STUDIES :  No results found.  RECENT VITAL SIGNS:   Patient Vitals for the past 24 hrs:  BP Temp Temp src Pulse Resp SpO2  09/17/19 0819 (!) 150/85 - - 67 14 100 %  09/17/19 0440 (!) 127/98 97.6 F (36.4 C) Oral (!) 54 17 100 %  09/17/19 0147 120/77 98.6 F (37 C) Oral 63 16 100 %  09/16/19 2035 134/77 (!) 97.5 F (36.4 C) Oral 63 18 100 %  09/16/19 1753 119/84 98 F (36.7 C) -  73 17 100 %  09/16/19 1517 129/86 97.8 F (36.6 C) Oral 75 - 100 %  09/16/19 1402 128/90 - - 67 17 100 %  09/16/19 1242 (!) 141/91 97.6 F (36.4 C) Oral 65 17 100 %  09/16/19 1215 124/80 97.6 F (36.4 C) - 71 19 100 %  09/16/19 1145 (!) 128/103 - - (!) 56 12 100 %  09/16/19 1045 (!) 147/85 - - (!) 51 15 100 %  09/16/19 1000 (!) 146/80 - - 63 13 90 %  09/16/19 0945 (!) 136/54 - - (!) 45 11 100 %  09/16/19 0930 (!) 164/84 - - (!) 58 15 100 %  09/16/19 0915 135/87 (!) 97.5 F (36.4 C) - (!) 57 (!) 29 100 %  .  RECENT EKG RESULTS:    Orders placed or performed during the hospital encounter of 09/07/19  . EKG 12-Lead  . EKG 12-Lead    DISCHARGE INSTRUCTIONS:    DISCHARGE MEDICATIONS:   Allergies as of 09/17/2019      Reactions   Gluten Meal Other (See Comments)   Unknown    Shrimp [shellfish Allergy] Other (See Comments)   Unknown    Sertraline Rash      Medication List    TAKE these medications   acetaminophen 650 MG CR tablet  Commonly known as: TYLENOL Take 650-1,300 mg by mouth every 8 (eight) hours as needed for pain.   levothyroxine 100 MCG tablet Commonly known as: SYNTHROID Take 100 mcg by mouth daily before breakfast.   metoprolol tartrate 50 MG tablet Commonly known as: LOPRESSOR Take 50 mg by mouth 2 (two) times daily.   oxyCODONE-acetaminophen 5-325 MG tablet Commonly known as: Percocet Take 1 tablet by mouth every 4 (four) hours as needed (max 6 q).   simvastatin 20 MG tablet Commonly known as: ZOCOR Take 20 mg by mouth daily.   warfarin 5 MG tablet Commonly known as: COUMADIN Take 5-7.5 mg by mouth See admin instructions. Take 1.5 tablets (7.5 mg) on Mondays & Fridays, then take 1 tablet (5 mg) by mouth on all other days.   ZyrTEC Allergy 10 MG Caps Generic drug: Cetirizine HCl Take 10 mg by mouth daily as needed (allergies.).       FOLLOW UP VISIT:   Follow-up Information    Justice Britain, MD.   Specialty: Orthopedic Surgery Why:  call to be seen in 10-14 days Contact information: [address]  [place] [address] [phone]           DISCHARGE TO: Home   DISCHARGE CONDITION:  Thereasa Parkin William Sandoval for Dr. Justice Britain 09/17/2019, 8:27 AM

## 2019-09-17 NOTE — Evaluation (Signed)
Occupational Therapy Evaluation Patient Details Name: William Sandoval MRN: [id number] DOB: 31-Mar-1946 Today's Date: 09/17/2019    History of Present Illness Pt is 73 y/o M with PMH arthritis, DLD, HTN, Afib and L rotator cuff tear. Pt now s/p L reverse total shoulder arthroplasty 09/16/2019.   Clinical Impression   Patient was seen for an OT evaluation this date. Pt lives in private residence alone. Prior to surgery, pt was active and independent. Pt has orders for L UE to be immobilized with sling and will be NWBing per MD. Patient presents with impaired strength/ROM, pain, and sensation to L UE with block not completely resolved yet. These impairments result in a decreased ability to perform self care tasks requiring MIN assist for UB/LB dressing and bathing and Supv assist for application of polar care and sling/immobilizer. Pt instructed in polar care mgt, sling/immobilizer mgt, ROM okay'ed for L UE elbow, wrist, and hand, L UE precautions, adaptive strategies for bathing/dressing/toileting/grooming, positioning and considerations for sleep, and home/routines modifications to maximize falls prevention, safety, and independence. Handout provided. OT adjusted sling/immobilizer and polar care to improve comfort, optimize positioning, and to maximize skin integrity/safety. Pt verbalized understanding of all education/training provided. Family assistance recommended at time of d/c for pt safety and assistance with ADLs short-term and pt reports his sister is coming from [facility] for temporary stay to assist him after sx.     Follow Up Recommendations  No OT follow up;Supervision - Intermittent    Equipment Recommendations  None recommended by OT    Recommendations for Other Services       Precautions / Restrictions Precautions Precautions: Other (comment) Precaution Comments: okay'ed-out of sling if sitting in controlled environment, sleep in sling, okay to use L UE for feeding/ADLs, pendulums and  lap slides okay, AROM of elbow, wrist, and hand to tolerance, NWB L shoulder, PROM/AAROM/AROM okay'ed w/in these parameters for ADLs: ER 20, ABD 45, FE 60. Restrictions Weight Bearing Restrictions: Yes LUE Weight Bearing: Non weight bearing      Mobility Bed Mobility Overal bed mobility: Modified Independent                Transfers Overall transfer level: Needs assistance   Transfers: Sit to/from Stand Sit to Stand: Supervision              Balance Overall balance assessment: Needs assistance   Sitting balance-Leahy Scale: Normal       Standing balance-Leahy Scale: Good Standing balance comment: no AD, only slight imbalance on initial stand                           ADL either performed or assessed with clinical judgement   ADL Overall ADL's : Needs assistance/impaired Eating/Feeding: Modified independent Eating/Feeding Details (indicate cue type and reason): increased time to open packets/containers Grooming: Wash/dry hands;Wash/dry face;Modified independent;Supervision/safety Grooming Details (indicate cue type and reason): one verbal/tactile cue to maintain precuations Upper Body Bathing: Modified independent;Supervision/ safety;Sitting Upper Body Bathing Details (indicate cue type and reason): increased time required to wash/dry underarms with instruction on see-saw technique for under L UE. Lower Body Bathing: Set up   Upper Body Dressing : Minimal assistance;Sitting   Lower Body Dressing: Modified independent;Supervision/safety;Sit to/from stand Lower Body Dressing Details (indicate cue type and reason): requires increased time, utilizes grab bar in restroom with R UE for stability. Requires verbal cue to not lean on L elbow on grab bar for stability d/t NWB Toilet Transfer: Supervision/safety;Ambulation;Regular  Museum/gallery exhibitions officer and Hygiene: Supervision/safety;Minimal assistance   Scientist, research (medical): Modified  independent;Supervision/safety   Functional mobility during ADLs: Supervision/safety       Vision Baseline Vision/History: Wears glasses Wears Glasses: At all times Patient Visual Report: No change from baseline       Perception     Praxis      Pertinent Vitals/Pain Pain Assessment: Faces Faces Pain Scale: Hurts little more Pain Location: L shoulder with donning/doffing sling, some lingering numbness/tingling in L UE Pain Descriptors / Indicators: Guarding;Sore Pain Intervention(s): Limited activity within patient's tolerance;Monitored during session;Repositioned     Hand Dominance Right   Extremity/Trunk Assessment Upper Extremity Assessment Upper Extremity Assessment: RUE deficits/detail;LUE deficits/detail RUE Deficits / Details: WFL, all grossly >3/5 LUE Deficits / Details: able to move L UE within confines of his precaution parameters on assessment. LUE: Unable to fully assess due to pain;Unable to fully assess due to immobilization   Lower Extremity Assessment Lower Extremity Assessment: Overall WFL for tasks assessed   Cervical / Trunk Assessment Cervical / Trunk Assessment: Normal   Communication Communication Communication: HOH   Cognition Arousal/Alertness: Awake/alert Behavior During Therapy: WFL for tasks assessed/performed Overall Cognitive Status: Within Functional Limits for tasks assessed                                 General Comments: some very mild repitition of same statements/potential mild short term memory loss versus potential perseveration?   General Comments       Exercises Other Exercises Other Exercises: pendulums of L UE from seated position with MIN verbal/visual cues for form, technique. Handout issued. Other Exercises: lap slides with L UE in seatd position with MIN verbal/visual cues for form, technique. Handout issued. Other Exercises: Education re: L UE precuations, handout issued.   Shoulder Instructions       Home Living Family/patient expects to be discharged to:: Private residence Living Arrangements: Alone Available Help at Discharge: Family;Available PRN/intermittently(sister is coming from [facility] temporarily to assist pt.)                                    Prior Functioning/Environment                   OT Problem List: Decreased strength;Decreased range of motion;Pain      OT Treatment/Interventions:      OT Goals(Current goals can be found in the care plan section) Acute Rehab OT Goals Patient Stated Goal: To go home and heal up OT Goal Formulation: All assessment and education complete, DC therapy Potential to Achieve Goals: Good  OT Frequency:     Barriers to D/C:            Co-evaluation              AM-PAC OT "6 Clicks" Daily Activity     Outcome Measure Help from another person eating meals?: None Help from another person taking care of personal grooming?: None Help from another person toileting, which includes using toliet, bedpan, or urinal?: A Little Help from another person bathing (including washing, rinsing, drying)?: A Little Help from another person to put on and taking off regular upper body clothing?: A Little Help from another person to put on and taking off regular lower body clothing?: A Little 6 Click Score: 20   End  of Session Equipment Utilized During Treatment: Gait belt Nurse Communication: Other (comment)(handout issued)  Activity Tolerance: Patient tolerated treatment well Patient left: in bed;with bed alarm set;with call bell/phone within reach  OT Visit Diagnosis: Muscle weakness (generalized) (M62.81)                Time: [id number] OT Time Calculation (min): 72 min Charges:  OT General Charges $OT Visit: 1 Visit OT Evaluation $OT Eval Moderate Complexity: 1 Mod OT Treatments $Self Care/Home Management : 8-22 mins $Therapeutic Activity: 8-22 mins $Therapeutic Exercise: 8-22 mins  Gerrianne Scale, MS,  OTR/L Pager [phone] 09/17/19, 10:31 AM

## 2019-09-20 ENCOUNTER — Other Ambulatory Visit: Payer: Self-pay

## 2019-09-20 NOTE — Patient Outreach (Addendum)
[facility]) Care Management  09/20/2019  William Sandoval 04/22/1946 [id number]     Transition of Care Referral  Referral Date: 09/20/2019 Referral Source: [employer] Discharge Report Date of Admission: 09/16/2019 Diagnosis: left rotator cuff repair s/p shoulder arthroplasty Date of Discharge: 09/17/2019 Facility: [place]: [place]    Outreach attempt # 1 to patient. Spoke with patient. He reports that he is doing fairly well. He denies any pain but voices some "soreness" to surgical site. Patient states that his surgical bandage is still in place. He was given wound care instructions and are following them. He reports that he was given exercises to do and plans to start working on them. He lives alone. He states that he is managing and able to care for himself. Patient is not currently at home and unable to complete med review. He denies any issues affording and/or managing meds. Patient goes for ortho MD follow up appt on 10/01/2019. He reports no issues with transportation. Per chart review, patient has PMH that includes but not limited to arthritis,A-fib, HLD and HTN. RN CM reviewed and discussed [facility] services. Patient gave verbal consent for services and for RN CM to follow up with him.     Plan: RN CM will follow up with patient within one week. RN CM will send welcome letter to patient.   [name], RN,BSN,CCM [place] Management Telephonic Care Management Coordinator Direct Phone: [phone] Toll Free: [phone] Fax: [phone]

## 2019-09-27 ENCOUNTER — Other Ambulatory Visit: Payer: Self-pay

## 2019-09-27 NOTE — Patient Outreach (Signed)
[facility]) Care Management  09/27/2019  William Sandoval 25-Feb-1946 [id number]  Social work referral received from [employer], [employer].  Referral stated that patient reports financial hardship and is interested in applying for Medicaid.  Successful outreach to patient today.  Based on income information provided by patient, he is under the income eligibility requirement.  Informed patient that things like property owned, life insurance policy, and medical bills will be evaluated as well.  Patient would like to apply and asked that application be mailed to him.  Will also send some financial/food resources that patient can utilize if needed. Mailing the following:  Medicaid application, Emergency Assistance Programs, [name], [name]. Will follow up with patient within the next two weeks to ensure receipt.   Ronn Melena, BSW Social Worker [phone]

## 2019-09-27 NOTE — Patient Outreach (Signed)
[name]) Care Management  [place]  09/27/2019   William Sandoval 07-08-1946 [id number]  Initial Assessment Transition of Care   Outreach attempt #1 to patient. Spoke with patient who voices he is is doing fairly well. He still has some occasional pain and soreness. He voices that he is primarily taking OTC Tylenol for pain mgmt. He reports med is effective in relieving pain. He is also keeping arm in sling and elevated to help along with ice therapy as advised. He states that he is managing fairly well living alone and caring for himself. However, he voices some financial hardship with making ends meet each month. Patient reports that he only brings in about $840/month. He is unsure of his Medicaid eligibly and has never looked into it. He is agreeable to [facility] SW referral for possible assistance with the matter.   Conditions: Per chart review, patient has PMH of arthritis, A-fib, HLD and HTN.   Appointments: Patient goes for ortho follow up appt on 10/01/2019 and PCP on 10/12/2019. He denies any issues with transportation.   Medications: Meds reviewed with patient. No issues noted. He is filling his med planner  Encounter Medications:  Outpatient Encounter Medications as of 09/27/2019  Medication Sig  . acetaminophen (TYLENOL) 650 MG CR tablet Take 650-1,300 mg by mouth every 8 (eight) hours as needed for pain.  . Cetirizine HCl (ZYRTEC ALLERGY) 10 MG CAPS Take 10 mg by mouth daily as needed (allergies.).   [facility] levothyroxine (SYNTHROID) 100 MCG tablet Take 100 mcg by mouth daily before breakfast.  . metoprolol (LOPRESSOR) 50 MG tablet Take 50 mg by mouth 2 (two) times daily.  [facility] oxyCODONE-acetaminophen (PERCOCET) 5-325 MG tablet Take 1 tablet by mouth every 4 (four) hours as needed (max 6 q).  . simvastatin (ZOCOR) 20 MG tablet Take 20 mg by mouth daily.   [facility] warfarin (COUMADIN) 5 MG tablet Take 5-7.5 mg by mouth See admin instructions. Take 1.5 tablets (7.5 mg)  on Mondays & Fridays, then take 1 tablet (5 mg) by mouth on all other days.   No facility-administered encounter medications on file as of 09/27/2019.     Functional Status:  In your present state of health, do you have any difficulty performing the following activities: 09/20/2019 09/16/2019  Hearing? N -  Vision? N -  Difficulty concentrating or making decisions? N -  Walking or climbing stairs? N -  Dressing or bathing? Y -  Comment recent rotator cuff surgery -  Doing errands, shopping? N N  Preparing Food and eating ? N -  Using the Toilet? N -  In the past six months, have you accidently leaked urine? N -  Do you have problems with loss of bowel control? N -  Managing your Medications? N -  Housekeeping or managing your Housekeeping? Y -  Comment recent rotator cuff surgery -  Some recent data might be hidden    Fall/Depression Screening: Fall Risk  09/20/2019  Falls in the past year? 0  Number falls in past yr: 0  Injury with Fall? 0  Risk for fall due to : Orthopedic patient;Medication side effect  Follow up Falls evaluation completed;Education provided   PHQ 2/9 Scores 09/20/2019  PHQ - 2 Score 0    Assessment:  THN CM Care Plan Problem One     Most Recent Value  Care Plan Problem One  Patient at risk for [facility] readmission due to co-morbidities.  Role Documenting the Problem One  Care Management Telephonic Coordinator  Care Plan for Problem One  Active  [facility] Long Term Goal   Patient will have no [facility] readmission over the next 31 days.  THN Long Term Goal Start Date  09/20/19  Interventions for Problem One Long Term Goal  RN CM discussed with pt s/s of worsening condition. RN CM assessed for any adverse sxs/SEs. RN CM confimred that pt. is aware of who,when,why and how to contact MDs if needed.  THN CM Short Term Goal #1   Patient will complete all post discharge follow up appts over the next 30 days.  THN CM Short Term Goal #1 Start Date  09/20/19   Interventions for Short Term Goal #1  RN CM reviewed appt schedule with pt and confirmed that patient has transportation to appt.  THN CM Short Term Goal #2   Patient will report having no infections or complications to surgical site over the next 30days.  THN CM Short Term Goal #2 Start Date  09/20/19  Interventions for Short Term Goal #2  RN CM assesesed for surgical site healing and any complications. RN CM reviewed pain mgmt with pt.      Plan:  RN CM will make outreach attempt to patient within one week. RN CM will send barriers letter and route encounter to PCP. RN CM will send [facility] SW referral for possible Medicaid eligibility assistance.  William Montgomery, RN,BSN,CCM [place] Management Telephonic Care Management Coordinator Direct Phone: [phone] Toll Free: [phone] Fax: [phone]

## 2019-10-01 DIAGNOSIS — Z96612 Presence of left artificial shoulder joint: Secondary | ICD-10-CM | POA: Diagnosis not present

## 2019-10-01 DIAGNOSIS — Z471 Aftercare following joint replacement surgery: Secondary | ICD-10-CM | POA: Diagnosis not present

## 2019-10-04 ENCOUNTER — Other Ambulatory Visit: Payer: Self-pay

## 2019-10-04 NOTE — Patient Outreach (Signed)
[facility]) Care Management  10/04/2019  William Sandoval Apr 04, 1946 [id number]   Transition of Care Week #3    Outreach attempt #1 to patient. No answer. RN CM left HIPAA compliant voicemail message along with contact info.     Plan: RN CM will make outreach attempt to patient within 3-4 business days.    [name], RN,BSN,CCM [facility] Management Telephonic Care Management Coordinator Direct Phone: [phone] Toll Free: [phone] Fax: [phone]

## 2019-10-05 ENCOUNTER — Other Ambulatory Visit: Payer: Self-pay

## 2019-10-05 NOTE — Patient Outreach (Signed)
[facility]) Care Management  10/05/2019  [name] March 22, 1946 [id number]   Transition of Care Week #3     Incoming call from patient returning RN CM call. Patient reports that he has been doing well and denies any acute issues or concerns at present. He is pleased to report that his shoulder/arm is doing well. His pain is controlled/managed at present. He has seen ortho MD and goes back the end of the month. He goes to see PCP on 10/12/2019. Patient states that ortho MD is referring him to outpatient rehab and is waiting to get that set up. He continues to do exercises as advised by MD to improve recovery. Patient has completed transition of care short term care program. He will be transitioned into CCM program. Patient states that he has not received Medicaid application in the mail from [place]. He was advised to follow up with in he has not received package within the next few days.      Plan: RN CM will follow up with patient within a month.  [name], RN,BSN,CCM [place] Management Telephonic Care Management Coordinator Direct Phone: [phone] Toll Free: [phone] Fax: [phone]

## 2019-10-06 ENCOUNTER — Ambulatory Visit: Payer: Self-pay

## 2019-10-11 ENCOUNTER — Ambulatory Visit: Payer: Self-pay

## 2019-10-11 ENCOUNTER — Other Ambulatory Visit: Payer: Self-pay

## 2019-10-11 NOTE — Patient Outreach (Signed)
[facility]) Care Management  10/11/2019  William Sandoval May 20, 1946 [id number]   Successful follow up call to patient today to ensure receipt of Medicaid application, financial resources, and food resources mailed on 09/27/19. Patient confirmed receipt and stated that he has started working to complete application.  Addressed several questions that he had regarding documentation that needs to be submitted with application.  Patient stated that he intends to take application directly to [place] once completed versus mailing it.  Encouraged him to call first regarding office hours and whether or not appointment is needed due to [place] restrictions.   Closing social work case at this time but did encourage him to call if additional needs arise.  Ronn Melena, BSW Social Worker [phone]

## 2019-10-12 DIAGNOSIS — M25512 Pain in left shoulder: Secondary | ICD-10-CM | POA: Diagnosis not present

## 2019-10-12 DIAGNOSIS — Z7901 Long term (current) use of anticoagulants: Secondary | ICD-10-CM | POA: Diagnosis not present

## 2019-10-12 DIAGNOSIS — I482 Chronic atrial fibrillation, unspecified: Secondary | ICD-10-CM | POA: Diagnosis not present

## 2019-10-18 DIAGNOSIS — L309 Dermatitis, unspecified: Secondary | ICD-10-CM | POA: Diagnosis not present

## 2019-10-26 DIAGNOSIS — Z7901 Long term (current) use of anticoagulants: Secondary | ICD-10-CM | POA: Diagnosis not present

## 2019-10-26 DIAGNOSIS — I482 Chronic atrial fibrillation, unspecified: Secondary | ICD-10-CM | POA: Diagnosis not present

## 2019-10-26 DIAGNOSIS — L249 Irritant contact dermatitis, unspecified cause: Secondary | ICD-10-CM | POA: Diagnosis not present

## 2019-11-01 DIAGNOSIS — Z471 Aftercare following joint replacement surgery: Secondary | ICD-10-CM | POA: Diagnosis not present

## 2019-11-01 DIAGNOSIS — Z96612 Presence of left artificial shoulder joint: Secondary | ICD-10-CM | POA: Diagnosis not present

## 2019-11-02 ENCOUNTER — Other Ambulatory Visit: Payer: Self-pay

## 2019-11-02 NOTE — Patient Outreach (Addendum)
[facility]) Care Management  11/02/2019  William Sandoval 1946/02/14 [id number]   Telephone Assessment    Outreach attempt #1 to patient. No answer. RN CM left HIPAA compliant voicemail message along with contact info.     Plan: RN CM will make outreach attempt to patient within 3-4 business days.   [name], RN,BSN,CCM [place] Management Telephonic Care Management Coordinator Direct Phone: [phone] Toll Free: [phone] Fax: [phone]

## 2019-11-02 NOTE — Patient Outreach (Signed)
[facility]) Care Management  11/02/2019  William Sandoval Jan 07, 1946 [id number]   Telephone Assessment  Incoming call from patient. Spoke with patient who denies any acute issues or concerns at present. He voices that he went to see ortho MD on yesterday. He was told that his shoulder is healing and making progress. He no longer has to use sling. Patient reports that pain remains controlled and very minimal. He denies any pain at present. He is scheduled to begin outpatient therapy soon and is hopeful that it will help restore his range of motion and strength to shoulder. Patient reports that he has broken out with a rash on his back. He saw dermatologist recently about it and was told he had dry skin and is applying Sarna lotion to affected area with some relief. He denies any RN CM needs or concerns at this time.     THN CM Care Plan Problem One     Most Recent Value  Care Plan Problem One  Patient at risk for hospital readmission due to co-morbidities.  Role Documenting the Problem One  Care Management Telephonic [place] for Problem One  Active  [facility] Long Term Goal   Patient will have no hospital readmission over the next 31 days.  THN Long Term Goal Start Date  09/20/19  THN Long Term Goal Met Date  11/02/19  [facility] CM Short Term Goal #1   Patient will complete all post discharge follow up appts over the next 30 days.  THN CM Short Term Goal #1 Start Date  09/20/19  THN CM Short Term Goal #1 Met Date  11/02/19  THN CM Short Term Goal #2   Patient will report having no infections or complications to surgical site over the next 30days.  THN CM Short Term Goal #2 Start Date  09/20/19  THN CM Short Term Goal #2 Met Date  11/02/19  THN CM Short Term Goal #3  Patient will report actively particpating in outpatient rehab over the next 30 days.  THN CM Short Term Goal #3 Start Date  11/02/19  Interventions for Short Tern Goal #3  RN CM assesse healing/progression  and discussed benefits of therapy. RN CM assessed for any barriers to starting therapy.   THN CM Short Term Goal #4  Patient will report a tolerable level of pain(3 or less) over the next 30 days.  THN CM Short Term Goal #4 Start Date  11/02/19  Interventions for Short Term Goal #4  RN CM assessed for pain. RN CM reviewed pain mgmt measures with pt.      Plan:  RN CM discussed with patient next outreach within a month. Patient gave verbal consent and in agreement with RN CM follow up timeframe. Patient aware that they may contact RN CM sooner for any issues or concerns.  [name], RN,BSN,CCM [place] Management Telephonic Care Management Coordinator Direct Phone: [phone] Toll Free: [phone] Fax: [phone]

## 2019-11-03 DIAGNOSIS — Z7901 Long term (current) use of anticoagulants: Secondary | ICD-10-CM | POA: Diagnosis not present

## 2019-11-03 DIAGNOSIS — I482 Chronic atrial fibrillation, unspecified: Secondary | ICD-10-CM | POA: Diagnosis not present

## 2019-11-03 DIAGNOSIS — K580 Irritable bowel syndrome with diarrhea: Secondary | ICD-10-CM | POA: Diagnosis not present

## 2019-11-03 DIAGNOSIS — L249 Irritant contact dermatitis, unspecified cause: Secondary | ICD-10-CM | POA: Diagnosis not present

## 2019-11-03 DIAGNOSIS — Z96612 Presence of left artificial shoulder joint: Secondary | ICD-10-CM | POA: Diagnosis not present

## 2019-11-08 DIAGNOSIS — I10 Essential (primary) hypertension: Secondary | ICD-10-CM | POA: Diagnosis not present

## 2019-11-08 DIAGNOSIS — E039 Hypothyroidism, unspecified: Secondary | ICD-10-CM | POA: Diagnosis not present

## 2019-11-08 DIAGNOSIS — I482 Chronic atrial fibrillation, unspecified: Secondary | ICD-10-CM | POA: Diagnosis not present

## 2019-11-08 DIAGNOSIS — E782 Mixed hyperlipidemia: Secondary | ICD-10-CM | POA: Diagnosis not present

## 2019-11-08 DIAGNOSIS — M25512 Pain in left shoulder: Secondary | ICD-10-CM | POA: Diagnosis not present

## 2019-11-08 DIAGNOSIS — Z7901 Long term (current) use of anticoagulants: Secondary | ICD-10-CM | POA: Diagnosis not present

## 2019-11-10 ENCOUNTER — Ambulatory Visit (HOSPITAL_COMMUNITY): Payer: Medicare HMO | Attending: Orthopedic Surgery | Admitting: Occupational Therapy

## 2019-11-10 ENCOUNTER — Other Ambulatory Visit: Payer: Self-pay

## 2019-11-10 ENCOUNTER — Encounter (HOSPITAL_COMMUNITY): Payer: Self-pay | Admitting: Occupational Therapy

## 2019-11-10 DIAGNOSIS — R29898 Other symptoms and signs involving the musculoskeletal system: Secondary | ICD-10-CM | POA: Insufficient documentation

## 2019-11-10 DIAGNOSIS — M25612 Stiffness of left shoulder, not elsewhere classified: Secondary | ICD-10-CM | POA: Diagnosis not present

## 2019-11-10 DIAGNOSIS — M25512 Pain in left shoulder: Secondary | ICD-10-CM | POA: Insufficient documentation

## 2019-11-10 NOTE — Therapy (Signed)
[place], [place], [address] Phone: [phone]   Fax:  [phone]  Occupational Therapy Evaluation  Patient Details  Name: William Sandoval MRN: [id number] Date of Birth: 1946/05/18 Referring Provider (OT): Dr. Justice Britain   Encounter Date: 11/10/2019  OT End of Session - 11/10/19 0933    Visit Number  1    Number of Visits  16    Date for OT Re-Evaluation  01/09/20   mini-reassessment 12/08/2019   Authorization Type  [employer]; $40 copay, no visit limit    Authorization Time Period  progress note by 10th visit    Authorization - Visit Number  1    Authorization - Number of Visits  10    OT Start Time  [phone]    OT Stop Time  0930    OT Time Calculation (min)  38 min    Activity Tolerance  Patient tolerated treatment well    Behavior During Therapy  [facility] for tasks assessed/performed       Past Medical History:  Diagnosis Date  . Arthritis    shoulder,  . Chronic atrial fibrillation ([place])   . Dyslipidemia   . Dysrhythmia    AFib  . Epistaxis   . Hypertension     Past Surgical History:  Procedure Laterality Date  . REVERSE SHOULDER ARTHROPLASTY Left 09/16/2019   Procedure: REVERSE SHOULDER ARTHROPLASTY;  Surgeon: Justice Britain, MD;  Location: [facility];  Service: Orthopedics;  Laterality: Left;  138min  . SHOULDER SURGERY     Right    There were no vitals filed for this visit.  Subjective Assessment - 11/10/19 0922    Subjective   S: I can't do those rotational movements that I need.    Pertinent History  Pt is a 73 y/o male s/p left reverse TSA on 09/16/2019. Pt reports difficulty with ADLs and functional movements of LUE. Pt was referred to occupational therapy for evaluation and treatment by Dr. Justice Britain.    Special Tests  FOTO 51/100    Patient Stated Goals  To be able to move my arm correctly.    Currently in Pain?  Yes    Pain Score  5     Pain Location  Shoulder    Pain Orientation   Left    Pain Descriptors / Indicators  Aching;Sore    Pain Type  Acute pain    Pain Radiating Towards  N/A    Pain Onset  More than a month ago    Pain Frequency  Constant    Aggravating Factors   movement    Pain Relieving Factors  tylenol or Rx pain medication    Effect of Pain on Daily Activities  max effect on ADLs    Multiple Pain Sites  No        [facility] OT Assessment - 11/10/19 0845      Assessment   Medical Diagnosis  s/p left reverse TSA    Referring Provider (OT)  Dr. Justice Britain    Onset Date/Surgical Date  09/16/19    Hand Dominance  Right    Next MD Visit  12/03/2019    Prior Therapy  acute OT      Precautions   Precautions  Shoulder    Type of Shoulder Precautions  P/ROM, AA/ROM, A/ROM and progress as tolerated      Balance Screen   Has the patient fallen in the past 6 months  No  Prior Function   Level of Independence  Independent    Vocation  Retired    Leisure  shooting, taking pictures      ADL   ADL comments  Pt is having difficulty with dressing, reaching overhead and behind back, bathing, sleeping. Pt is having difficulty using LUE during all ADL tasks, driving      Written Expression   Dominant Hand  Left      Cognition   Overall Cognitive Status  Within Functional Limits for tasks assessed      Observation/Other Assessments   Focus on Therapeutic Outcomes (FOTO)   51/100      ROM / Strength   AROM / PROM / Strength  AROM;PROM;Strength      Palpation   Palpation comment  mod fascial restrictions in upper arm, anterior shoulder, and trapezius regions      AROM   Overall AROM Comments  Assessed seated, er/IR adducted    AROM Assessment Site  Shoulder    Right/Left Shoulder  Left    Left Shoulder Flexion  78 Degrees    Left Shoulder ABduction  61 Degrees    Left Shoulder Internal Rotation  90 Degrees    Left Shoulder External Rotation  -8 Degrees      PROM   Overall PROM Comments  Assessed supine, er/IR adducted    PROM Assessment  Site  Shoulder    Right/Left Shoulder  Left    Left Shoulder Flexion  90 Degrees    Left Shoulder ABduction  68 Degrees    Left Shoulder Internal Rotation  90 Degrees    Left Shoulder External Rotation  12 Degrees      Strength   Overall Strength Comments  Assessed seated, er/IR adducted    Strength Assessment Site  Shoulder    Right/Left Shoulder  Left    Left Shoulder Flexion  3-/5    Left Shoulder ABduction  3-/5    Left Shoulder Internal Rotation  3/5    Left Shoulder External Rotation  2+/5                      OT Education - 11/10/19 0921    Education Details  table slides    Person(s) Educated  Patient    Methods  Explanation;Demonstration;Handout    Comprehension  Verbalized understanding;Returned demonstration       OT Short Term Goals - 11/10/19 0939      OT SHORT TERM GOAL #1   Title  Pt will be provided with and educated on HEP to improve use of LUE during ADL completion.    Time  4    Period  Weeks    Status  New    Target Date  12/10/19      OT SHORT TERM GOAL #2   Title  Pt will increase P/ROM of LUE to [facility] to improve ability to use LUE as assist when completing dressing tasks.    Time  4    Period  Weeks    Status  New      OT SHORT TERM GOAL #3   Title  Pt will improve LUE strength to 3+/5 to increase ability to use LUE for overhead reaching tasks.        OT Long Term Goals - 11/10/19 0940      OT LONG TERM GOAL #1   Title  Pt will return to highest level of functioning and independence in ADL completion using LUE as  non-dominant.    Time  8    Period  Weeks    Status  New    Target Date  01/09/20      OT LONG TERM GOAL #2   Title  Pt will decrease pain in LUE to 3/10 or less to improve ability to sleep for 4 consecutive hours or more at night.    Time  8    Period  Weeks    Status  New      OT LONG TERM GOAL #3   Title  Pt will increase LUE A/ROM to [facility] to improve ability to reach over head and behind back during dressing  and bathing tasks.    Time  8    Period  Weeks    Status  New      OT LONG TERM GOAL #4   Title  Pt will decrease LUE fascial restrictions to minimal amounts or less to improve mobility required for functional reaching tasks.    Time  8    Period  Weeks    Status  New      OT LONG TERM GOAL #5   Title  Pt will increase LUE strength to 4+/5 or greater to increase ability to hold up cameras during photography.    Time  8    Period  Weeks    Status  New            Plan - 11/10/19 0935    Clinical Impression Statement  A: Pt is a 73 y/o male s/p left reverse TSA on 09/16/2019 presenting with limitations in ADL completion and functional tasks using LUE as non-dominant. Pt with max guarding during passive ROM assessment requiring consistent verbal cuing for relaxing and allowing passive stretching.    OT Occupational Profile and History  Problem Focused Assessment - Including review of records relating to presenting problem    Occupational performance deficits (Please refer to evaluation for details):  ADL's;IADL's;Rest and Sleep;Leisure    Body Structure / Function / Physical Skills  ADL;UE functional use;Fascial restriction;Endurance;Pain;ROM;IADL;Strength    Rehab Potential  Good    Clinical Decision Making  Limited treatment options, no task modification necessary    Comorbidities Affecting Occupational Performance:  None    Modification or Assistance to Complete Evaluation   No modification of tasks or assist necessary to complete eval    OT Frequency  2x / week    OT Duration  8 weeks    OT Treatment/Interventions  Self-care/ADL training;Ultrasound;Patient/family education;Passive range of motion;Cryotherapy;Electrical Stimulation;Moist Heat;Therapeutic exercise;Manual Therapy;Therapeutic activities    Plan  P: Pt will benefit from skilled OT services to decrease pain and fascial restrictions, increase ROM, strength, and functional use of LUE during functional tasks. Treatment  plan: myofascial release, manual therapy, P/ROM, AA/ROM, A/ROM, general LUE strengthening, scapular stability and strengthening, modalities prn    Consulted and Agree with Plan of Care  Patient       Patient will benefit from skilled therapeutic intervention in order to improve the following deficits and impairments:   Body Structure / Function / Physical Skills: ADL, UE functional use, Fascial restriction, Endurance, Pain, ROM, IADL, Strength       Visit Diagnosis: Acute pain of left shoulder  Stiffness of left shoulder, not elsewhere classified  Other symptoms and signs involving the musculoskeletal system    Problem List Patient Active Problem List   Diagnosis Date Noted  . 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 Long term (  current) use of anticoagulants 03/14/2011  . HYPOTHYROIDISM 12/26/2009  . HYPERLIPIDEMIA 12/26/2009  . ATRIAL FIBRILLATION 12/26/2009  . SHOULDER PAIN, LEFT 12/26/2009   Guadelupe Sabin, OTR/L  [phone] 11/10/2019, 9:44 AM  [facility] [address] [place], [place], [address] Phone: [phone]   Fax:  [phone]  Name: LES GEESLIN MRN: [id number] Date of Birth: [birth year]

## 2019-11-10 NOTE — Patient Instructions (Signed)
1) SHOULDER: Flexion On Table   Place hands on towel placed on table, elbows straight. Lean forward with you upper body, pushing towel away from body.  _15__ reps per set, _3__ sets per day  2) Abduction (Passive)   With arm out to side, resting on towel placed on table with palm DOWN, keeping trunk away from table, lean to the side while pushing towel away from body.  Repeat __15__ times. Do __3__ sessions per day.  Copyright  VHI. All rights reserved.     3) Internal Rotation (Assistive)   Seated with elbow bent at right angle and held against side, slide arm on table surface in an inward arc keeping elbow anchored in place. Repeat _15___ times. Do __3__ sessions per day. Activity: Use this motion to brush crumbs off the table.  Copyright  VHI. All rights reserved.    

## 2019-11-16 ENCOUNTER — Encounter (HOSPITAL_COMMUNITY): Payer: Self-pay | Admitting: Occupational Therapy

## 2019-11-16 ENCOUNTER — Ambulatory Visit (HOSPITAL_COMMUNITY): Payer: Medicare HMO | Admitting: Occupational Therapy

## 2019-11-16 ENCOUNTER — Other Ambulatory Visit: Payer: Self-pay

## 2019-11-16 DIAGNOSIS — M25512 Pain in left shoulder: Secondary | ICD-10-CM

## 2019-11-16 DIAGNOSIS — M25612 Stiffness of left shoulder, not elsewhere classified: Secondary | ICD-10-CM | POA: Diagnosis not present

## 2019-11-16 DIAGNOSIS — R29898 Other symptoms and signs involving the musculoskeletal system: Secondary | ICD-10-CM | POA: Diagnosis not present

## 2019-11-16 NOTE — Therapy (Signed)
[place], [place], [address] Phone: [phone]   Fax:  [phone]  Occupational Therapy Treatment  Patient Details  Name: William Sandoval MRN: [id number] Date of Birth: 1946/08/12 Referring Provider (OT): Dr. Justice Britain   Encounter Date: 11/16/2019  OT End of Session - 11/16/19 1642    Visit Number  2    Number of Visits  16    Date for OT Re-Evaluation  01/09/20   mini-reassessment 12/08/2019   Authorization Type  [employer]; $40 copay, no visit limit    Authorization Time Period  progress note by 10th visit    Authorization - Visit Number  2    Authorization - Number of Visits  10    OT Start Time  1600    OT Stop Time  1640    OT Time Calculation (min)  40 min    Activity Tolerance  Patient tolerated treatment well    Behavior During Therapy  [facility] for tasks assessed/performed       Past Medical History:  Diagnosis Date  . Arthritis    shoulder,  . Chronic atrial fibrillation ([place])   . Dyslipidemia   . Dysrhythmia    AFib  . Epistaxis   . Hypertension     Past Surgical History:  Procedure Laterality Date  . REVERSE SHOULDER ARTHROPLASTY Left 09/16/2019   Procedure: REVERSE SHOULDER ARTHROPLASTY;  Surgeon: Justice Britain, MD;  Location: [facility];  Service: Orthopedics;  Laterality: Left;  142mn  . SHOULDER SURGERY     Right    There were no vitals filed for this visit.  Subjective Assessment - 11/16/19 1600    Subjective   S: I've been doing the exercises with relative ease.    Currently in Pain?  No/denies         [facility]      Assessment   Medical Diagnosis  s/p left reverse TSA    Referring Provider (OT)  Dr. KJustice Britain     Precautions   Precautions  Shoulder    Type of Shoulder Precautions  P/ROM, AA/ROM, A/ROM and progress as tolerated               OT Treatments/Exercises (OP) - 11/16/19 1604      Exercises   Exercises  Shoulder       Shoulder Exercises: Supine   Protraction  PROM;10 reps    Horizontal ABduction  PROM;10 reps    External Rotation  PROM;10 reps    Internal Rotation  PROM;10 reps    Flexion  PROM;10 reps    ABduction  PROM;10 reps      Shoulder Exercises: Seated   Elevation  AROM;10 reps    Extension  AROM;10 reps    Row  AROM;10 reps    Other Seated Exercises  scapular depression, A/ROM 10X      Shoulder Exercises: Therapy Ball   Flexion  15 reps    ABduction  15 reps      Shoulder Exercises: ROM/Strengthening   Thumb Tacks  1' low level      Shoulder Exercises: Isometric Strengthening   Flexion  Supine;3X5"    Extension  Supine;3X5"    External Rotation  Supine;3X5"    Internal Rotation  Supine;3X5"    ABduction  Supine;3X5"    ADduction  Supine;3X5"      Manual Therapy   Manual Therapy  Myofascial release    Manual  therapy comments  completed separately from therapeutic exercises    Myofascial Release  myofascial release to left upper arm, trapezius, and scapular regions to decrease pain and fascial restrictions and increase joint ROM               OT Short Term Goals - 11/16/19 1643      OT SHORT TERM GOAL #1   Title  Pt will be provided with and educated on HEP to improve use of LUE during ADL completion.    Time  4    Period  Weeks    Status  On-going    Target Date  12/10/19      OT SHORT TERM GOAL #2   Title  Pt will increase P/ROM of LUE to WFL to improve ability to use LUE as assist when completing dressing tasks.    Time  4    Period  Weeks    Status  On-going      OT SHORT TERM GOAL #3   Title  Pt will improve LUE strength to 3+/5 to increase ability to use LUE for overhead reaching tasks.    Time  4    Period  Weeks    Status  On-going        OT Long Term Goals - 11/10/19 0940      OT LONG TERM GOAL #1   Title  Pt will return to highest level of functioning and independence in ADL completion using LUE as non-dominant.    Time  8    Period   Weeks    Status  On-going    Target Date  01/09/20      OT LONG TERM GOAL #2   Title  Pt will decrease pain in LUE to 3/10 or less to improve ability to sleep for 4 consecutive hours or more at night.    Time  8    Period  Weeks    Status  On-going      OT LONG TERM GOAL #3   Title  Pt will increase LUE A/ROM to WFL to improve ability to reach over head and behind back during dressing and bathing tasks.    Time  8    Period  Weeks    Status  On-going      OT LONG TERM GOAL #4   Title  Pt will decrease LUE fascial restrictions to minimal amounts or less to improve mobility required for functional reaching tasks.    Time  8    Period  Weeks    Status  On-going      OT LONG TERM GOAL #5   Title  Pt will increase LUE strength to 4+/5 or greater to increase ability to hold up cameras during photography.    Time  8    Period  Weeks    Status  On-going            Plan - 11/16/19 1633    Clinical Impression Statement  A: Initiated myofascial techniques and manual therapy, passive stretching, and isometrics today. Pt putting max effort into isometrics with poor form requiring verbal cuing for putting less pressure on arm and concentrating on good form with holds. During therapy ball tasks pt hiking left shoulder, verbal cuing to slow down and focus on not activating trapezius during stretching.    Body Structure / Function / Physical Skills  ADL;UE functional use;Fascial restriction;Endurance;Pain;ROM;IADL;Strength    Plan  P: Attempt AA/ROM in supine, work on   improving form and scapular mobility during scapular A/ROM, add prot/ret/elev/dep       Patient will benefit from skilled therapeutic intervention in order to improve the following deficits and impairments:   Body Structure / Function / Physical Skills: ADL, UE functional use, Fascial restriction, Endurance, Pain, ROM, IADL, Strength       Visit Diagnosis: Stiffness of left shoulder, not elsewhere classified  Acute  pain of left shoulder  Other symptoms and signs involving the musculoskeletal system    Problem List Patient Active Problem List   Diagnosis Date Noted  . 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 Long term (current) use of anticoagulants 03/14/2011  . HYPOTHYROIDISM 12/26/2009  . HYPERLIPIDEMIA 12/26/2009  . ATRIAL FIBRILLATION 12/26/2009  . SHOULDER PAIN, LEFT 12/26/2009   Guadelupe Sabin, OTR/L  [phone] 11/16/2019, 4:43 PM  [facility] [address] [place], [place], [address] Phone: [phone]   Fax:  [phone]  Name: William Sandoval MRN: [id number] Date of Birth: December 17, 1945

## 2019-11-18 ENCOUNTER — Other Ambulatory Visit: Payer: Self-pay

## 2019-11-18 ENCOUNTER — Ambulatory Visit (HOSPITAL_COMMUNITY): Payer: Medicare HMO | Admitting: Occupational Therapy

## 2019-11-18 ENCOUNTER — Encounter (HOSPITAL_COMMUNITY): Payer: Self-pay | Admitting: Occupational Therapy

## 2019-11-18 DIAGNOSIS — R29898 Other symptoms and signs involving the musculoskeletal system: Secondary | ICD-10-CM | POA: Diagnosis not present

## 2019-11-18 DIAGNOSIS — M25612 Stiffness of left shoulder, not elsewhere classified: Secondary | ICD-10-CM

## 2019-11-18 DIAGNOSIS — M25512 Pain in left shoulder: Secondary | ICD-10-CM | POA: Diagnosis not present

## 2019-11-18 NOTE — Therapy (Signed)
[place], [place], [address] Phone: [phone]   Fax:  [phone]  Occupational Therapy Treatment  Patient Details  Name: William Sandoval MRN: [id number] Date of Birth: 10/19/1946 Referring Provider (OT): Dr. Justice Britain   Encounter Date: 11/18/2019  OT End of Session - 11/18/19 1516    Visit Number  3    Number of Visits  16    Date for OT Re-Evaluation  01/09/20   mini-reassessment 12/08/2019   Authorization Type  [employer]; $40 copay, no visit limit    Authorization Time Period  progress note by 10th visit    Authorization - Visit Number  3    Authorization - Number of Visits  10    OT Start Time  1430    OT Stop Time  1513    OT Time Calculation (min)  43 min    Activity Tolerance  Patient tolerated treatment well    Behavior During Therapy  [facility] for tasks assessed/performed       Past Medical History:  Diagnosis Date  . Arthritis    shoulder,  . Chronic atrial fibrillation ([place])   . Dyslipidemia   . Dysrhythmia    AFib  . Epistaxis   . Hypertension     Past Surgical History:  Procedure Laterality Date  . REVERSE SHOULDER ARTHROPLASTY Left 09/16/2019   Procedure: REVERSE SHOULDER ARTHROPLASTY;  Surgeon: Justice Britain, MD;  Location: [facility];  Service: Orthopedics;  Laterality: Left;  183min  . SHOULDER SURGERY     Right    There were no vitals filed for this visit.  Subjective Assessment - 11/18/19 1432    Subjective   S: It's sore today for some reason.    Currently in Pain?  Yes    Pain Score  3     Pain Location  Shoulder    Pain Orientation  Left    Pain Descriptors / Indicators  Sore    Pain Type  Acute pain    Pain Radiating Towards  N/A    Pain Onset  More than a month ago    Pain Frequency  Constant    Aggravating Factors   movement    Pain Relieving Factors  tylenol or Rx pain medication    Effect of Pain on Daily Activities  max effect on ADLs    Multiple Pain Sites   No         [facility] OT Assessment - 11/18/19 1431      Assessment   Medical Diagnosis  s/p left reverse TSA      Precautions   Precautions  Shoulder    Type of Shoulder Precautions  P/ROM, AA/ROM, A/ROM and progress as tolerated               OT Treatments/Exercises (OP) - 11/18/19 1433      Exercises   Exercises  Shoulder      Shoulder Exercises: Supine   Protraction  PROM;AAROM;10 reps    Horizontal ABduction  PROM;AAROM;10 reps    External Rotation  PROM;AAROM;10 reps    Internal Rotation  PROM;AAROM;10 reps    Flexion  PROM;AAROM;10 reps    ABduction  PROM;AAROM;10 reps      Shoulder Exercises: Seated   Elevation  AROM;10 reps    Extension  AROM;10 reps    Row  AROM;10 reps    Other Seated Exercises  scapular depression, A/ROM 10X      Shoulder Exercises:  Isometric Strengthening   Flexion  Supine;3X5"    Extension  Supine;3X5"    External Rotation  Supine;3X5"    Internal Rotation  Supine;3X5"    ABduction  Supine;3X5"    ADduction  Supine;3X5"      Manual Therapy   Manual Therapy  Myofascial release    Manual therapy comments  completed separately from therapeutic exercises    Myofascial Release  myofascial release to left upper arm, trapezius, and scapular regions to decrease pain and fascial restrictions and increase joint ROM               OT Short Term Goals - 11/16/19 1643      OT SHORT TERM GOAL #1   Title  Pt will be provided with and educated on HEP to improve use of LUE during ADL completion.    Time  4    Period  Weeks    Status  On-going    Target Date  12/10/19      OT SHORT TERM GOAL #2   Title  Pt will increase P/ROM of LUE to [facility] to improve ability to use LUE as assist when completing dressing tasks.    Time  4    Period  Weeks    Status  On-going      OT SHORT TERM GOAL #3   Title  Pt will improve LUE strength to 3+/5 to increase ability to use LUE for overhead reaching tasks.    Time  4    Period  Weeks    Status   On-going        OT Long Term Goals - 11/16/19 1643      OT LONG TERM GOAL #1   Title  Pt will return to highest level of functioning and independence in ADL completion using LUE as non-dominant.    Time  8    Period  Weeks    Status  On-going      OT LONG TERM GOAL #2   Title  Pt will decrease pain in LUE to 3/10 or less to improve ability to sleep for 4 consecutive hours or more at night.    Time  8    Period  Weeks    Status  On-going      OT LONG TERM GOAL #3   Title  Pt will increase LUE A/ROM to [facility] to improve ability to reach over head and behind back during dressing and bathing tasks.    Time  8    Period  Weeks    Status  On-going      OT LONG TERM GOAL #4   Title  Pt will decrease LUE fascial restrictions to minimal amounts or less to improve mobility required for functional reaching tasks.    Time  8    Period  Weeks    Status  On-going      OT LONG TERM GOAL #5   Title  Pt will increase LUE strength to 4+/5 or greater to increase ability to hold up cameras during photography.    Time  8    Period  Weeks    Status  On-going            Plan - 11/18/19 1509    Clinical Impression Statement  A: Continued with manual therapy this session. Pt requiring max verbal cuing and encouragement for relaxing and breathing to allow for passive stretching today. Continues to require cuing for correct completion of isometrics. Added AA/ROM  in supine, verbal cuing for form and technique, cuing for breathing and for allowing RUE to carry LUE weight. Tactile cuing for scapular retraction with scapular mobility exercises.    Body Structure / Function / Physical Skills  ADL;UE functional use;Fascial restriction;Endurance;Pain;ROM;IADL;Strength    Plan  P: Continue working on improving form and independence in exercises, work on improving ROM tolerated during passive stretching       Patient will benefit from skilled therapeutic intervention in order to improve the following  deficits and impairments:   Body Structure / Function / Physical Skills: ADL, UE functional use, Fascial restriction, Endurance, Pain, ROM, IADL, Strength       Visit Diagnosis: Stiffness of left shoulder, not elsewhere classified  Acute pain of left shoulder  Other symptoms and signs involving the musculoskeletal system    Problem List Patient Active Problem List   Diagnosis Date Noted  . 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 Long term (current) use of anticoagulants 03/14/2011  . HYPOTHYROIDISM 12/26/2009  . HYPERLIPIDEMIA 12/26/2009  . ATRIAL FIBRILLATION 12/26/2009  . SHOULDER PAIN, LEFT 12/26/2009   William Sandoval, OTR/L  [phone] 11/18/2019, 3:17 PM  [facility] [address] [place], [place], [address] Phone: [phone]   Fax:  [phone]  Name: William Sandoval MRN: [id number] Date of Birth: 04-18-1946

## 2019-11-22 ENCOUNTER — Other Ambulatory Visit: Payer: Self-pay

## 2019-11-22 ENCOUNTER — Encounter (HOSPITAL_COMMUNITY): Payer: Self-pay | Admitting: Occupational Therapy

## 2019-11-22 ENCOUNTER — Ambulatory Visit (HOSPITAL_COMMUNITY): Payer: Medicare HMO | Admitting: Occupational Therapy

## 2019-11-22 DIAGNOSIS — R29898 Other symptoms and signs involving the musculoskeletal system: Secondary | ICD-10-CM | POA: Diagnosis not present

## 2019-11-22 DIAGNOSIS — M25512 Pain in left shoulder: Secondary | ICD-10-CM | POA: Diagnosis not present

## 2019-11-22 DIAGNOSIS — M25612 Stiffness of left shoulder, not elsewhere classified: Secondary | ICD-10-CM

## 2019-11-22 NOTE — Therapy (Signed)
[place], [place], [address] Phone: [phone]   Fax:  [phone]  Occupational Therapy Treatment  Patient Details  Name: William Sandoval MRN: [id number] Date of Birth: 1946-01-07 Referring Provider (OT): Dr. Justice Britain   Encounter Date: 11/22/2019  OT End of Session - 11/22/19 1646    Visit Number  4    Number of Visits  16    Date for OT Re-Evaluation  01/09/20   mini-reassessment 12/08/2019   Authorization Type  [employer]; $40 copay, no visit limit    Authorization Time Period  progress note by 10th visit    Authorization - Visit Number  4    Authorization - Number of Visits  10    OT Start Time  1600    OT Stop Time  1645    OT Time Calculation (min)  45 min    Activity Tolerance  Patient tolerated treatment well    Behavior During Therapy  [facility] for tasks assessed/performed       Past Medical History:  Diagnosis Date  . Arthritis    shoulder,  . Chronic atrial fibrillation ([place])   . Dyslipidemia   . Dysrhythmia    AFib  . Epistaxis   . Hypertension     Past Surgical History:  Procedure Laterality Date  . REVERSE SHOULDER ARTHROPLASTY Left 09/16/2019   Procedure: REVERSE SHOULDER ARTHROPLASTY;  Surgeon: Justice Britain, MD;  Location: [facility];  Service: Orthopedics;  Laterality: Left;  169min  . SHOULDER SURGERY     Right    There were no vitals filed for this visit.  Subjective Assessment - 11/22/19 1559    Subjective   S: I lifted a box but I had to set it down.    Currently in Pain?  No/denies         [facility] OT Assessment - 11/22/19 1558      Assessment   Medical Diagnosis  s/p left reverse TSA      Precautions   Precautions  Shoulder    Type of Shoulder Precautions  P/ROM, AA/ROM, A/ROM and progress as tolerated               OT Treatments/Exercises (OP) - 11/22/19 1603      Exercises   Exercises  Shoulder      Shoulder Exercises: Supine   Protraction   PROM;AAROM;10 reps    Horizontal ABduction  PROM;AAROM;10 reps    External Rotation  PROM;AAROM;10 reps    Internal Rotation  PROM;AAROM;10 reps    Flexion  PROM;AAROM;10 reps    ABduction  PROM;AAROM;10 reps      Shoulder Exercises: Seated   Elevation  AROM;10 reps    Extension  AROM;10 reps    Row  AROM;10 reps    Protraction  AAROM;10 reps    Flexion  AAROM;10 reps    Other Seated Exercises  scapular depression, A/ROM 10X      Manual Therapy   Manual Therapy  Myofascial release    Manual therapy comments  completed separately from therapeutic exercises    Myofascial Release  myofascial release to left upper arm, trapezius, and scapular regions to decrease pain and fascial restrictions and increase joint ROM               OT Short Term Goals - 11/16/19 1643      OT SHORT TERM GOAL #1   Title  Pt will be provided with and educated on  HEP to improve use of LUE during ADL completion.    Time  4    Period  Weeks    Status  On-going    Target Date  12/10/19      OT SHORT TERM GOAL #2   Title  Pt will increase P/ROM of LUE to [facility] to improve ability to use LUE as assist when completing dressing tasks.    Time  4    Period  Weeks    Status  On-going      OT SHORT TERM GOAL #3   Title  Pt will improve LUE strength to 3+/5 to increase ability to use LUE for overhead reaching tasks.    Time  4    Period  Weeks    Status  On-going        OT Long Term Goals - 11/16/19 1643      OT LONG TERM GOAL #1   Title  Pt will return to highest level of functioning and independence in ADL completion using LUE as non-dominant.    Time  8    Period  Weeks    Status  On-going      OT LONG TERM GOAL #2   Title  Pt will decrease pain in LUE to 3/10 or less to improve ability to sleep for 4 consecutive hours or more at night.    Time  8    Period  Weeks    Status  On-going      OT LONG TERM GOAL #3   Title  Pt will increase LUE A/ROM to [facility] to improve ability to reach over  head and behind back during dressing and bathing tasks.    Time  8    Period  Weeks    Status  On-going      OT LONG TERM GOAL #4   Title  Pt will decrease LUE fascial restrictions to minimal amounts or less to improve mobility required for functional reaching tasks.    Time  8    Period  Weeks    Status  On-going      OT LONG TERM GOAL #5   Title  Pt will increase LUE strength to 4+/5 or greater to increase ability to hold up cameras during photography.    Time  8    Period  Weeks    Status  On-going            Plan - 11/22/19 1646    Clinical Impression Statement  A: Continued with manual therapy to address fascial restrictions in LUE. Pt requiring less cuing to relax arm during passive stretching today. Completed AA/ROM supine and began to progress to sitting, pt with approximately 50% ROM at this time with exception of er which is very limited, <25% ROM. Verbal, tactile, and visual cuing for correct form and technique today.    Body Structure / Function / Physical Skills  ADL;UE functional use;Fascial restriction;Endurance;Pain;ROM;IADL;Strength    Plan  P: add thumb tacks, pulleys, continue working to improve form during exercises       Patient will benefit from skilled therapeutic intervention in order to improve the following deficits and impairments:   Body Structure / Function / Physical Skills: ADL, UE functional use, Fascial restriction, Endurance, Pain, ROM, IADL, Strength       Visit Diagnosis: Stiffness of left shoulder, not elsewhere classified  Acute pain of left shoulder  Other symptoms and signs involving the musculoskeletal system    Problem List  Patient Active Problem List   Diagnosis Date Noted  . 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 Long term (current) use of anticoagulants 03/14/2011  . HYPOTHYROIDISM 12/26/2009  . HYPERLIPIDEMIA 12/26/2009  . ATRIAL FIBRILLATION 12/26/2009  . SHOULDER PAIN, LEFT 12/26/2009   William Sandoval, OTR/L  [phone] 11/22/2019, 4:52 PM  [place] [address] [place], [place], [address] Phone: [phone]   Fax:  [phone]  Name: William Sandoval MRN: [id number] Date of Birth: 21-Mar-1946

## 2019-11-23 ENCOUNTER — Ambulatory Visit (HOSPITAL_COMMUNITY): Payer: Medicare HMO | Admitting: Occupational Therapy

## 2019-11-23 ENCOUNTER — Encounter (HOSPITAL_COMMUNITY): Payer: Self-pay | Admitting: Occupational Therapy

## 2019-11-23 DIAGNOSIS — M25512 Pain in left shoulder: Secondary | ICD-10-CM | POA: Diagnosis not present

## 2019-11-23 DIAGNOSIS — R29898 Other symptoms and signs involving the musculoskeletal system: Secondary | ICD-10-CM | POA: Diagnosis not present

## 2019-11-23 DIAGNOSIS — M25612 Stiffness of left shoulder, not elsewhere classified: Secondary | ICD-10-CM | POA: Diagnosis not present

## 2019-11-23 NOTE — Therapy (Signed)
[place], [place], [address] Phone: [phone]   Fax:  [phone]  Occupational Therapy Treatment  Patient Details  Name: William Sandoval MRN: [id number] Date of Birth: 1946-01-04 Referring Provider (OT): Dr. Justice Britain   Encounter Date: 11/23/2019  OT End of Session - 11/23/19 1627    Visit Number  5    Number of Visits  16    Date for OT Re-Evaluation  01/09/20   mini-reassessment 12/08/2019   Authorization Type  [employer]; $40 copay, no visit limit    Authorization Time Period  progress note by 10th visit    Authorization - Visit Number  5    Authorization - Number of Visits  10    OT Start Time  3734    OT Stop Time  1628    OT Time Calculation (min)  45 min    Activity Tolerance  Patient tolerated treatment well    Behavior During Therapy  [facility] for tasks assessed/performed       Past Medical History:  Diagnosis Date  . Arthritis    shoulder,  . Chronic atrial fibrillation ([place])   . Dyslipidemia   . Dysrhythmia    AFib  . Epistaxis   . Hypertension     Past Surgical History:  Procedure Laterality Date  . REVERSE SHOULDER ARTHROPLASTY Left 09/16/2019   Procedure: REVERSE SHOULDER ARTHROPLASTY;  Surgeon: Justice Britain, MD;  Location: [facility];  Service: Orthopedics;  Laterality: Left;  195mn  . SHOULDER SURGERY     Right    There were no vitals filed for this visit.  Subjective Assessment - 11/23/19 1537    Subjective   S: It seems to be getting better with sleeping on it.    Currently in Pain?  Yes    Pain Score  2     Pain Location  Shoulder    Pain Orientation  Left    Pain Descriptors / Indicators  Sore    Pain Type  Acute pain    Pain Radiating Towards  n/a    Pain Onset  In the past 7 days    Pain Frequency  Constant    Aggravating Factors   movement    Pain Relieving Factors  tylenol or Rx pain medication    Effect of Pain on Daily Activities  max effect on ADLs    Multiple Pain Sites  No         [facility] OT Assessment - 11/23/19 1536      Assessment   Medical Diagnosis  s/p left reverse TSA      Precautions   Precautions  Shoulder    Type of Shoulder Precautions  P/ROM, AA/ROM, A/ROM and progress as tolerated               OT Treatments/Exercises (OP) - 11/23/19 1537      Exercises   Exercises  Shoulder      Shoulder Exercises: Supine   Protraction  PROM;AAROM;10 reps    Horizontal ABduction  PROM;AAROM;10 reps    External Rotation  PROM;AAROM;10 reps    Internal Rotation  PROM;AAROM;10 reps    Flexion  PROM;AAROM;10 reps    ABduction  PROM;AAROM;10 reps      Shoulder Exercises: Seated   Elevation  AROM;10 reps    Extension  AROM;10 reps    Row  AROM;10 reps    Other Seated Exercises  scapular depression, A/ROM 10X  Shoulder Exercises: Pulleys   Flexion  1 minute    Scaption  1 minute      Shoulder Exercises: Therapy Ball   Flexion  15 reps    ABduction  15 reps      Shoulder Exercises: ROM/Strengthening   Thumb Tacks  1' low level    Prot/Ret//Elev/Dep  1' low level      Manual Therapy   Manual Therapy  Myofascial release    Manual therapy comments  completed separately from therapeutic exercises    Myofascial Release  myofascial release to left upper arm, trapezius, and scapular regions to decrease pain and fascial restrictions and increase joint ROM             OT Education - 11/23/19 1605    Education Details  supine AA/ROM    Person(s) Educated  Patient    Methods  Explanation;Demonstration;Handout    Comprehension  Verbalized understanding;Returned demonstration       OT Short Term Goals - 11/16/19 1643      OT SHORT TERM GOAL #1   Title  Pt will be provided with and educated on HEP to improve use of LUE during ADL completion.    Time  4    Period  Weeks    Status  On-going    Target Date  12/10/19      OT SHORT TERM GOAL #2   Title  Pt will increase P/ROM of LUE to [facility] to improve  ability to use LUE as assist when completing dressing tasks.    Time  4    Period  Weeks    Status  On-going      OT SHORT TERM GOAL #3   Title  Pt will improve LUE strength to 3+/5 to increase ability to use LUE for overhead reaching tasks.    Time  4    Period  Weeks    Status  On-going        OT Long Term Goals - 11/16/19 1643      OT LONG TERM GOAL #1   Title  Pt will return to highest level of functioning and independence in ADL completion using LUE as non-dominant.    Time  8    Period  Weeks    Status  On-going      OT LONG TERM GOAL #2   Title  Pt will decrease pain in LUE to 3/10 or less to improve ability to sleep for 4 consecutive hours or more at night.    Time  8    Period  Weeks    Status  On-going      OT LONG TERM GOAL #3   Title  Pt will increase LUE A/ROM to [facility] to improve ability to reach over head and behind back during dressing and bathing tasks.    Time  8    Period  Weeks    Status  On-going      OT LONG TERM GOAL #4   Title  Pt will decrease LUE fascial restrictions to minimal amounts or less to improve mobility required for functional reaching tasks.    Time  8    Period  Weeks    Status  On-going      OT LONG TERM GOAL #5   Title  Pt will increase LUE strength to 4+/5 or greater to increase ability to hold up cameras during photography.    Time  8    Period  Weeks  Status  On-going            Plan - 11/23/19 1610    Clinical Impression Statement  A: Continued with manual therapy to address fascial restrictions in LUE. Continued with passive stretching and AA/ROM supine, updated HEP for supine AA/ROM. Pt requiring less cuing for scapular A/ROM exercises today. Resumed thumb tacks at low level. Added pulleys and pro/ret/elev/dep. Verbal and tactile cuing for form and technique during exercises.    Body Structure / Function / Physical Skills  ADL;UE functional use;Fascial restriction;Endurance;Pain;ROM;IADL;Strength    Plan  P: Follow  up on HEP. Begin AA/ROM in sitting, continue to work on improving ROM available during passive stretching and improving independence in form.       Patient will benefit from skilled therapeutic intervention in order to improve the following deficits and impairments:   Body Structure / Function / Physical Skills: ADL, UE functional use, Fascial restriction, Endurance, Pain, ROM, IADL, Strength       Visit Diagnosis: Stiffness of left shoulder, not elsewhere classified  Acute pain of left shoulder  Other symptoms and signs involving the musculoskeletal system    Problem List Patient Active Problem List   Diagnosis Date Noted  . 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 Long term (current) use of anticoagulants 03/14/2011  . HYPOTHYROIDISM 12/26/2009  . HYPERLIPIDEMIA 12/26/2009  . ATRIAL FIBRILLATION 12/26/2009  . SHOULDER PAIN, LEFT 12/26/2009   Guadelupe Sabin, OTR/L  [phone] 11/23/2019, 4:31 PM  [place] [address] [place], [place], [address] Phone: [phone]   Fax:  [phone]  Name: CARMERON HEADY MRN: [id number] Date of Birth: 12-11-1945

## 2019-11-23 NOTE — Patient Instructions (Signed)
Perform each exercise __10______ reps. 2-3x days while *LYING ON YOUR BACK*   1) Protraction   Start by holding a wand or cane at chest height.  Next, slowly push the wand outwards in front of your body so that your elbows become fully straightened. Then, return to the original position.     2) Shoulder FLEXION   In the standing position, hold a wand/cane with both arms, palms down on both sides. Raise up the wand/cane allowing your unaffected arm to perform most of the effort. Your affected arm should be partially relaxed.      3) Internal/External ROTATION   In the standing position, hold a wand/cane with both hands keeping your elbows bent. Move your arms and wand/cane to one side.  Your affected arm should be partially relaxed while your unaffected arm performs most of the effort.       4) Shoulder ABDUCTION  While holding a wand/cane palm face up on the injured side and palm face down on the uninjured side, slowly raise up your injured arm to the side.                     6) Horizontal Abduction/Adduction      Straight arms holding cane at shoulder height, bring cane to right, center, left. Repeat starting to left.   Copyright  VHI. All rights reserved.

## 2019-11-29 DIAGNOSIS — Z7901 Long term (current) use of anticoagulants: Secondary | ICD-10-CM | POA: Diagnosis not present

## 2019-11-29 DIAGNOSIS — I482 Chronic atrial fibrillation, unspecified: Secondary | ICD-10-CM | POA: Diagnosis not present

## 2019-11-30 ENCOUNTER — Ambulatory Visit (HOSPITAL_COMMUNITY): Payer: Medicare HMO

## 2019-11-30 ENCOUNTER — Other Ambulatory Visit: Payer: Self-pay

## 2019-11-30 ENCOUNTER — Encounter (HOSPITAL_COMMUNITY): Payer: Self-pay

## 2019-11-30 DIAGNOSIS — M25612 Stiffness of left shoulder, not elsewhere classified: Secondary | ICD-10-CM | POA: Diagnosis not present

## 2019-11-30 DIAGNOSIS — M25512 Pain in left shoulder: Secondary | ICD-10-CM

## 2019-11-30 DIAGNOSIS — R29898 Other symptoms and signs involving the musculoskeletal system: Secondary | ICD-10-CM

## 2019-11-30 NOTE — Therapy (Signed)
[place], [place], [address] Phone: [phone]   Fax:  [phone]  Occupational Therapy Treatment  Patient Details  Name: William Sandoval MRN: [id number] Date of Birth: 01-Jan-1946 Referring Provider (OT): Dr. Justice Britain   Encounter Date: 11/30/2019  OT End of Session - 11/30/19 1528    Visit Number  6    Number of Visits  16    Date for OT Re-Evaluation  01/09/20   mini-reassessment 12/08/2019   Authorization Type  [employer]; $40 copay, no visit limit    Authorization Time Period  progress note by 10th visit    Authorization - Visit Number  6    Authorization - Number of Visits  10    OT Start Time  [id number]    OT Stop Time  1512    OT Time Calculation (min)  38 min    Activity Tolerance  Patient tolerated treatment well    Behavior During Therapy  [facility] for tasks assessed/performed       Past Medical History:  Diagnosis Date  . Arthritis    shoulder,  . Chronic atrial fibrillation ([place])   . Dyslipidemia   . Dysrhythmia    AFib  . Epistaxis   . Hypertension     Past Surgical History:  Procedure Laterality Date  . REVERSE SHOULDER ARTHROPLASTY Left 09/16/2019   Procedure: REVERSE SHOULDER ARTHROPLASTY;  Surgeon: Justice Britain, MD;  Location: [facility];  Service: Orthopedics;  Laterality: Left;  129min  . SHOULDER SURGERY     Right    There were no vitals filed for this visit.  Subjective Assessment - 11/30/19 1502    Currently in Pain?  Yes    Pain Score  4     Pain Location  Shoulder    Pain Orientation  Left    Pain Descriptors / Indicators  Aching;Constant;Sore    Pain Type  Acute pain    Pain Radiating Towards  N/A (entire shoulder region)    Pain Onset  1 to 4 weeks ago    Pain Frequency  Constant    Aggravating Factors   movement, use, and attempting to complete HEP    Pain Relieving Factors  Tylenol or pain medication    Effect of Pain on Daily Activities  max-severe effect     Multiple Pain Sites  No         [facility] OT Assessment - 11/30/19 1504      Assessment   Medical Diagnosis  s/p left reverse TSA      Precautions   Precautions  Shoulder    Type of Shoulder Precautions  P/ROM, AA/ROM, A/ROM and progress as tolerated               OT Treatments/Exercises (OP) - 11/30/19 1504      Exercises   Exercises  Shoulder      Shoulder Exercises: Supine   Protraction  PROM;AAROM;10 reps    Horizontal ABduction  PROM;AAROM;10 reps    External Rotation  PROM;10 reps    Internal Rotation  PROM;10 reps    Flexion  PROM;AAROM;10 reps    ABduction  PROM;10 reps      Shoulder Exercises: Standing   Protraction  AAROM;10 reps    Flexion  AAROM;10 reps      Manual Therapy   Manual Therapy  Myofascial release    Manual therapy comments  completed separately from therapeutic exercises    Myofascial Release  myofascial release to left upper arm, trapezius, and scapular regions to decrease pain and fascial restrictions and increase joint ROM             OT Education - 11/30/19 1526    Education Details  Educated to complete his A/AROM exercises standing or sitting at home with standing preferred overall. Provided education throughout session regarding breathing through movements and refraining from holding his breathe.    Person(s) Educated  Patient    Methods  Explanation;Demonstration;Verbal cues    Comprehension  Verbalized understanding;Returned demonstration       OT Short Term Goals - 11/16/19 1643      OT SHORT TERM GOAL #1   Title  Pt will be provided with and educated on HEP to improve use of LUE during ADL completion.    Time  4    Period  Weeks    Status  On-going    Target Date  12/10/19      OT SHORT TERM GOAL #2   Title  Pt will increase P/ROM of LUE to [facility] to improve ability to use LUE as assist when completing dressing tasks.    Time  4    Period  Weeks    Status  On-going      OT SHORT TERM GOAL #3   Title  Pt will  improve LUE strength to 3+/5 to increase ability to use LUE for overhead reaching tasks.    Time  4    Period  Weeks    Status  On-going        OT Long Term Goals - 11/16/19 1643      OT LONG TERM GOAL #1   Title  Pt will return to highest level of functioning and independence in ADL completion using LUE as non-dominant.    Time  8    Period  Weeks    Status  On-going      OT LONG TERM GOAL #2   Title  Pt will decrease pain in LUE to 3/10 or less to improve ability to sleep for 4 consecutive hours or more at night.    Time  8    Period  Weeks    Status  On-going      OT LONG TERM GOAL #3   Title  Pt will increase LUE A/ROM to [facility] to improve ability to reach over head and behind back during dressing and bathing tasks.    Time  8    Period  Weeks    Status  On-going      OT LONG TERM GOAL #4   Title  Pt will decrease LUE fascial restrictions to minimal amounts or less to improve mobility required for functional reaching tasks.    Time  8    Period  Weeks    Status  On-going      OT LONG TERM GOAL #5   Title  Pt will increase LUE strength to 4+/5 or greater to increase ability to hold up cameras during photography.    Time  8    Period  Weeks    Status  On-going            Plan - 11/30/19 1529    Clinical Impression Statement  A: Continued to focus on increasing ROM and tolerance during functional activties. Education completed thoroughout session for form and technique. Manual techniques completed to address fascial restrctions in LUE.    Body Structure / Function / Physical Skills  ADL;UE functional use;Fascial restriction;Endurance;Pain;ROM;IADL;Strength    Plan  P: Follow up on completing HEP while standing. Eliminate supine/reclined AA/ROM and complete standing instead. PVC pipe slide.    Consulted and Agree with Plan of Care  Patient       Patient will benefit from skilled therapeutic intervention in order to improve the following deficits and impairments:    Body Structure / Function / Physical Skills: ADL, UE functional use, Fascial restriction, Endurance, Pain, ROM, IADL, Strength       Visit Diagnosis: Other symptoms and signs involving the musculoskeletal system  Stiffness of left shoulder, not elsewhere classified  Acute pain of left shoulder    Problem List Patient Active Problem List   Diagnosis Date Noted  . 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 Long term (current) use of anticoagulants 03/14/2011  . HYPOTHYROIDISM 12/26/2009  . HYPERLIPIDEMIA 12/26/2009  . ATRIAL FIBRILLATION 12/26/2009  . SHOULDER PAIN, LEFT 12/26/2009   Ailene Ravel, OTR/L,CBIS  [phone]  11/30/2019, 3:31 PM  [facility] [address] [place], [place], [address] Phone: [phone]   Fax:  [phone]  Name: William Sandoval MRN: [id number] Date of Birth: 31-May-1946

## 2019-12-02 ENCOUNTER — Ambulatory Visit (HOSPITAL_COMMUNITY): Payer: Medicare HMO

## 2019-12-02 ENCOUNTER — Other Ambulatory Visit: Payer: Self-pay

## 2019-12-02 ENCOUNTER — Encounter (HOSPITAL_COMMUNITY): Payer: Self-pay

## 2019-12-02 DIAGNOSIS — M25612 Stiffness of left shoulder, not elsewhere classified: Secondary | ICD-10-CM | POA: Diagnosis not present

## 2019-12-02 DIAGNOSIS — R29898 Other symptoms and signs involving the musculoskeletal system: Secondary | ICD-10-CM

## 2019-12-02 DIAGNOSIS — M25512 Pain in left shoulder: Secondary | ICD-10-CM

## 2019-12-02 NOTE — Therapy (Signed)
[place], [place], [address] Phone: [phone]   Fax:  [phone]  Occupational Therapy Treatment  Patient Details  Name: William Sandoval MRN: [id number] Date of Birth: 01-Jun-1946 Referring Provider (OT): Dr. Justice Britain   Encounter Date: 12/02/2019  OT End of Session - 12/02/19 1424    Visit Number  7    Number of Visits  16    Date for OT Re-Evaluation  01/09/20   mini-reassessment 12/08/2019   Authorization Type  [employer]; $40 copay, no visit limit    Authorization Time Period  progress note by 10th visit    Authorization - Visit Number  7    Authorization - Number of Visits  10    OT Start Time  [id number]    OT Stop Time  1425    OT Time Calculation (min)  38 min    Activity Tolerance  Patient tolerated treatment well    Behavior During Therapy  [facility] for tasks assessed/performed       Past Medical History:  Diagnosis Date  . Arthritis    shoulder,  . Chronic atrial fibrillation ([place])   . Dyslipidemia   . Dysrhythmia    AFib  . Epistaxis   . Hypertension     Past Surgical History:  Procedure Laterality Date  . REVERSE SHOULDER ARTHROPLASTY Left 09/16/2019   Procedure: REVERSE SHOULDER ARTHROPLASTY;  Surgeon: Justice Britain, MD;  Location: [facility];  Service: Orthopedics;  Laterality: Left;  128min  . SHOULDER SURGERY     Right    There were no vitals filed for this visit.  Subjective Assessment - 12/02/19 1410    Subjective   S: I was able to do some of those exercises standing up.    Currently in Pain?  Yes    Pain Score  4     Pain Location  Shoulder    Pain Orientation  Left    Pain Descriptors / Indicators  Aching;Constant    Pain Type  Acute pain         [facility] OT Assessment - 12/02/19 1410      Assessment   Medical Diagnosis  s/p left reverse TSA      Precautions   Precautions  Shoulder    Type of Shoulder Precautions  P/ROM, AA/ROM, A/ROM and progress as tolerated                OT Treatments/Exercises (OP) - 12/02/19 1411      Exercises   Exercises  Shoulder      Shoulder Exercises: Supine   Protraction  PROM;10 reps    Horizontal ABduction  PROM;10 reps    External Rotation  PROM;10 reps    Internal Rotation  PROM;10 reps    Flexion  PROM;10 reps    ABduction  PROM;10 reps      Shoulder Exercises: Standing   Protraction  AAROM;10 reps    Horizontal ABduction  AAROM;10 reps    External Rotation  AAROM;10 reps    Internal Rotation  AAROM;10 reps    Flexion  AAROM;10 reps    ABduction  AAROM;10 reps      Shoulder Exercises: ROM/Strengthening   Other ROM/Strengthening Exercises  PVC pipe slide; 10X      Functional Reaching Activities   Mid Level  Functional reaching task completed while standing and placing 10 cones on the second shelf. Cones removed in same fashion.       Manual  Therapy   Manual Therapy  Myofascial release    Manual therapy comments  completed separately from therapeutic exercises    Myofascial Release  myofascial release to left upper arm, trapezius, and scapular regions to decrease pain and fascial restrictions and increase joint ROM               OT Short Term Goals - 11/16/19 1643      OT SHORT TERM GOAL #1   Title  Pt will be provided with and educated on HEP to improve use of LUE during ADL completion.    Time  4    Period  Weeks    Status  On-going    Target Date  12/10/19      OT SHORT TERM GOAL #2   Title  Pt will increase P/ROM of LUE to [facility] to improve ability to use LUE as assist when completing dressing tasks.    Time  4    Period  Weeks    Status  On-going      OT SHORT TERM GOAL #3   Title  Pt will improve LUE strength to 3+/5 to increase ability to use LUE for overhead reaching tasks.    Time  4    Period  Weeks    Status  On-going        OT Long Term Goals - 11/16/19 1643      OT LONG TERM GOAL #1   Title  Pt will return to highest level of functioning and independence  in ADL completion using LUE as non-dominant.    Time  8    Period  Weeks    Status  On-going      OT LONG TERM GOAL #2   Title  Pt will decrease pain in LUE to 3/10 or less to improve ability to sleep for 4 consecutive hours or more at night.    Time  8    Period  Weeks    Status  On-going      OT LONG TERM GOAL #3   Title  Pt will increase LUE A/ROM to [facility] to improve ability to reach over head and behind back during dressing and bathing tasks.    Time  8    Period  Weeks    Status  On-going      OT LONG TERM GOAL #4   Title  Pt will decrease LUE fascial restrictions to minimal amounts or less to improve mobility required for functional reaching tasks.    Time  8    Period  Weeks    Status  On-going      OT LONG TERM GOAL #5   Title  Pt will increase LUE strength to 4+/5 or greater to increase ability to hold up cameras during photography.    Time  8    Period  Weeks    Status  On-going            Plan - 12/02/19 1440    Clinical Impression Statement  A: Pt states that he used his old shaver [employer]) as a Production assistant, radio and he thinks it helps. He's liked to use that like that with enough vibration to help. Focused on functional reaching and use of LUE during session. Education provded throughout session for form and technique. Manual techniques completed to address fascial restrictions.    Body Structure / Function / Physical Skills  ADL;UE functional use;Fascial restriction;Endurance;Pain;ROM;IADL;Strength    Plan  P: Continue with AA/ROM. Use  Squigz on door for functional reaching task.    Consulted and Agree with Plan of Care  Patient       Patient will benefit from skilled therapeutic intervention in order to improve the following deficits and impairments:   Body Structure / Function / Physical Skills: ADL, UE functional use, Fascial restriction, Endurance, Pain, ROM, IADL, Strength       Visit Diagnosis: Acute pain of left shoulder  Stiffness of left shoulder,  not elsewhere classified  Other symptoms and signs involving the musculoskeletal system    Problem List Patient Active Problem List   Diagnosis Date Noted  . 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 Long term (current) use of anticoagulants 03/14/2011  . HYPOTHYROIDISM 12/26/2009  . HYPERLIPIDEMIA 12/26/2009  . ATRIAL FIBRILLATION 12/26/2009  . SHOULDER PAIN, LEFT 12/26/2009   Ailene Ravel, OTR/L,CBIS  [phone]  12/02/2019, 2:45 PM  [facility] [address] [place], [place], [address] Phone: [phone]   Fax:  [phone]  Name: William Sandoval MRN: [id number] Date of Birth: 1946-07-09

## 2019-12-06 ENCOUNTER — Other Ambulatory Visit: Payer: Self-pay

## 2019-12-06 ENCOUNTER — Ambulatory Visit (HOSPITAL_COMMUNITY): Payer: Medicare HMO | Attending: Orthopedic Surgery

## 2019-12-06 ENCOUNTER — Encounter (HOSPITAL_COMMUNITY): Payer: Self-pay

## 2019-12-06 DIAGNOSIS — M25612 Stiffness of left shoulder, not elsewhere classified: Secondary | ICD-10-CM | POA: Diagnosis not present

## 2019-12-06 DIAGNOSIS — R29898 Other symptoms and signs involving the musculoskeletal system: Secondary | ICD-10-CM

## 2019-12-06 DIAGNOSIS — M25512 Pain in left shoulder: Secondary | ICD-10-CM | POA: Diagnosis not present

## 2019-12-06 NOTE — Therapy (Signed)
[place], [place], [address] Phone: [phone]   Fax:  [phone]  Occupational Therapy Treatment  Patient Details  Name: William Sandoval MRN: [id number] Date of Birth: 08/11/1946 Referring Provider (OT): Dr. Justice Britain   Encounter Date: 12/06/2019  OT End of Session - 12/06/19 1420    Visit Number  8    Number of Visits  16    Date for OT Re-Evaluation  01/09/20   mini-reassessment 12/08/2019   Authorization Type  [employer]; $40 copay, no visit limit    Authorization Time Period  progress note by 10th visit    Authorization - Visit Number  8    Authorization - Number of Visits  10    OT Start Time  [id number]    OT Stop Time  1428    OT Time Calculation (min)  41 min    Activity Tolerance  Patient tolerated treatment well    Behavior During Therapy  [facility] for tasks assessed/performed       Past Medical History:  Diagnosis Date  . Arthritis    shoulder,  . Chronic atrial fibrillation ([place])   . Dyslipidemia   . Dysrhythmia    AFib  . Epistaxis   . Hypertension     Past Surgical History:  Procedure Laterality Date  . REVERSE SHOULDER ARTHROPLASTY Left 09/16/2019   Procedure: REVERSE SHOULDER ARTHROPLASTY;  Surgeon: Justice Britain, MD;  Location: [facility];  Service: Orthopedics;  Laterality: Left;  118min  . SHOULDER SURGERY     Right    There were no vitals filed for this visit.  Subjective Assessment - 12/06/19 1406    Subjective   S: I tried to recreate this at home with a pole but I didn't have anything that would work (PVC Pipe slide).    Currently in Pain?  Yes    Pain Score  4     Pain Location  Shoulder    Pain Orientation  Left    Pain Descriptors / Indicators  Aching;Constant    Pain Type  Acute pain         [facility] OT Assessment - 12/06/19 1407      Assessment   Medical Diagnosis  s/p left reverse TSA      Precautions   Precautions  Shoulder    Type of Shoulder Precautions   P/ROM, AA/ROM, A/ROM and progress as tolerated               OT Treatments/Exercises (OP) - 12/06/19 1408      Exercises   Exercises  Shoulder      Shoulder Exercises: Supine   Protraction  PROM;10 reps    Horizontal ABduction  PROM;10 reps    External Rotation  PROM;10 reps    Internal Rotation  PROM;10 reps    Flexion  PROM;10 reps    ABduction  PROM;10 reps      Shoulder Exercises: Pulleys   Flexion  1 minute    Scaption  --      Shoulder Exercises: ROM/Strengthening   UBE (Upper Arm Bike)  level 1 1' forward 1' reverse   pace: 6.0-70   Wall Wash  1'      Functional Reaching Activities   Mid Level  pt placed squigz on the door at the highest level possible. Removed in same fashion.      Manual Therapy   Manual Therapy  Myofascial release    Manual therapy comments  completed separately from therapeutic exercises    Myofascial Release  myofascial release to left upper arm, trapezius, and scapular regions to decrease pain and fascial restrictions and increase joint ROM               OT Short Term Goals - 11/16/19 1643      OT SHORT TERM GOAL #1   Title  Pt will be provided with and educated on HEP to improve use of LUE during ADL completion.    Time  4    Period  Weeks    Status  On-going    Target Date  12/10/19      OT SHORT TERM GOAL #2   Title  Pt will increase P/ROM of LUE to [facility] to improve ability to use LUE as assist when completing dressing tasks.    Time  4    Period  Weeks    Status  On-going      OT SHORT TERM GOAL #3   Title  Pt will improve LUE strength to 3+/5 to increase ability to use LUE for overhead reaching tasks.    Time  4    Period  Weeks    Status  On-going        OT Long Term Goals - 11/16/19 1643      OT LONG TERM GOAL #1   Title  Pt will return to highest level of functioning and independence in ADL completion using LUE as non-dominant.    Time  8    Period  Weeks    Status  On-going      OT LONG TERM GOAL  #2   Title  Pt will decrease pain in LUE to 3/10 or less to improve ability to sleep for 4 consecutive hours or more at night.    Time  8    Period  Weeks    Status  On-going      OT LONG TERM GOAL #3   Title  Pt will increase LUE A/ROM to [facility] to improve ability to reach over head and behind back during dressing and bathing tasks.    Time  8    Period  Weeks    Status  On-going      OT LONG TERM GOAL #4   Title  Pt will decrease LUE fascial restrictions to minimal amounts or less to improve mobility required for functional reaching tasks.    Time  8    Period  Weeks    Status  On-going      OT LONG TERM GOAL #5   Title  Pt will increase LUE strength to 4+/5 or greater to increase ability to hold up cameras during photography.    Time  8    Period  Weeks    Status  On-going            Plan - 12/06/19 1602    Clinical Impression Statement  A: Focused on functional reaching tasks during session. Patient demonstrated functional reaching ability at shoulder level or slightly higher. Did take occassional rest breaks for muscle fatigue and pain although was able to complete all required exercises. Manual techniques completed to address fascial restrictions.    Body Structure / Function / Physical Skills  ADL;UE functional use;Fascial restriction;Endurance;Pain;ROM;IADL;Strength    Plan  P: Measurements for MD appointment/mini reassess. Continue with functional reaching tasks. omit any supine/reclined exercises and focus on standing positions.    Consulted and Agree with Plan of Care  Patient       Patient will benefit from skilled therapeutic intervention in order to improve the following deficits and impairments:   Body Structure / Function / Physical Skills: ADL, UE functional use, Fascial restriction, Endurance, Pain, ROM, IADL, Strength       Visit Diagnosis: Acute pain of left shoulder  Stiffness of left shoulder, not elsewhere classified  Other symptoms and signs  involving the musculoskeletal system    Problem List Patient Active Problem List   Diagnosis Date Noted  . 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 Long term (current) use of anticoagulants 03/14/2011  . HYPOTHYROIDISM 12/26/2009  . HYPERLIPIDEMIA 12/26/2009  . ATRIAL FIBRILLATION 12/26/2009  . SHOULDER PAIN, LEFT 12/26/2009   Ailene Ravel, OTR/L,CBIS  [phone]  12/06/2019, 5:27 PM  [place] [address] [place], [place], [address] Phone: [phone]   Fax:  [phone]  Name: William Sandoval MRN: [id number] Date of Birth: 1946-04-09

## 2019-12-07 ENCOUNTER — Other Ambulatory Visit: Payer: Self-pay

## 2019-12-07 NOTE — Patient Outreach (Signed)
[place] [facility]) Care Management  12/07/2019  William Sandoval [birth year] [id number]   Telephone Assessment   Outreach attempt #1 to patient. Spoke with patient who denies any acute issues or concerns at present. He voices that he has started outpatient rehab for his shoulder. He is making progress and shares that his "muscles are getting worked." He states that he has noticed that when the weather is cold or rainy he has some minor pain/soreness. He continues to take OTC Tylenol prn and states pain med is effective in relieving pain. He shares that he received notification that he was over the income limit for Medicaid assistance. Patient continues to voice that he is getting by and making ends meet at present. He denies any RN CM needs or concerns at present.    THN CM Care Plan Problem One     Most Recent Value  Care Plan Problem One  Patient at risk for hospital readmission due to co-morbidities.  Role Documenting the Problem One  Care Management Telephonic [place] for Problem One  Active  [facility] Long Term Goal   Patient will report completing outpatient rehab over the next 90days.  THN Long Term Goal Start Date  12/07/19  Interventions for Problem One Long Term Goal  RNCM discussed and assessed patient's progress with therapy. RN CM assessed for any barriers.   THN CM Short Term Goal #1   Patient will report no falls/injuries within the next 30 days.  THN CM Short Term Goal #1 Start Date  12/07/19  Interventions for Short Term Goal #1  RN CM assessed for falls. RN CM reviewed fall/safety measures.   THN CM Short Term Goal #3  Patient will report actively particpating in outpatient rehab over the next 30 days.  THN CM Short Term Goal #3 Start Date  11/02/19  THN CM Short Term Goal #3 Met Date  12/07/19  THN CM Short Term Goal #4  Patient will report a tolerable level of pain(3 or less) over the next 30 days.  THN CM Short Term Goal #4 Start Date  11/02/19  [facility] CM  Short Term Goal #4 Met Date  12/07/19  Interventions for Short Term Goal #4  RN CM assessed for pain. RN CM reviewed pain mgmt measures with pt.       Plan: RN CM discussed with patient next outreach within a month. Patient gave verbal consent and in agreement with RN CM follow up and timeframe. Patient aware that they may contact RN CM sooner for any issues or concerns. RN CM will send quarterly update to PCP.   [name], RN,BSN,CCM [place] Management Telephonic Care Management Coordinator Direct Phone: [phone] Toll Free: [phone] Fax: [phone]

## 2019-12-08 ENCOUNTER — Encounter (HOSPITAL_COMMUNITY): Payer: Self-pay | Admitting: Occupational Therapy

## 2019-12-08 ENCOUNTER — Other Ambulatory Visit: Payer: Self-pay

## 2019-12-08 ENCOUNTER — Ambulatory Visit (HOSPITAL_COMMUNITY): Payer: Medicare HMO | Admitting: Occupational Therapy

## 2019-12-08 DIAGNOSIS — M25612 Stiffness of left shoulder, not elsewhere classified: Secondary | ICD-10-CM

## 2019-12-08 DIAGNOSIS — M25512 Pain in left shoulder: Secondary | ICD-10-CM

## 2019-12-08 DIAGNOSIS — R29898 Other symptoms and signs involving the musculoskeletal system: Secondary | ICD-10-CM

## 2019-12-08 NOTE — Therapy (Signed)
[place], [place], [address] Phone: [phone]   Fax:  [phone]  Occupational Therapy Treatment (mini-reassessment)  Patient Details  Name: William Sandoval MRN: [id number] Date of Birth: March 04, 1946 Referring Provider (OT): Dr. Justice Britain   Progress Note Reporting Period 11/10/2019 to 12/08/2019  See note below for Objective Data and Assessment of Progress/Goals.       Encounter Date: 12/08/2019  OT End of Session - 12/08/19 1159    Visit Number  9    Number of Visits  16    Date for OT Re-Evaluation  01/09/20   mini-reassessment 12/08/2019   Authorization Type  [employer]; $40 copay, no visit limit    Authorization Time Period  progress note by 19th visit    Authorization - Visit Number  9    Authorization - Number of Visits  19    OT Start Time  1118    OT Stop Time  1157    OT Time Calculation (min)  39 min    Activity Tolerance  Patient tolerated treatment well    Behavior During Therapy  W[facility] for tasks assessed/performed       Past Medical History:  Diagnosis Date  . Arthritis    shoulder,  . Chronic atrial fibrillation ([place])   . Dyslipidemia   . Dysrhythmia    AFib  . Epistaxis   . Hypertension     Past Surgical History:  Procedure Laterality Date  . REVERSE SHOULDER ARTHROPLASTY Left 09/16/2019   Procedure: REVERSE SHOULDER ARTHROPLASTY;  Surgeon: Justice Britain, MD;  Location: [facility];  Service: Orthopedics;  Laterality: Left;  128min  . SHOULDER SURGERY     Right    There were no vitals filed for this visit.  Subjective Assessment - 12/08/19 1120    Subjective   S: It hurts at night when I slide down in the bed.    Currently in Pain?  Yes    Pain Score  1     Pain Location  Shoulder    Pain Orientation  Left    Pain Descriptors / Indicators  Sore    Pain Type  Acute pain    Pain Radiating Towards  N/A    Pain Onset  1 to 4 weeks ago    Pain Frequency  Intermittent     Aggravating Factors   movement, use, attempting to reach for items.    Pain Relieving Factors  tylenol    Effect of Pain on Daily Activities  max effect on ADLs    Multiple Pain Sites  No         [facility] OT Assessment - 12/08/19 1119      Assessment   Medical Diagnosis  s/p left reverse TSA      Precautions   Precautions  Shoulder    Type of Shoulder Precautions  P/ROM, AA/ROM, A/ROM and progress as tolerated      Observation/Other Assessments   Focus on Therapeutic Outcomes (FOTO)   47/100   51/100     Palpation   Palpation comment  min-mod fascial restrictions in upper arm, anterior shoulder, and trapezius regions      AROM   Overall AROM Comments  Assessed seated, er/IR adducted    AROM Assessment Site  Shoulder    Right/Left Shoulder  Left    Left Shoulder Flexion  90 Degrees   78 previous   Left Shoulder ABduction  69 Degrees   61 previous  Left Shoulder Internal Rotation  90 Degrees   same as previous   Left Shoulder External Rotation  6 Degrees   -8 previous     PROM   Overall PROM Comments  Assessed supine, er/IR adducted    PROM Assessment Site  Shoulder    Right/Left Shoulder  Left    Left Shoulder Flexion  95 Degrees   90 previous   Left Shoulder ABduction  80 Degrees   68 previous   Left Shoulder Internal Rotation  90 Degrees   same as previous   Left Shoulder External Rotation  13 Degrees   12 previous     Strength   Overall Strength Comments  Assessed seated, er/IR adducted    Strength Assessment Site  Shoulder    Right/Left Shoulder  Left    Left Shoulder Flexion  3-/5   same as previous   Left Shoulder ABduction  3-/5   same as previous   Left Shoulder Internal Rotation  3/5   same as previous   Left Shoulder External Rotation  3-/5   2+/5 previous              OT Treatments/Exercises (OP) - 12/08/19 1121      Exercises   Exercises  Shoulder      Shoulder Exercises: Standing   Protraction  AAROM;10 reps    Horizontal  ABduction  AAROM;10 reps    External Rotation  AAROM;10 reps    Internal Rotation  AAROM;10 reps    Flexion  AAROM;10 reps    ABduction  AAROM;10 reps      Shoulder Exercises: ROM/Strengthening   Wall Wash  1'      Shoulder Exercises: Stretch   Wall Stretch - Flexion  3 reps;10 seconds    Wall Stretch - ABduction  3 reps;10 seconds    Other Shoulder Stretches  doorway stretch: 3x. 10"      Manual Therapy   Manual Therapy  Myofascial release    Manual therapy comments  completed separately from therapeutic exercises    Myofascial Release  myofascial release to left upper arm, trapezius, and scapular regions to decrease pain and fascial restrictions and increase joint ROM               OT Short Term Goals - 11/16/19 1643      OT SHORT TERM GOAL #1   Title  Pt will be provided with and educated on HEP to improve use of LUE during ADL completion.    Time  4    Period  Weeks    Status  On-going    Target Date  12/10/19      OT SHORT TERM GOAL #2   Title  Pt will increase P/ROM of LUE to [facility] to improve ability to use LUE as assist when completing dressing tasks.    Time  4    Period  Weeks    Status  On-going      OT SHORT TERM GOAL #3   Title  Pt will improve LUE strength to 3+/5 to increase ability to use LUE for overhead reaching tasks.    Time  4    Period  Weeks    Status  On-going        OT Long Term Goals - 11/16/19 1643      OT LONG TERM GOAL #1   Title  Pt will return to highest level of functioning and independence in ADL completion using LUE as non-dominant.  Time  8    Period  Weeks    Status  On-going      OT LONG TERM GOAL #2   Title  Pt will decrease pain in LUE to 3/10 or less to improve ability to sleep for 4 consecutive hours or more at night.    Time  8    Period  Weeks    Status  On-going      OT LONG TERM GOAL #3   Title  Pt will increase LUE A/ROM to [facility] to improve ability to reach over head and behind back during dressing and  bathing tasks.    Time  8    Period  Weeks    Status  On-going      OT LONG TERM GOAL #4   Title  Pt will decrease LUE fascial restrictions to minimal amounts or less to improve mobility required for functional reaching tasks.    Time  8    Period  Weeks    Status  On-going      OT LONG TERM GOAL #5   Title  Pt will increase LUE strength to 4+/5 or greater to increase ability to hold up cameras during photography.    Time  8    Period  Weeks    Status  On-going            Plan - 12/08/19 1148    Clinical Impression Statement  A: Mini-reassessment completed this session, pt is slowly progressing towards goals and improved functioning of LUE. Continued with manual therapy this session. Pt continues to be very guarded therefore no passive stretching completed, transitioned to shoulder stretches at wall-limited to approximately 50% ROM. Continued with AA/ROM in standing, verbal cuing for form and technique.    Body Structure / Function / Physical Skills  ADL;UE functional use;Fascial restriction;Endurance;Pain;ROM;IADL;Strength    Plan  P: Continue with shoulder stretches versus passive stretching, follow up on MD appt. Functional reaching task       Patient will benefit from skilled therapeutic intervention in order to improve the following deficits and impairments:   Body Structure / Function / Physical Skills: ADL, UE functional use, Fascial restriction, Endurance, Pain, ROM, IADL, Strength       Visit Diagnosis: Acute pain of left shoulder  Stiffness of left shoulder, not elsewhere classified  Other symptoms and signs involving the musculoskeletal system    Problem List Patient Active Problem List   Diagnosis Date Noted  . 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 Long term (current) use of anticoagulants 03/14/2011  . HYPOTHYROIDISM 12/26/2009  . HYPERLIPIDEMIA 12/26/2009  . ATRIAL FIBRILLATION 12/26/2009  . SHOULDER PAIN, LEFT 12/26/2009    Guadelupe Sabin, OTR/L  [phone] 12/08/2019, 12:01 PM  [place] [address] [place], [place], [address] Phone: [phone]   Fax:  [phone]  Name: William Sandoval MRN: [id number] Date of Birth: 24-Dec-1945

## 2019-12-13 ENCOUNTER — Other Ambulatory Visit: Payer: Self-pay

## 2019-12-13 ENCOUNTER — Ambulatory Visit (HOSPITAL_COMMUNITY): Payer: Medicare HMO | Admitting: Occupational Therapy

## 2019-12-13 ENCOUNTER — Encounter (HOSPITAL_COMMUNITY): Payer: Self-pay | Admitting: Occupational Therapy

## 2019-12-13 DIAGNOSIS — R29898 Other symptoms and signs involving the musculoskeletal system: Secondary | ICD-10-CM | POA: Diagnosis not present

## 2019-12-13 DIAGNOSIS — M25612 Stiffness of left shoulder, not elsewhere classified: Secondary | ICD-10-CM

## 2019-12-13 DIAGNOSIS — M25512 Pain in left shoulder: Secondary | ICD-10-CM | POA: Diagnosis not present

## 2019-12-13 DIAGNOSIS — Z96612 Presence of left artificial shoulder joint: Secondary | ICD-10-CM | POA: Diagnosis not present

## 2019-12-13 DIAGNOSIS — Z471 Aftercare following joint replacement surgery: Secondary | ICD-10-CM | POA: Diagnosis not present

## 2019-12-13 NOTE — Therapy (Signed)
[place], [place], [address] Phone: [phone]   Fax:  [phone]  Occupational Therapy Treatment  Patient Details  Name: William Sandoval MRN: [id number] Date of Birth: 1946-11-26 Referring Provider (OT): Dr. Justice Britain   Encounter Date: 12/13/2019  OT End of Session - 12/13/19 1339    Visit Number  10    Number of Visits  16    Date for OT Re-Evaluation  01/09/20    Authorization Type  [employer]; $40 copay, no visit limit    Authorization Time Period  progress note by 19th visit    Authorization - Visit Number  10    Authorization - Number of Visits  19    OT Start Time  1301    OT Stop Time  1341    OT Time Calculation (min)  40 min    Activity Tolerance  Patient tolerated treatment well    Behavior During Therapy  [facility] for tasks assessed/performed       Past Medical History:  Diagnosis Date  . Arthritis    shoulder,  . Chronic atrial fibrillation ([place])   . Dyslipidemia   . Dysrhythmia    AFib  . Epistaxis   . Hypertension     Past Surgical History:  Procedure Laterality Date  . REVERSE SHOULDER ARTHROPLASTY Left 09/16/2019   Procedure: REVERSE SHOULDER ARTHROPLASTY;  Surgeon: Justice Britain, MD;  Location: [facility];  Service: Orthopedics;  Laterality: Left;  140min  . SHOULDER SURGERY     Right    There were no vitals filed for this visit.  Subjective Assessment - 12/13/19 1301    Subjective   S: They gave me a pulley system to use at home.    Currently in Pain?  No/denies         [facility] OT Assessment - 12/13/19 1301      Assessment   Medical Diagnosis  s/p left reverse TSA      Precautions   Precautions  Shoulder    Type of Shoulder Precautions  P/ROM, AA/ROM, A/ROM and progress as tolerated               OT Treatments/Exercises (OP) - 12/13/19 1304      Exercises   Exercises  Shoulder      Shoulder Exercises: Standing   Protraction  AAROM;10 reps    Horizontal ABduction  AAROM;10 reps    External Rotation  AAROM;10 reps    Internal Rotation  AAROM;10 reps    Flexion  AAROM;10 reps    ABduction  AAROM;10 reps      Shoulder Exercises: Pulleys   Flexion  2 minutes    ABduction  2 minutes      Shoulder Exercises: ROM/Strengthening   Wall Wash  1'    Other ROM/Strengthening Exercises  --      Shoulder Exercises: Stretch   Wall Stretch - Flexion  3 reps;10 seconds    Wall Stretch - ABduction  3 reps;10 seconds    Other Shoulder Stretches  doorway stretch: 3x. 10"      Manual Therapy   Manual Therapy  Myofascial release    Manual therapy comments  completed separately from therapeutic exercises    Myofascial Release  myofascial release to left upper arm, trapezius, and scapular regions to decrease pain and fascial restrictions and increase joint ROM               OT Short Term Goals -  11/16/19 1643      OT SHORT TERM GOAL #1   Title  Pt will be provided with and educated on HEP to improve use of LUE during ADL completion.    Time  4    Period  Weeks    Status  On-going    Target Date  12/10/19      OT SHORT TERM GOAL #2   Title  Pt will increase P/ROM of LUE to [facility] to improve ability to use LUE as assist when completing dressing tasks.    Time  4    Period  Weeks    Status  On-going      OT SHORT TERM GOAL #3   Title  Pt will improve LUE strength to 3+/5 to increase ability to use LUE for overhead reaching tasks.    Time  4    Period  Weeks    Status  On-going        OT Long Term Goals - 11/16/19 1643      OT LONG TERM GOAL #1   Title  Pt will return to highest level of functioning and independence in ADL completion using LUE as non-dominant.    Time  8    Period  Weeks    Status  On-going      OT LONG TERM GOAL #2   Title  Pt will decrease pain in LUE to 3/10 or less to improve ability to sleep for 4 consecutive hours or more at night.    Time  8    Period  Weeks    Status  On-going      OT LONG  TERM GOAL #3   Title  Pt will increase LUE A/ROM to [facility] to improve ability to reach over head and behind back during dressing and bathing tasks.    Time  8    Period  Weeks    Status  On-going      OT LONG TERM GOAL #4   Title  Pt will decrease LUE fascial restrictions to minimal amounts or less to improve mobility required for functional reaching tasks.    Time  8    Period  Weeks    Status  On-going      OT LONG TERM GOAL #5   Title  Pt will increase LUE strength to 4+/5 or greater to increase ability to hold up cameras during photography.    Time  8    Period  Weeks    Status  On-going            Plan - 12/13/19 1324    Clinical Impression Statement  A: Pt reports the x-ray the MD took look fine, MD is concerned about his muscle tightness and gave pt a pulley system for home use. Pt reports he would like to do his next 2 appointments and then see about holding therapy and working with his pulley at home as the visits are expensive. Pt with improved muscle tightness in upper arm and scapular regions today. Continued with shoulder stretches and AA/ROM limited to 50% ROM. Pt completing pulley activity while seated, 2 minutes each direction. Verbal cuing  for form and technique.    Body Structure / Function / Physical Skills  ADL;UE functional use;Fascial restriction;Endurance;Pain;ROM;IADL;Strength    Plan  P: Follow up on pulleys at home, functional task working on reaching; discuss HEP and update as needed       Patient will benefit from skilled therapeutic intervention  in order to improve the following deficits and impairments:   Body Structure / Function / Physical Skills: ADL, UE functional use, Fascial restriction, Endurance, Pain, ROM, IADL, Strength       Visit Diagnosis: Acute pain of left shoulder  Stiffness of left shoulder, not elsewhere classified  Other symptoms and signs involving the musculoskeletal system    Problem List Patient Active Problem List    Diagnosis Date Noted  . 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 Long term (current) use of anticoagulants 03/14/2011  . HYPOTHYROIDISM 12/26/2009  . HYPERLIPIDEMIA 12/26/2009  . ATRIAL FIBRILLATION 12/26/2009  . SHOULDER PAIN, LEFT 12/26/2009    Guadelupe Sabin, OTR/L  [phone] 12/13/2019, 1:41 PM  [place] [address] [place], [place], [address] Phone: [phone]   Fax:  [phone]  Name: William Sandoval MRN: [id number] Date of Birth: [birth year]

## 2019-12-15 ENCOUNTER — Other Ambulatory Visit: Payer: Self-pay

## 2019-12-15 ENCOUNTER — Ambulatory Visit (HOSPITAL_COMMUNITY): Payer: Medicare HMO | Admitting: Occupational Therapy

## 2019-12-15 DIAGNOSIS — M25612 Stiffness of left shoulder, not elsewhere classified: Secondary | ICD-10-CM

## 2019-12-15 DIAGNOSIS — M25512 Pain in left shoulder: Secondary | ICD-10-CM | POA: Diagnosis not present

## 2019-12-15 DIAGNOSIS — R29898 Other symptoms and signs involving the musculoskeletal system: Secondary | ICD-10-CM | POA: Diagnosis not present

## 2019-12-15 NOTE — Therapy (Signed)
[place], [place], [address] Phone: [phone]   Fax:  [phone]  Occupational Therapy Treatment  Patient Details  Name: William Sandoval MRN: [id number] Date of Birth: Jul 12, 1946 Referring Provider (OT): Dr. Justice Britain   Encounter Date: 12/15/2019  OT End of Session - 12/15/19 1704    Visit Number  11    Number of Visits  16    Date for OT Re-Evaluation  01/09/20    Authorization Type  [employer] Medicare; $40 copay, no visit limit    Authorization Time Period  progress note by 19th visit    Authorization - Visit Number  11    Authorization - Number of Visits  19    OT Start Time  1113    OT Stop Time  1200    OT Time Calculation (min)  47 min    Activity Tolerance  Patient tolerated treatment well    Behavior During Therapy  [facility] for tasks assessed/performed       Past Medical History:  Diagnosis Date  . Arthritis    shoulder,  . Chronic atrial fibrillation ([place])   . Dyslipidemia   . Dysrhythmia    AFib  . Epistaxis   . Hypertension     Past Surgical History:  Procedure Laterality Date  . REVERSE SHOULDER ARTHROPLASTY Left 09/16/2019   Procedure: REVERSE SHOULDER ARTHROPLASTY;  Surgeon: Justice Britain, MD;  Location: [facility];  Service: Orthopedics;  Laterality: Left;  172min  . SHOULDER SURGERY     Right    There were no vitals filed for this visit.  Subjective Assessment - 12/15/19 1702    Subjective   I am trying to do some of the exercises at home, but there are some movements that I just cannot do    Pertinent History  Pt is a 74 y/o male s/p left reverse TSA on 09/16/2019. Pt reports difficulty with ADLs and functional movements of LUE. Pt was referred to occupational therapy for evaluation and treatment by Dr. Justice Britain.    Patient Stated Goals  To be able to move my arm correctly.    Pain Score  1     Pain Location  Shoulder    Pain Orientation  Left    Pain Descriptors / Indicators   Discomfort    Pain Type  Acute pain                   OT Treatments/Exercises (OP) - 12/15/19 0001      Exercises   Exercises  Shoulder      PROM/AAROM - flexion/extension, abduction, ER completed both in supine and in seated position with focus on scapular position.   He requires ongoing cues due to guarding and proximal tightness.  He presents with significant subluxation which reduces with gentle distal input.  Completed closed chain exercises in stance with movement in flexion, lateral reach and ER.         OT Education - 12/15/19 1703    Education Details  reviewed HEP, posture and scapular mobility    Person(s) Educated  Patient    Methods  Explanation;Demonstration;Verbal cues    Comprehension  Verbalized understanding;Returned demonstration;Verbal cues required       OT Short Term Goals - 11/16/19 1643      OT SHORT TERM GOAL #1   Title  Pt will be provided with and educated on HEP to improve use of LUE during ADL completion.  Time  4    Period  Weeks    Status  On-going    Target Date  12/10/19      OT SHORT TERM GOAL #2   Title  Pt will increase P/ROM of LUE to [facility] to improve ability to use LUE as assist when completing dressing tasks.    Time  4    Period  Weeks    Status  On-going      OT SHORT TERM GOAL #3   Title  Pt will improve LUE strength to 3+/5 to increase ability to use LUE for overhead reaching tasks.    Time  4    Period  Weeks    Status  On-going        OT Long Term Goals - 11/16/19 1643      OT LONG TERM GOAL #1   Title  Pt will return to highest level of functioning and independence in ADL completion using LUE as non-dominant.    Time  8    Period  Weeks    Status  On-going      OT LONG TERM GOAL #2   Title  Pt will decrease pain in LUE to 3/10 or less to improve ability to sleep for 4 consecutive hours or more at night.    Time  8    Period  Weeks    Status  On-going      OT LONG TERM GOAL #3   Title  Pt will  increase LUE A/ROM to [facility] to improve ability to reach over head and behind back during dressing and bathing tasks.    Time  8    Period  Weeks    Status  On-going      OT LONG TERM GOAL #4   Title  Pt will decrease LUE fascial restrictions to minimal amounts or less to improve mobility required for functional reaching tasks.    Time  8    Period  Weeks    Status  On-going      OT LONG TERM GOAL #5   Title  Pt will increase LUE strength to 4+/5 or greater to increase ability to hold up cameras during photography.    Time  8    Period  Weeks    Status  On-going            Plan - 12/15/19 1705    Clinical Impression Statement  Patient states that his pain is improving but he continues with functional limitations, he requires ongoing cues for posture and technique.    Body Structure / Function / Physical Skills  ADL;UE functional use;Fascial restriction;Endurance;Pain;ROM;IADL;Strength    OT Treatment/Interventions  Self-care/ADL training;Ultrasound;Patient/family education;Passive range of motion;Cryotherapy;Electrical Stimulation;Moist Heat;Therapeutic exercise;Manual Therapy;Therapeutic activities    Plan  P: Follow up on pulleys at home, functional task working on reaching; discuss HEP and update as needed, focus on scapular postion    Consulted and Agree with Plan of Care  Patient       Patient will benefit from skilled therapeutic intervention in order to improve the following deficits and impairments:   Body Structure / Function / Physical Skills: ADL, UE functional use, Fascial restriction, Endurance, Pain, ROM, IADL, Strength       Visit Diagnosis: Stiffness of left shoulder, not elsewhere classified    Problem List Patient Active Problem List   Diagnosis Date Noted  . 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 Long term (current) use of anticoagulants 03/14/2011  .  HYPOTHYROIDISM 12/26/2009  . HYPERLIPIDEMIA 12/26/2009  . ATRIAL FIBRILLATION  12/26/2009  . SHOULDER PAIN, LEFT 12/26/2009    Bonnita Levan Carena Stream OTR/L 12/15/2019, 5:26 PM  [place], [place], [address] Phone: [phone]   Fax:  [phone]  Name: KYLIN REDHEAD MRN: [id number] Date of Birth: [birth year]

## 2019-12-15 NOTE — Therapy (Deleted)
Occupational Therapy Treatment Patient Details  Name: William Sandoval MRN: [id number] Date of Birth: 07-Jul-1946  Today's Date: 12/15/2019 Time:  -     Visit#: 11 of 16  Re-eval: 01/09/20    Authorization:    Authorization Time Period:    Authorization Visit#:   of    Subjective Symptoms/Limitations Subjective : I am trying to do some of the exercises at home, but there are some movements that I just cannot do Pertinent History: Pt is a 74 y/o male s/p left reverse TSA on 09/16/2019. Pt reports difficulty with ADLs and functional movements of LUE. Pt was referred to occupational therapy for evaluation and treatment by Dr. Justice Britain. Patient Stated Goals: To be able to move my arm correctly. Pain Assessment Currently in Pain?: No/denies Pain Score: 1  Pain Location: Shoulder Pain Orientation: Left Pain Type: Acute pain  Precautions/Restrictions     Exercise/Treatments {Exercises:[id number]}       Occupational Therapy Assessment and Plan OT Assessment and Plan Clinical Impression Statement: Patient states that his pain is improving but he continues with functional limitations, he requires ongoing cues for posture and technique. Pt will benefit from skilled therapeutic intervention in order to improve on the following performance deficits: Body Structure / Function / Physical Skills Body Structure / Function / Physical Skills: ADL;UE functional use;Fascial restriction;Endurance;Pain;ROM;IADL;Strength OT Treatment/Interventions: Self-care/ADL training;Ultrasound;Patient/family education;Passive range of motion;Cryotherapy;Electrical Stimulation;Moist Heat;Therapeutic exercise;Manual Therapy;Therapeutic activities Plan: P: Follow up on pulleys at home, functional task working on reaching; discuss HEP and update as needed, focus on scapular postion Consulted and Agree with Plan of Care: Patient   Goals    Problem List Patient Active Problem List   Diagnosis Date Noted  . S/P  reverse total shoulder arthroplasty, 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  . Long term (current) use of anticoagulants 03/14/2011  . HYPOTHYROIDISM 12/26/2009  . HYPERLIPIDEMIA 12/26/2009  . ATRIAL FIBRILLATION 12/26/2009  . SHOULDER PAIN, LEFT 12/26/2009    End of Session Activity Tolerance: Patient tolerated treatment well Behavior During Therapy: [facility] for tasks assessed/performed General Behavior During Therapy: [facility] for tasks assessed/performed OT Plan of Care Consulted and Agree with Plan of Care: Patient  GO    William Sandoval 12/15/2019, 5:21 PM

## 2019-12-17 ENCOUNTER — Ambulatory Visit (HOSPITAL_COMMUNITY): Payer: Medicare HMO | Admitting: Occupational Therapy

## 2019-12-17 ENCOUNTER — Other Ambulatory Visit: Payer: Self-pay

## 2019-12-17 DIAGNOSIS — M25612 Stiffness of left shoulder, not elsewhere classified: Secondary | ICD-10-CM

## 2019-12-17 DIAGNOSIS — M25512 Pain in left shoulder: Secondary | ICD-10-CM | POA: Diagnosis not present

## 2019-12-17 DIAGNOSIS — R29898 Other symptoms and signs involving the musculoskeletal system: Secondary | ICD-10-CM | POA: Diagnosis not present

## 2019-12-17 NOTE — Therapy (Signed)
[name], [place], [address] Phone: [phone]   Fax:  [phone]  Occupational Therapy Treatment  Patient Details  Name: William Sandoval MRN: [id number] Date of Birth: 12/20/1945 Referring Provider (OT): Dr. Justice Britain   Encounter Date: 12/17/2019  OT End of Session - 12/17/19 1113    Visit Number  12    Number of Visits  16    Date for OT Re-Evaluation  01/09/20    Authorization Type  [employer]; $40 copay, no visit limit    Authorization Time Period  progress note by 19th visit    Authorization - Visit Number  12    Authorization - Number of Visits  19    OT Start Time  1031    OT Stop Time  1115    OT Time Calculation (min)  44 min    Activity Tolerance  Patient limited by pain    Behavior During Therapy  [facility] for tasks assessed/performed       Past Medical History:  Diagnosis Date  . Arthritis    shoulder,  . Chronic atrial fibrillation ([place])   . Dyslipidemia   . Dysrhythmia    AFib  . Epistaxis   . Hypertension     Past Surgical History:  Procedure Laterality Date  . REVERSE SHOULDER ARTHROPLASTY Left 09/16/2019   Procedure: REVERSE SHOULDER ARTHROPLASTY;  Surgeon: Justice Britain, MD;  Location: [facility];  Service: Orthopedics;  Laterality: Left;  18min  . SHOULDER SURGERY     Right    There were no vitals filed for this visit.  Subjective Assessment - 12/17/19 1044    Subjective   I cleaned out my freezer and now my arm hurts a little more    Pertinent History  Pt is a 74 y/o male s/p left reverse TSA on 09/16/2019. Pt reports difficulty with ADLs and functional movements of LUE. Pt was referred to occupational therapy for evaluation and treatment by Dr. Justice Britain.    Currently in Pain?  Yes    Pain Score  2     Pain Location  Shoulder    Pain Orientation  Left    Pain Descriptors / Indicators  Discomfort    Pain Type  Acute pain    Pain Onset  1 to 4 weeks ago    Pain  Frequency  Intermittent    Aggravating Factors   certain positioning    Multiple Pain Sites  No                   OT Treatments/Exercises (OP) - 12/17/19 0001      Shoulder Exercises: Supine   Protraction  PROM;AAROM;10 reps    Horizontal ABduction  PROM;AAROM;10 reps    External Rotation  PROM;10 reps    Internal Rotation  PROM;10 reps    Flexion  PROM;AAROM;10 reps    Flexion Limitations  to 90    ABduction  PROM;AAROM;10 reps    ABduction Limitations  to 90      Shoulder Exercises: Seated   Retraction  PROM;AAROM;10 reps      Shoulder Exercises: Standing   Protraction  AROM;10 reps    Other Standing Exercises  closed chain flex/ext, abd/add on surface    Other Standing Exercises  elbow flex/ext 10 reps      Manual Therapy   Manual Therapy  Joint mobilization;Soft tissue mobilization;Scapular mobilization    Manual therapy comments  supine at start of  session             OT Education - 12/17/19 1113    Education Details  reviewed HEP, posture and scapular mobility, he continues to require ongoing cues for posture and positioning    Person(s) Educated  Patient    Methods  Explanation;Demonstration;Verbal cues    Comprehension  Verbalized understanding;Returned demonstration;Verbal cues required       OT Short Term Goals - 11/16/19 1643      OT SHORT TERM GOAL #1   Title  Pt will be provided with and educated on HEP to improve use of LUE during ADL completion.    Time  4    Period  Weeks    Status  On-going    Target Date  12/10/19      OT SHORT TERM GOAL #2   Title  Pt will increase P/ROM of LUE to [facility] to improve ability to use LUE as assist when completing dressing tasks.    Time  4    Period  Weeks    Status  On-going      OT SHORT TERM GOAL #3   Title  Pt will improve LUE strength to 3+/5 to increase ability to use LUE for overhead reaching tasks.    Time  4    Period  Weeks    Status  On-going        OT Long Term Goals - 11/16/19  1643      OT LONG TERM GOAL #1   Title  Pt will return to highest level of functioning and independence in ADL completion using LUE as non-dominant.    Time  8    Period  Weeks    Status  On-going      OT LONG TERM GOAL #2   Title  Pt will decrease pain in LUE to 3/10 or less to improve ability to sleep for 4 consecutive hours or more at night.    Time  8    Period  Weeks    Status  On-going      OT LONG TERM GOAL #3   Title  Pt will increase LUE A/ROM to [facility] to improve ability to reach over head and behind back during dressing and bathing tasks.    Time  8    Period  Weeks    Status  On-going      OT LONG TERM GOAL #4   Title  Pt will decrease LUE fascial restrictions to minimal amounts or less to improve mobility required for functional reaching tasks.    Time  8    Period  Weeks    Status  On-going      OT LONG TERM GOAL #5   Title  Pt will increase LUE strength to 4+/5 or greater to increase ability to hold up cameras during photography.    Time  8    Period  Weeks    Status  On-going            Plan - 12/17/19 1115    Clinical Impression Statement  Patient states that he is going to resume therapy after his next MD visit.  He continues to have pain intermittenly with some movement and difficulty maintaining posture/stability efforts that limits overall progress.    Plan  patient will continue with use of pulleys and current HEP until follow up session with MD    Consulted and Agree with Plan of Care  Patient  Patient will benefit from skilled therapeutic intervention in order to improve the following deficits and impairments:           Visit Diagnosis: Stiffness of left shoulder, not elsewhere classified    Problem List Patient Active Problem List   Diagnosis Date Noted  . 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 Long term (current) use of anticoagulants 03/14/2011  . HYPOTHYROIDISM 12/26/2009  . HYPERLIPIDEMIA 12/26/2009  .  ATRIAL FIBRILLATION 12/26/2009  . SHOULDER PAIN, LEFT 12/26/2009    [name] 12/17/2019, 12:22 PM  [name] [address] [place], [place], [address] Phone: [phone]   Fax:  [phone]  Name: William Sandoval MRN: [id number] Date of Birth: Jun 06, 1946

## 2019-12-23 DIAGNOSIS — Z7901 Long term (current) use of anticoagulants: Secondary | ICD-10-CM | POA: Diagnosis not present

## 2019-12-23 DIAGNOSIS — E7849 Other hyperlipidemia: Secondary | ICD-10-CM | POA: Diagnosis not present

## 2019-12-23 DIAGNOSIS — I10 Essential (primary) hypertension: Secondary | ICD-10-CM | POA: Diagnosis not present

## 2019-12-23 DIAGNOSIS — I4891 Unspecified atrial fibrillation: Secondary | ICD-10-CM | POA: Diagnosis not present

## 2019-12-23 DIAGNOSIS — E039 Hypothyroidism, unspecified: Secondary | ICD-10-CM | POA: Diagnosis not present

## 2019-12-30 DIAGNOSIS — L309 Dermatitis, unspecified: Secondary | ICD-10-CM | POA: Diagnosis not present

## 2020-01-04 ENCOUNTER — Other Ambulatory Visit: Payer: Self-pay

## 2020-01-04 NOTE — Patient Outreach (Signed)
[facility]) Care Management  01/04/2020  William Sandoval 20-Jun-1946 [id number]   Telephone Assessment    Outreach attempt #1 to patient. No answer. RN CM left HIPAA compliant voicemail message along with contact info.     Plan: RN CM will make outreach attempt to patient within a month if no return call from patient.    [name], RN,BSN,CCM [place] Management Telephonic Care Management Coordinator Direct Phone: [phone] Toll Free: [phone] Fax: [phone]

## 2020-01-08 DIAGNOSIS — L309 Dermatitis, unspecified: Secondary | ICD-10-CM | POA: Diagnosis not present

## 2020-01-12 DIAGNOSIS — E039 Hypothyroidism, unspecified: Secondary | ICD-10-CM | POA: Diagnosis not present

## 2020-01-12 DIAGNOSIS — I10 Essential (primary) hypertension: Secondary | ICD-10-CM | POA: Diagnosis not present

## 2020-01-12 DIAGNOSIS — I4891 Unspecified atrial fibrillation: Secondary | ICD-10-CM | POA: Diagnosis not present

## 2020-01-12 DIAGNOSIS — Z7901 Long term (current) use of anticoagulants: Secondary | ICD-10-CM | POA: Diagnosis not present

## 2020-01-12 DIAGNOSIS — E7849 Other hyperlipidemia: Secondary | ICD-10-CM | POA: Diagnosis not present

## 2020-01-25 DIAGNOSIS — L503 Dermatographic urticaria: Secondary | ICD-10-CM | POA: Diagnosis not present

## 2020-01-25 DIAGNOSIS — L299 Pruritus, unspecified: Secondary | ICD-10-CM | POA: Diagnosis not present

## 2020-02-02 ENCOUNTER — Other Ambulatory Visit: Payer: Self-pay

## 2020-02-02 NOTE — Patient Outreach (Signed)
[facility]) Care Management  02/02/2020  William Sandoval [birth year] [id number]   Telephone Assessment   Outreach attempt #1 to patient. Spoke with patient who denies any acute issues or concerns at present. He states that he has completed outpatient rehab. He voices that he can tell that he is able to move and use shoulder a little more. Pian remains minimal. He voices back to ortho MD in April. Appetite WNL for patient. Patient reports that he is strongly considering getting COVID-19 vaccine. RN CM educated patient on risks and benefits. Patient will follow up on making an appt. He denies any RN CM needs or concerns at this time.      Plan: RN CM discussed with patient next outreach within the month of April. Patient gave verbal consent and in agreement with RN CM follow up and timeframe. Patient aware that they may contact RN CM sooner for any issues or concerns.  [name], RN,BSN,CCM [place] Management Telephonic Care Management Coordinator Direct Phone: [phone] Toll Free: [phone] Fax: [phone]

## 2020-02-04 ENCOUNTER — Ambulatory Visit: Payer: Self-pay

## 2020-02-07 DIAGNOSIS — E039 Hypothyroidism, unspecified: Secondary | ICD-10-CM | POA: Diagnosis not present

## 2020-02-07 DIAGNOSIS — I4891 Unspecified atrial fibrillation: Secondary | ICD-10-CM | POA: Diagnosis not present

## 2020-02-07 DIAGNOSIS — Z7901 Long term (current) use of anticoagulants: Secondary | ICD-10-CM | POA: Diagnosis not present

## 2020-02-07 DIAGNOSIS — I10 Essential (primary) hypertension: Secondary | ICD-10-CM | POA: Diagnosis not present

## 2020-02-07 DIAGNOSIS — E7849 Other hyperlipidemia: Secondary | ICD-10-CM | POA: Diagnosis not present

## 2020-02-08 DIAGNOSIS — Z96612 Presence of left artificial shoulder joint: Secondary | ICD-10-CM | POA: Diagnosis not present

## 2020-02-08 DIAGNOSIS — Z471 Aftercare following joint replacement surgery: Secondary | ICD-10-CM | POA: Diagnosis not present

## 2020-03-09 ENCOUNTER — Ambulatory Visit: Payer: Medicare HMO | Attending: Internal Medicine

## 2020-03-09 DIAGNOSIS — Z23 Encounter for immunization: Secondary | ICD-10-CM

## 2020-03-09 NOTE — Progress Notes (Signed)
[facility]  Name:  [name]    MRN: [id number] DOB: 1945-12-20  03/09/2020  Mr. Zachery was observed post Covid-19 immunization for 15 minutes without incident. He was provided with Vaccine Information Sheet and instruction to access the V-Safe system.   Mr. Hyke was instructed to call 911 with any severe reactions post vaccine: Marland Kitchen Difficulty breathing  . Swelling of face and throat  . A fast heartbeat  . A bad rash all over body  . Dizziness and weakness   Immunizations Administered    Name Date Dose VIS Date Route   Moderna COVID-19 Vaccine 03/09/2020  8:42 AM 0.5 mL 11/02/2019 Intramuscular   Manufacturer: Moderna   Lot: [id number]   [id number]

## 2020-03-10 ENCOUNTER — Other Ambulatory Visit: Payer: Self-pay

## 2020-03-10 DIAGNOSIS — I4891 Unspecified atrial fibrillation: Secondary | ICD-10-CM | POA: Diagnosis not present

## 2020-03-10 DIAGNOSIS — I10 Essential (primary) hypertension: Secondary | ICD-10-CM | POA: Diagnosis not present

## 2020-03-10 DIAGNOSIS — Z7901 Long term (current) use of anticoagulants: Secondary | ICD-10-CM | POA: Diagnosis not present

## 2020-03-10 DIAGNOSIS — E7849 Other hyperlipidemia: Secondary | ICD-10-CM | POA: Diagnosis not present

## 2020-03-10 DIAGNOSIS — E039 Hypothyroidism, unspecified: Secondary | ICD-10-CM | POA: Diagnosis not present

## 2020-03-10 NOTE — Patient Outreach (Signed)
[facility]) Care Management  03/10/2020  William Sandoval [birth year] [id number]   Telephone Assessment    Outreach attempt # 1 to patient. No answer at present. RN CM left HIPAA compliant voicemail message along with contact info.        Plan: RN CM will make outreach attempt to patient within the month of May if no return call from patient.    [name], RN,BSN,CCM [place] Management Telephonic Care Management Coordinator Direct Phone: [phone] Toll Free: [phone] Fax: [phone]

## 2020-03-17 DIAGNOSIS — D6869 Other thrombophilia: Secondary | ICD-10-CM | POA: Diagnosis not present

## 2020-03-17 DIAGNOSIS — I10 Essential (primary) hypertension: Secondary | ICD-10-CM | POA: Diagnosis not present

## 2020-03-17 DIAGNOSIS — I482 Chronic atrial fibrillation, unspecified: Secondary | ICD-10-CM | POA: Diagnosis not present

## 2020-03-17 DIAGNOSIS — E039 Hypothyroidism, unspecified: Secondary | ICD-10-CM | POA: Diagnosis not present

## 2020-03-17 DIAGNOSIS — I4891 Unspecified atrial fibrillation: Secondary | ICD-10-CM | POA: Diagnosis not present

## 2020-03-17 DIAGNOSIS — Z0001 Encounter for general adult medical examination with abnormal findings: Secondary | ICD-10-CM | POA: Diagnosis not present

## 2020-03-17 DIAGNOSIS — E7849 Other hyperlipidemia: Secondary | ICD-10-CM | POA: Diagnosis not present

## 2020-03-17 DIAGNOSIS — E782 Mixed hyperlipidemia: Secondary | ICD-10-CM | POA: Diagnosis not present

## 2020-03-17 DIAGNOSIS — I4819 Other persistent atrial fibrillation: Secondary | ICD-10-CM | POA: Diagnosis not present

## 2020-03-22 DIAGNOSIS — E7849 Other hyperlipidemia: Secondary | ICD-10-CM | POA: Diagnosis not present

## 2020-03-22 DIAGNOSIS — Z7901 Long term (current) use of anticoagulants: Secondary | ICD-10-CM | POA: Diagnosis not present

## 2020-03-22 DIAGNOSIS — E782 Mixed hyperlipidemia: Secondary | ICD-10-CM | POA: Diagnosis not present

## 2020-03-22 DIAGNOSIS — I482 Chronic atrial fibrillation, unspecified: Secondary | ICD-10-CM | POA: Diagnosis not present

## 2020-03-22 DIAGNOSIS — I4891 Unspecified atrial fibrillation: Secondary | ICD-10-CM | POA: Diagnosis not present

## 2020-03-22 DIAGNOSIS — E039 Hypothyroidism, unspecified: Secondary | ICD-10-CM | POA: Diagnosis not present

## 2020-03-22 DIAGNOSIS — M25512 Pain in left shoulder: Secondary | ICD-10-CM | POA: Diagnosis not present

## 2020-03-22 DIAGNOSIS — Z0001 Encounter for general adult medical examination with abnormal findings: Secondary | ICD-10-CM | POA: Diagnosis not present

## 2020-03-22 DIAGNOSIS — I10 Essential (primary) hypertension: Secondary | ICD-10-CM | POA: Diagnosis not present

## 2020-04-11 ENCOUNTER — Other Ambulatory Visit: Payer: Self-pay

## 2020-04-11 ENCOUNTER — Ambulatory Visit: Payer: Medicare HMO

## 2020-04-11 ENCOUNTER — Ambulatory Visit: Payer: Medicare HMO | Attending: Internal Medicine

## 2020-04-11 DIAGNOSIS — E7849 Other hyperlipidemia: Secondary | ICD-10-CM | POA: Diagnosis not present

## 2020-04-11 DIAGNOSIS — Z7901 Long term (current) use of anticoagulants: Secondary | ICD-10-CM | POA: Diagnosis not present

## 2020-04-11 DIAGNOSIS — I4891 Unspecified atrial fibrillation: Secondary | ICD-10-CM | POA: Diagnosis not present

## 2020-04-11 DIAGNOSIS — E039 Hypothyroidism, unspecified: Secondary | ICD-10-CM | POA: Diagnosis not present

## 2020-04-11 DIAGNOSIS — Z23 Encounter for immunization: Secondary | ICD-10-CM

## 2020-04-11 DIAGNOSIS — I10 Essential (primary) hypertension: Secondary | ICD-10-CM | POA: Diagnosis not present

## 2020-04-11 NOTE — Progress Notes (Signed)
   Covid-19 Vaccination Clinic  Name:  William Sandoval    MRN: [id number] DOB: 01/26/1946  04/11/2020  [name] was observed post Covid-19 immunization for 15 minutes without incident. He was provided with Vaccine Information Sheet and instruction to access the V-Safe system.   [name] was instructed to call 911 with any severe reactions post vaccine: Marland Kitchen Difficulty breathing  . Swelling of face and throat  . A fast heartbeat  . A bad rash all over body  . Dizziness and weakness   Immunizations Administered    Name Date Dose VIS Date Route   Moderna COVID-19 Vaccine 04/11/2020  9:09 AM 0.5 mL 11/2019 Intramuscular   Manufacturer: Moderna   Lot: [id number]   [id number]

## 2020-04-14 ENCOUNTER — Other Ambulatory Visit: Payer: Self-pay

## 2020-04-14 NOTE — Patient Outreach (Signed)
[facility]) Care Management  04/14/2020  William Sandoval Mar 21, 1946 [id number]   Telephone Assessment   Incoming call from patient returning RN CM call. Spoke with patient who denies any acute issues or concerns. He is pleased to report that things have been going well for him. He voices that he is feeling good. He has been able to get out and enjoy the nice weather. Patient reports he has gotten both of his COVID_19 vaccines(Moderna) on 03/09/20 and 04/11/20. He states he tolerated them well and only had a sore arm with second dose. He is happy that he has now been able to visit with his sister who is also vaccinated. Appetite remains good and WNL for patient. No abnormal wgt changes. He remains independent with ADLs/IADLs. No recent falls. Patient states that he is due to see PCP within the next month or so. He denies any RN CM needs or concerns at present.     Medications Reviewed Today    Reviewed by [name], RN (Registered Nurse) on 04/14/20 at 1116  Med List Status: <None>  Medication Order Taking? Sig Documenting Provider Last Dose Status Informant  acetaminophen (TYLENOL) 650 MG CR tablet [id number] No Take 650-1,300 mg by mouth every 8 (eight) hours as needed for pain. [provider] Taking Active Self  camphor-menthol Timoteo Ace) lotion [id number]  Apply 1 application topically as needed for itching. [provider]  Active Self  Cetirizine HCl (ZYRTEC ALLERGY) 10 MG CAPS [id number] No Take 10 mg by mouth daily as needed (allergies.).  [provider] Taking Active Self  levothyroxine (SYNTHROID) 100 MCG tablet [id number] No Take 100 mcg by mouth daily before breakfast. [provider] Taking Active Self  metoprolol (LOPRESSOR) 50 MG tablet [id number] No Take 50 mg by mouth 2 (two) times daily. [provider] Taking Active Self  oxyCODONE-acetaminophen (PERCOCET) 5-325 MG tablet [id number] No Take 1 tablet by mouth  every 4 (four) hours as needed (max 6 q). Shuford, Olivia Mackie, PA-C Taking Active   simvastatin (ZOCOR) 20 MG tablet [id number] No Take 20 mg by mouth daily.  [provider] Taking Active Self  warfarin (COUMADIN) 5 MG tablet [id number] No Take 5-7.5 mg by mouth See admin instructions. Take 1.5 tablets (7.5 mg) on Mondays & Fridays, then take 1 tablet (5 mg) by mouth on all other days. [provider] Taking Active Self          [facility] CM Care Plan Problem One     Most Recent Value  Care Plan Problem One  Patient at risk for hospital readmission due to co-morbidities.  Role Documenting the Problem One  Care Management Telephonic [place] for Problem One  Active  [facility] Long Term Goal   Patient will have no hospitalizations over the next 90 days.  [facility] Long Term Goal Start Date  04/14/20  Interventions for Problem One Long Term Goal  RN CM assessed for any acute issues/sxs. RNCM confirmed MD appts in [place] for follow up. RN CM educated pt on s/s of worsening condition and when to seek medical attention.    [facility] CM Care Plan Problem Three     Most Recent Value  Care Plan Problem Three  Health maintenance-patient needs COVID-19 vaccine  Role Documenting the Problem Three  Care Management Telephonic Coordinator  Care Plan for Problem Three  Active  [facility] Long Term Goal   Patient will report obtaining COVID-19 vaccine within the next 90 days  [facility] Long  Term Goal Start Date  02/02/20      Plan: RN CM discussed with patient next outreach within the month of July . Patient gave verbal consent and in agreement with RN CM follow up and timeframe. Patient aware that they may contact RN CM sooner for any issues or concerns. RN CM will send quarterly update to PCP.    [name], RN,BSN,CCM [place] Management Telephonic Care Management Coordinator Direct Phone: [phone] Toll Free: [phone] Fax: [phone]

## 2020-04-14 NOTE — Patient Outreach (Signed)
[facility]) Care Management  04/14/2020  OMARE CORDLE August 26, 1946 [id number]   Telephone Assessment     Outreach attempt to patient. No answer at present. RN CM left HIPAA compliant voicemail message along with contact info.      Plan: RN CM make outreach attempt to patient within the month of June if no return call from patient.    [name], RN,BSN,CCM [place] Management Telephonic Care Management Coordinator Direct Phone: [phone] Toll Free: [phone] Fax: [phone]

## 2020-04-19 ENCOUNTER — Ambulatory Visit: Payer: Self-pay

## 2020-04-19 DIAGNOSIS — I10 Essential (primary) hypertension: Secondary | ICD-10-CM | POA: Diagnosis not present

## 2020-04-19 DIAGNOSIS — D6869 Other thrombophilia: Secondary | ICD-10-CM | POA: Diagnosis not present

## 2020-04-19 DIAGNOSIS — I4891 Unspecified atrial fibrillation: Secondary | ICD-10-CM | POA: Diagnosis not present

## 2020-04-19 DIAGNOSIS — I482 Chronic atrial fibrillation, unspecified: Secondary | ICD-10-CM | POA: Diagnosis not present

## 2020-04-19 DIAGNOSIS — E782 Mixed hyperlipidemia: Secondary | ICD-10-CM | POA: Diagnosis not present

## 2020-04-19 DIAGNOSIS — E039 Hypothyroidism, unspecified: Secondary | ICD-10-CM | POA: Diagnosis not present

## 2020-04-19 DIAGNOSIS — E7849 Other hyperlipidemia: Secondary | ICD-10-CM | POA: Diagnosis not present

## 2020-04-19 DIAGNOSIS — Z0001 Encounter for general adult medical examination with abnormal findings: Secondary | ICD-10-CM | POA: Diagnosis not present

## 2020-04-19 DIAGNOSIS — I4819 Other persistent atrial fibrillation: Secondary | ICD-10-CM | POA: Diagnosis not present

## 2020-05-03 DIAGNOSIS — Z7901 Long term (current) use of anticoagulants: Secondary | ICD-10-CM | POA: Diagnosis not present

## 2020-05-03 DIAGNOSIS — I482 Chronic atrial fibrillation, unspecified: Secondary | ICD-10-CM | POA: Diagnosis not present

## 2020-05-24 DIAGNOSIS — Z7901 Long term (current) use of anticoagulants: Secondary | ICD-10-CM | POA: Diagnosis not present

## 2020-05-24 DIAGNOSIS — I482 Chronic atrial fibrillation, unspecified: Secondary | ICD-10-CM | POA: Diagnosis not present

## 2020-05-31 DIAGNOSIS — E039 Hypothyroidism, unspecified: Secondary | ICD-10-CM | POA: Diagnosis not present

## 2020-05-31 DIAGNOSIS — Z7901 Long term (current) use of anticoagulants: Secondary | ICD-10-CM | POA: Diagnosis not present

## 2020-05-31 DIAGNOSIS — I4891 Unspecified atrial fibrillation: Secondary | ICD-10-CM | POA: Diagnosis not present

## 2020-05-31 DIAGNOSIS — E7849 Other hyperlipidemia: Secondary | ICD-10-CM | POA: Diagnosis not present

## 2020-05-31 DIAGNOSIS — I10 Essential (primary) hypertension: Secondary | ICD-10-CM | POA: Diagnosis not present

## 2020-06-07 ENCOUNTER — Other Ambulatory Visit: Payer: Self-pay

## 2020-06-07 NOTE — Patient Outreach (Signed)
[facility]) Care Management  06/07/2020  William Sandoval 10-18-1946 [id number]   Telephone Assessment Annual Assessment   Outreach attempt # 1 to patient. Spoke with patient who denies any acute issues or concern sat present. He reports that his shoulder continues to heal. At times it bothers him slightly with some soreness. Patient voices that he was getting out of recliner chair and lost balance and fell but did not hit floor as he grabbed hold of furniture. He reports that this was an isolated event and he had been sitting in recliner for too long and did not ger up properly. RN CM reviewed and discussed fall/safety prevention measures. Patient reports he has PCP appt this month. He reports that he is due for INR check.He denies any issues with current levels. He denies any RN CM needs or concerns at this time.    Medications Reviewed Today    Reviewed by [name], RN (Registered Nurse) on 06/07/20 at 1044  Med List Status: <None>  Medication Order Taking? Sig Documenting Provider Last Dose Status Informant  acetaminophen (TYLENOL) 650 MG CR tablet [id number] No Take 650-1,300 mg by mouth every 8 (eight) hours as needed for pain. [provider] Taking Active Self  camphor-menthol Timoteo Ace) lotion [id number]  Apply 1 application topically as needed for itching. [provider]  Active Self  Cetirizine HCl (ZYRTEC ALLERGY) 10 MG CAPS [id number] No Take 10 mg by mouth daily as needed (allergies.).  [provider] Taking Active Self  levothyroxine (SYNTHROID) 100 MCG tablet [id number] No Take 100 mcg by mouth daily before breakfast. [provider] Taking Active Self  metoprolol (LOPRESSOR) 50 MG tablet [id number] No Take 50 mg by mouth 2 (two) times daily. [provider] Taking Active Self  oxyCODONE-acetaminophen (PERCOCET) 5-325 MG tablet [id number] No Take 1 tablet by mouth every 4 (four) hours as needed (max 6 q).  Shuford, Olivia Mackie, PA-C Taking Active   simvastatin (ZOCOR) 20 MG tablet [id number] No Take 20 mg by mouth daily.  [provider] Taking Active Self  warfarin (COUMADIN) 5 MG tablet [id number] No Take 5-7.5 mg by mouth See admin instructions. Take 1.5 tablets (7.5 mg) on Mondays & Fridays, then take 1 tablet (5 mg) by mouth on all other days. [provider] Taking Active Self          Fall Risk  06/07/2020 04/14/2020 02/02/2020 12/07/2019 09/20/2019  Falls in the past year? 1 0 0 0 0  Number falls in past yr: 0 0 0 0 0  Injury with Fall? 0 0 0 0 0  Risk for fall due to : Medication side effect;History of fall(s) Medication side effect Medication side effect;Orthopedic patient Medication side effect;Orthopedic patient Orthopedic patient;Medication side effect  Follow up Falls evaluation completed;Education provided Falls evaluation completed;Education provided Falls evaluation completed;Education provided Falls evaluation completed;Education provided Falls evaluation completed;Education provided    Depression screen [facility] 2/9 06/07/2020 09/20/2019  Decreased Interest 0 0  Down, Depressed, Hopeless 0 0  PHQ - 2 Score 0 0    SDOH Screenings   Alcohol Screen:   . Last Alcohol Screening Score (AUDIT):   Depression (PHQ2-9): Low Risk   . PHQ-2 Score: 0  Financial Resource Strain: High Risk  . Difficulty of Paying Living Expenses: Very hard  Food Insecurity: No Food Insecurity  . Worried About Charity fundraiser in the Last Year: Never true  . Ran Out of Food in the Last Year: Never true  Housing: Medium Risk  . Last Housing Risk Score: 1  Physical Activity:   . Days of Exercise per Week:   . Minutes of Exercise per Session:   Social Connections:   . Frequency of Communication with Friends and Family:   . Frequency of Social Gatherings with Friends and Family:   . Attends Religious Services:   . Active Member of Clubs or Organizations:   . Attends Archivist  Meetings:   Marland Kitchen Marital Status:   Stress:   . Feeling of Stress :   Tobacco Use: Low Risk   . Smoking Tobacco Use: Never Smoker  . Smokeless Tobacco Use: Never Used  Transportation Needs: No Transportation Needs  . Lack of Transportation (Medical): No  . Lack of Transportation (Non-Medical): No    THN CM Care Plan Problem One     Most Recent Value  Care Plan Problem One Patient at risk for falls due to weakness/deconditioning  Role Documenting the Problem One Care Management [place] for Problem One Active  [facility] Long Term Goal  Patient will report no falls within the next 60 days.  THN Long Term Goal Start Date 06/07/20  THN Long Term Goal Met Date 06/07/20  Interventions for Problem One Long Term Goal RN CM completed fall/safety eval. RN CM discussed fall prevention/safety measures in the home.     [facility] CM Care Plan Problem Two     Most Recent Value  Care Plan Problem Two Patient on chronic anticoagulations.  Role Documenting the Problem Two Care Management Telephonic [place] for Problem Two Active  Interventions for Problem Two Long Term Goal  RN CM discussed importance of med adherence. RN CM confirmed pt being followed and having routine lab work checks.  THN Long Term Goal Patient will report maintaining therapeutic INR level over the next 90 days.  THN Long Term Goal Start Date 06/07/20    [facility] CM Care Plan Problem Three     Most Recent Value  Care Plan Problem Three Health maintenance-patient needs COVID-19 vaccine  Role Documenting the Problem Three Care Management Telephonic Coordinator  Care Plan for Problem Three Active  THN Long Term Goal  Patient will report obtaining COVID-19 vaccine within the next 90 days  THN Long Term Goal Start Date 02/02/20     Plan: RN CM discussed with patient next outreach within the month of Sept. Patient gave verbal consent and in agreement with RN CM follow up and timeframe. Patient aware that they  may contact RN CM sooner for any issues or concerns.   [name], RN,BSN,CCM [place] Management Telephonic Care Management Coordinator Direct Phone: [phone] Toll Free: [phone] Fax: [phone]

## 2020-06-12 ENCOUNTER — Ambulatory Visit: Payer: Self-pay

## 2020-06-15 DIAGNOSIS — I4891 Unspecified atrial fibrillation: Secondary | ICD-10-CM | POA: Diagnosis not present

## 2020-06-15 DIAGNOSIS — E039 Hypothyroidism, unspecified: Secondary | ICD-10-CM | POA: Diagnosis not present

## 2020-06-15 DIAGNOSIS — Z7901 Long term (current) use of anticoagulants: Secondary | ICD-10-CM | POA: Diagnosis not present

## 2020-06-15 DIAGNOSIS — E7849 Other hyperlipidemia: Secondary | ICD-10-CM | POA: Diagnosis not present

## 2020-06-15 DIAGNOSIS — I10 Essential (primary) hypertension: Secondary | ICD-10-CM | POA: Diagnosis not present

## 2020-06-20 DIAGNOSIS — Z7901 Long term (current) use of anticoagulants: Secondary | ICD-10-CM | POA: Diagnosis not present

## 2020-06-20 DIAGNOSIS — I482 Chronic atrial fibrillation, unspecified: Secondary | ICD-10-CM | POA: Diagnosis not present

## 2020-07-05 DIAGNOSIS — E039 Hypothyroidism, unspecified: Secondary | ICD-10-CM | POA: Diagnosis not present

## 2020-07-05 DIAGNOSIS — E7849 Other hyperlipidemia: Secondary | ICD-10-CM | POA: Diagnosis not present

## 2020-07-05 DIAGNOSIS — I10 Essential (primary) hypertension: Secondary | ICD-10-CM | POA: Diagnosis not present

## 2020-07-05 DIAGNOSIS — I4891 Unspecified atrial fibrillation: Secondary | ICD-10-CM | POA: Diagnosis not present

## 2020-07-05 DIAGNOSIS — Z7901 Long term (current) use of anticoagulants: Secondary | ICD-10-CM | POA: Diagnosis not present

## 2020-07-13 ENCOUNTER — Other Ambulatory Visit: Payer: Medicare HMO

## 2020-07-13 ENCOUNTER — Other Ambulatory Visit: Payer: Self-pay

## 2020-07-13 DIAGNOSIS — Z20822 Contact with and (suspected) exposure to covid-19: Secondary | ICD-10-CM | POA: Diagnosis not present

## 2020-07-15 LAB — SARS-COV-2, NAA 2 DAY TAT

## 2020-07-15 LAB — NOVEL CORONAVIRUS, NAA: SARS-CoV-2, NAA: NOT DETECTED

## 2020-07-18 DIAGNOSIS — Z7901 Long term (current) use of anticoagulants: Secondary | ICD-10-CM | POA: Diagnosis not present

## 2020-07-18 DIAGNOSIS — I482 Chronic atrial fibrillation, unspecified: Secondary | ICD-10-CM | POA: Diagnosis not present

## 2020-07-21 ENCOUNTER — Encounter (HOSPITAL_COMMUNITY): Payer: Self-pay | Admitting: Occupational Therapy

## 2020-07-21 NOTE — Therapy (Signed)
[place], [place], [address] Phone: [phone]   Fax:  [phone]  Patient Details  Name: William Sandoval MRN: [id number] Date of Birth: March 15, 1946 Referring Provider:  No ref. provider found  Encounter Date: 07/21/2020   OCCUPATIONAL THERAPY DISCHARGE SUMMARY  Visits from Start of Care: 12  Current functional level related to goals / functional outcomes: Unknown. At last visit pt was going to return to MD for follow up. Pt did not return to therapy after last visit on 12/17/19.    Remaining deficits: Unknown.    Education / Equipment: HEP Plan: Patient agrees to discharge.  Patient goals were not met. Patient is being discharged due to not returning since the last visit.  ?????      Guadelupe Sabin, OTR/L  [phone] 07/21/2020, 1:40 PM  [place] [address] [place], [place], [address] Phone: [phone]   Fax:  [phone]

## 2020-08-03 ENCOUNTER — Other Ambulatory Visit: Payer: Self-pay

## 2020-08-03 NOTE — Patient Outreach (Signed)
[place] [facility]) Care Management  08/03/2020  William Sandoval 12-05-1945 [id number]   Telephone Assessment    Voicemail message received from patient. Return call placed to patient.Spoke with patient who reports he has been dealing with some GI issues. He reports that "nothing tasting right" and decreased appetite. He thought he possibly had COVID-19 although he is fully vaccinated. He was tested a few weeks ago and results were negative.He has PCP appt on next week for further eval as he reported he has had some "oily stools."  He attempted to collect heme stool for testing but was unsuccessful in doing in several times. He is unsure of last colonoscopy. He denies any blood in stools or other GI sxs, He reports eight is down to about 205 lbs. He denies any RN CM needs or concerns at present. He is aware to seek medical attention for any worsening and/or unresolved issues.    Medications Reviewed Today    Reviewed by [name], RN (Registered Nurse) on 08/03/20 at 76  Med List Status: <None>  Medication Order Taking? Sig Documenting Provider Last Dose Status Informant  acetaminophen (TYLENOL) 650 MG CR tablet [id number] No Take 650-1,300 mg by mouth every 8 (eight) hours as needed for pain. [provider] Taking Active Self  camphor-menthol Timoteo Ace) lotion [id number]  Apply 1 application topically as needed for itching. [provider]  Active Self  Cetirizine HCl (ZYRTEC ALLERGY) 10 MG CAPS [id number] No Take 10 mg by mouth daily as needed (allergies.).  [provider] Taking Active Self  levothyroxine (SYNTHROID) 100 MCG tablet [id number] No Take 100 mcg by mouth daily before breakfast. [provider] Taking Active Self  metoprolol (LOPRESSOR) 50 MG tablet [id number] No Take 50 mg by mouth 2 (two) times daily. [provider] Taking Active Self  oxyCODONE-acetaminophen (PERCOCET) 5-325 MG tablet [id number] No Take 1 tablet  by mouth every 4 (four) hours as needed (max 6 q). Shuford, Olivia Mackie, PA-C Taking Active   simvastatin (ZOCOR) 20 MG tablet [id number] No Take 20 mg by mouth daily.  [provider] Taking Active Self  warfarin (COUMADIN) 5 MG tablet [id number] No Take 5-7.5 mg by mouth See admin instructions. Take 1.5 tablets (7.5 mg) on Mondays & Fridays, then take 1 tablet (5 mg) by mouth on all other days. [provider] Taking Active Self           [facility] CM Care Plan Problem One     Most Recent Value  Care Plan Problem One patiet having GI issues  Role Documenting the Problem One Care Management Telephonic Coordinator  Care Plan for Problem One Active  [facility] Long Term Goal  Patient will report resolution of GI sxs over the next 90 days.  THN Long Term Goal Start Date 08/03/20  THN Long Term Goal Met Date 08/03/20  Interventions for Problem One Long Term Goal RN CM completed GI assessment, reviewed s/s of worsening condition and when to seek medical attention, RN CM confirmed pt has MD f/u appt  [facility] CM Short Term Goal #1  Patient will report completing MD appt over the next 30 days.  THN CM Short Term Goal #1 Start Date 08/03/20  Interventions for Short Term Goal #1 RN CM discussed importance of MD follow up appt and need for workup/eval.    [facility] CM Care Plan Problem Two     Most Recent Value  Care Plan Problem Two Patient on chronic anticoagulations.  Role Documenting the Problem  Two Care Management Telephonic [place] for Problem Two Active  THN Long Term Goal Patient will report maintaining therapeutic INR level over the next 90 days.  THN Long Term Goal Start Date 06/07/20  [facility] Long Term Goal Met Date 08/03/20     Plan:  RN CM discussed with patient next outreach within the month of  November. Patient gave verbal consent and in agreement with RN CM follow up and timeframe. Patient aware that they may contact RN CM sooner for any issues or concerns.  RN CM will send  quarterly update to PCP.  [name], RN,BSN,CCM [place] Management Telephonic Care Management Coordinator Direct Phone: [phone] Toll Free: [phone] Fax: [phone]

## 2020-08-03 NOTE — Patient Outreach (Signed)
[place] [facility]) Care Management  08/03/2020  KEARY WATERSON 05-07-1946 [id number]   Telephone Assessment     Outreach attempt #1 to patient. No answer. RN CM left HIPAA compliant voicemail message along with contact info.       Plan: RN CM will make outreach attempt to patient within the month of October if no return call.   [name], RN,BSN,CCM [place] Management Telephonic Care Management Coordinator Direct Phone: [phone] Toll Free: [phone] Fax: [phone]

## 2020-08-09 DIAGNOSIS — E039 Hypothyroidism, unspecified: Secondary | ICD-10-CM | POA: Diagnosis not present

## 2020-08-09 DIAGNOSIS — Z7901 Long term (current) use of anticoagulants: Secondary | ICD-10-CM | POA: Diagnosis not present

## 2020-08-09 DIAGNOSIS — E7849 Other hyperlipidemia: Secondary | ICD-10-CM | POA: Diagnosis not present

## 2020-08-09 DIAGNOSIS — I10 Essential (primary) hypertension: Secondary | ICD-10-CM | POA: Diagnosis not present

## 2020-08-09 DIAGNOSIS — I4891 Unspecified atrial fibrillation: Secondary | ICD-10-CM | POA: Diagnosis not present

## 2020-08-29 DIAGNOSIS — Z7901 Long term (current) use of anticoagulants: Secondary | ICD-10-CM | POA: Diagnosis not present

## 2020-08-29 DIAGNOSIS — I482 Chronic atrial fibrillation, unspecified: Secondary | ICD-10-CM | POA: Diagnosis not present

## 2020-09-05 DIAGNOSIS — E039 Hypothyroidism, unspecified: Secondary | ICD-10-CM | POA: Diagnosis not present

## 2020-09-05 DIAGNOSIS — Z7901 Long term (current) use of anticoagulants: Secondary | ICD-10-CM | POA: Diagnosis not present

## 2020-09-05 DIAGNOSIS — E7849 Other hyperlipidemia: Secondary | ICD-10-CM | POA: Diagnosis not present

## 2020-09-05 DIAGNOSIS — I4891 Unspecified atrial fibrillation: Secondary | ICD-10-CM | POA: Diagnosis not present

## 2020-09-05 DIAGNOSIS — I10 Essential (primary) hypertension: Secondary | ICD-10-CM | POA: Diagnosis not present

## 2020-09-12 DIAGNOSIS — I959 Hypotension, unspecified: Secondary | ICD-10-CM | POA: Diagnosis not present

## 2020-09-12 DIAGNOSIS — L249 Irritant contact dermatitis, unspecified cause: Secondary | ICD-10-CM | POA: Diagnosis not present

## 2020-09-12 DIAGNOSIS — I4891 Unspecified atrial fibrillation: Secondary | ICD-10-CM | POA: Diagnosis not present

## 2020-09-12 DIAGNOSIS — L309 Dermatitis, unspecified: Secondary | ICD-10-CM | POA: Diagnosis not present

## 2020-09-12 DIAGNOSIS — Z0001 Encounter for general adult medical examination with abnormal findings: Secondary | ICD-10-CM | POA: Diagnosis not present

## 2020-09-12 DIAGNOSIS — I482 Chronic atrial fibrillation, unspecified: Secondary | ICD-10-CM | POA: Diagnosis not present

## 2020-09-12 DIAGNOSIS — D6869 Other thrombophilia: Secondary | ICD-10-CM | POA: Diagnosis not present

## 2020-09-12 DIAGNOSIS — E7849 Other hyperlipidemia: Secondary | ICD-10-CM | POA: Diagnosis not present

## 2020-09-12 DIAGNOSIS — R197 Diarrhea, unspecified: Secondary | ICD-10-CM | POA: Diagnosis not present

## 2020-09-12 DIAGNOSIS — K580 Irritable bowel syndrome with diarrhea: Secondary | ICD-10-CM | POA: Diagnosis not present

## 2020-09-12 DIAGNOSIS — Z96612 Presence of left artificial shoulder joint: Secondary | ICD-10-CM | POA: Diagnosis not present

## 2020-09-18 ENCOUNTER — Encounter (HOSPITAL_COMMUNITY)
Admission: RE | Admit: 2020-09-18 | Discharge: 2020-09-18 | Disposition: A | Discharge status: Home / Self Care | Payer: Medicare HMO | Source: Ambulatory Visit | Attending: Internal Medicine | Admitting: Internal Medicine

## 2020-09-18 ENCOUNTER — Encounter (HOSPITAL_COMMUNITY): Payer: Self-pay

## 2020-09-18 ENCOUNTER — Other Ambulatory Visit: Payer: Self-pay

## 2020-09-18 DIAGNOSIS — D509 Iron deficiency anemia, unspecified: Secondary | ICD-10-CM | POA: Insufficient documentation

## 2020-09-18 HISTORY — DX: Anemia, unspecified: D64.9

## 2020-09-18 MED ORDER — SODIUM CHLORIDE 0.9 % IV SOLN
510.0000 mg | Freq: Once | INTRAVENOUS | Status: AC
  Administered 2020-09-18: 510 mg via INTRAVENOUS
  Filled 2020-09-18: qty 17

## 2020-09-18 MED ORDER — SODIUM CHLORIDE 0.9 % IV SOLN: Freq: Once | INTRAVENOUS | Status: AC

## 2020-09-19 DIAGNOSIS — D649 Anemia, unspecified: Secondary | ICD-10-CM | POA: Diagnosis not present

## 2020-09-19 DIAGNOSIS — I959 Hypotension, unspecified: Secondary | ICD-10-CM | POA: Diagnosis not present

## 2020-09-19 DIAGNOSIS — F321 Major depressive disorder, single episode, moderate: Secondary | ICD-10-CM | POA: Diagnosis not present

## 2020-09-20 NOTE — Progress Notes (Deleted)
Referring Provider:  Celene Squibb, MD Primary Care Physician:  Celene Squibb, MD Primary Gastroenterologist:  Dr. Rayne Du chief complaint on file.   HPI:   William Sandoval is a 74 y.o. male presenting today at the request of  Celene Squibb, MD for GI bleed.  He has history of A. fib and is chronically on warfarin.  Past Medical History:  Diagnosis Date  . Anemia   . Arthritis    shoulder,  . Chronic atrial fibrillation ([place])   . Dyslipidemia   . Dysrhythmia    AFib  . Epistaxis   . Hypertension     Past Surgical History:  Procedure Laterality Date  . REVERSE SHOULDER ARTHROPLASTY Left 09/16/2019   Procedure: REVERSE SHOULDER ARTHROPLASTY;  Surgeon: Justice Britain, MD;  Location: [facility] ORS;  Service: Orthopedics;  Laterality: Left;  194min  . SHOULDER SURGERY     Right    Current Outpatient Medications  Medication Sig Dispense Refill  . acetaminophen (TYLENOL) 650 MG CR tablet Take 650-1,300 mg by mouth every 8 (eight) hours as needed for pain.    . camphor-menthol (SARNA) lotion Apply 1 application topically as needed for itching.    . Cetirizine HCl (ZYRTEC ALLERGY) 10 MG CAPS Take 10 mg by mouth daily as needed (allergies.).     Marland Kitchen levothyroxine (SYNTHROID) 100 MCG tablet Take 100 mcg by mouth daily before breakfast.    . metoprolol (LOPRESSOR) 50 MG tablet Take 50 mg by mouth 2 (two) times daily.    Marland Kitchen oxyCODONE-acetaminophen (PERCOCET) 5-325 MG tablet Take 1 tablet by mouth every 4 (four) hours as needed (max 6 q). 20 tablet 0  . simvastatin (ZOCOR) 20 MG tablet Take 20 mg by mouth daily.     Marland Kitchen warfarin (COUMADIN) 5 MG tablet Take 5-7.5 mg by mouth See admin instructions. Take 1.5 tablets (7.5 mg) on Mondays & Fridays, then take 1 tablet (5 mg) by mouth on all other days.     No current facility-administered medications for this visit.    Allergies as of 09/21/2020 - Review Complete 09/18/2020  Allergen Reaction Noted  . Gluten meal Other (See Comments) 08/26/2015  .  Shrimp [shellfish allergy] Other (See Comments) 08/26/2015  . Sertraline Rash 08/25/2015    Family History  Problem Relation Age of Onset  . Heart attack Mother     Social History   Socioeconomic History  . Marital status: Single    Spouse name: Not on file  . Number of children: Not on file  . Years of education: Not on file  . Highest education level: Not on file  Occupational History  . 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 Smoking status: Never Smoker  . Smokeless tobacco: Never Used  Vaping Use  . Vaping Use: Never used  Substance and Sexual Activity  . Alcohol use: No  . Drug use: No  . Sexual activity: Not on file  Other Topics Concern  . Not on file  Social History Narrative  . Not on file   Social Determinants of Health   Financial Resource Strain: High Risk  . Difficulty of Paying Living Expenses: Very hard  Food Insecurity: No Food Insecurity  . Worried About Charity fundraiser in the Last Year: Never true  . Ran Out of Food in the Last Year: Never true  Transportation Needs: No Transportation Needs  . Lack of Transportation (Medical): No  .  Lack of Transportation (Non-Medical): No  Physical Activity:   . Days of Exercise per Week: Not on file  . Minutes of Exercise per Session: Not on file  Stress:   . Feeling of Stress : Not on file  Social Connections:   . Frequency of Communication with Friends and Family: Not on file  . Frequency of Social Gatherings with Friends and Family: Not on file  . Attends Religious Services: Not on file  . Active Member of Clubs or Organizations: Not on file  . Attends Archivist Meetings: Not on file  . Marital Status: Not on file  Intimate Partner Violence:   . Fear of Current or Ex-Partner: Not on file  . Emotionally Abused: Not on file  . Physically Abused: Not on file  . Sexually Abused: Not on file    Review of Systems: Gen: Denies any fever, chills,  fatigue, weight loss, lack of appetite.  CV: Denies chest pain, heart palpitations, peripheral edema, syncope.  Resp: Denies shortness of breath at rest or with exertion. Denies wheezing or cough.  GI: Denies dysphagia or odynophagia. Denies jaundice, hematemesis, fecal incontinence. GU : Denies urinary burning, urinary frequency, urinary hesitancy MS: Denies joint pain, muscle weakness, cramps, or limitation of movement.  Derm: Denies rash, itching, dry skin Psych: Denies depression, anxiety, memory loss, and confusion Heme: Denies bruising, bleeding, and enlarged lymph nodes.  Physical Exam: There were no vitals taken for this visit. General:   Alert and oriented. Pleasant and cooperative. Well-nourished and well-developed.  Head:  Normocephalic and atraumatic. Eyes:  Without icterus, sclera clear and conjunctiva pink.  Ears:  Normal auditory acuity. Nose:  No deformity, discharge,  or lesions. Mouth:  No deformity or lesions, oral mucosa pink.  Neck:  Supple, without mass or thyromegaly. Lungs:  Clear to auscultation bilaterally. No wheezes, rales, or rhonchi. No distress.  Heart:  S1, S2 present without murmurs appreciated.  Abdomen:  +BS, soft, non-tender and non-distended. No HSM noted. No guarding or rebound. No masses appreciated.  Rectal:  Deferred  Msk:  Symmetrical without gross deformities. Normal posture. Pulses:  Normal pulses noted. Extremities:  Without clubbing or edema. Neurologic:  Alert and  oriented x4;  grossly normal neurologically. Skin:  Intact without significant lesions or rashes. Cervical Nodes:  No significant cervical adenopathy. Psych:  Alert and cooperative. Normal mood and affect.

## 2020-09-21 ENCOUNTER — Ambulatory Visit: Payer: Medicare HMO | Admitting: Gastroenterology

## 2020-09-21 ENCOUNTER — Encounter: Payer: Self-pay | Admitting: Internal Medicine

## 2020-09-26 ENCOUNTER — Encounter (HOSPITAL_COMMUNITY)
Admission: RE | Admit: 2020-09-26 | Discharge: 2020-09-26 | Disposition: A | Discharge status: Home / Self Care | Payer: Medicare HMO | Source: Ambulatory Visit | Attending: Internal Medicine | Admitting: Internal Medicine

## 2020-09-26 ENCOUNTER — Encounter (HOSPITAL_COMMUNITY): Payer: Self-pay

## 2020-09-26 ENCOUNTER — Other Ambulatory Visit: Payer: Self-pay

## 2020-09-26 DIAGNOSIS — D509 Iron deficiency anemia, unspecified: Secondary | ICD-10-CM | POA: Diagnosis not present

## 2020-09-26 MED ORDER — SODIUM CHLORIDE 0.9 % IV SOLN
510.0000 mg | Freq: Once | INTRAVENOUS | Status: AC
  Administered 2020-09-26: 510 mg via INTRAVENOUS
  Filled 2020-09-26: qty 17

## 2020-09-26 MED ORDER — SODIUM CHLORIDE 0.9 % IV SOLN: Freq: Once | INTRAVENOUS | Status: AC

## 2020-10-02 ENCOUNTER — Other Ambulatory Visit: Payer: Self-pay

## 2020-10-02 NOTE — Patient Outreach (Signed)
[facility] [facility]) Care Management  10/02/2020  HAVOC SANLUIS Jan 10, 1946 [id number]   Telephone Assessment     Outreach attempt # 1 to patient. No answer. RN CM left HIPAA compliant voicemail message along with contact info.    Plan: RN CM will make quarterly outreach attempt to patient within the month of Dec if no return call from patient.  [name], RN,BSN,CCM [place] Management Telephonic Care Management Coordinator Direct Phone: [phone] Toll Free: [phone] Fax: [phone]

## 2020-10-02 NOTE — Patient Outreach (Signed)
[place] [facility]) Care Management  10/02/2020  William Sandoval [birth year] [id number]   Telephone Assessment   Incoming call from patient returning RN CM call. Patient shares that he has been doing fairly well. He reports that he has several MD appts upcoming within the next two month. He is going to see GI MD related to some eating issues he has been having. He report that he feels sick on his stomach whenever he eats/drinks things with sugar in it. H edenies any n&v, abd pain, diarrhea or other sxs. He has been eating about three meals/day in smaller portions. He reports that he continues to try to work/move his shoulder to help with ROM and stiffness. He goes for his one year post rotator cuff surgery appt soon as well.  Patient is due for flu shot. He states he will get it at MD appt this month. He will also make appt for COVID-19 booster dose as he is eligible. He denies any RN CM needs or concerns at this time.      Plan: RN CM discussed with patient next outreach within the month of  Dec. Patient gave verbal consent and in agreement with RN CM follow up and timeframe. Patient aware that they may contact RN CM sooner for any issues or concerns.  [name], RN,BSN,CCM [place] Management Telephonic Care Management Coordinator Direct Phone: [phone] Toll Free: [phone] Fax: [phone]

## 2020-10-06 DIAGNOSIS — R197 Diarrhea, unspecified: Secondary | ICD-10-CM | POA: Diagnosis not present

## 2020-10-06 DIAGNOSIS — L249 Irritant contact dermatitis, unspecified cause: Secondary | ICD-10-CM | POA: Diagnosis not present

## 2020-10-06 DIAGNOSIS — Z0001 Encounter for general adult medical examination with abnormal findings: Secondary | ICD-10-CM | POA: Diagnosis not present

## 2020-10-06 DIAGNOSIS — L309 Dermatitis, unspecified: Secondary | ICD-10-CM | POA: Diagnosis not present

## 2020-10-06 DIAGNOSIS — K580 Irritable bowel syndrome with diarrhea: Secondary | ICD-10-CM | POA: Diagnosis not present

## 2020-10-06 DIAGNOSIS — E7849 Other hyperlipidemia: Secondary | ICD-10-CM | POA: Diagnosis not present

## 2020-10-06 DIAGNOSIS — D6869 Other thrombophilia: Secondary | ICD-10-CM | POA: Diagnosis not present

## 2020-10-06 DIAGNOSIS — I482 Chronic atrial fibrillation, unspecified: Secondary | ICD-10-CM | POA: Diagnosis not present

## 2020-10-06 DIAGNOSIS — Z96612 Presence of left artificial shoulder joint: Secondary | ICD-10-CM | POA: Diagnosis not present

## 2020-10-10 DIAGNOSIS — I482 Chronic atrial fibrillation, unspecified: Secondary | ICD-10-CM | POA: Diagnosis not present

## 2020-10-10 DIAGNOSIS — I10 Essential (primary) hypertension: Secondary | ICD-10-CM | POA: Diagnosis not present

## 2020-10-10 DIAGNOSIS — M25512 Pain in left shoulder: Secondary | ICD-10-CM | POA: Diagnosis not present

## 2020-10-10 DIAGNOSIS — E039 Hypothyroidism, unspecified: Secondary | ICD-10-CM | POA: Diagnosis not present

## 2020-10-10 DIAGNOSIS — D509 Iron deficiency anemia, unspecified: Secondary | ICD-10-CM | POA: Diagnosis not present

## 2020-10-10 DIAGNOSIS — E782 Mixed hyperlipidemia: Secondary | ICD-10-CM | POA: Diagnosis not present

## 2020-10-10 DIAGNOSIS — L259 Unspecified contact dermatitis, unspecified cause: Secondary | ICD-10-CM | POA: Diagnosis not present

## 2020-10-10 DIAGNOSIS — R945 Abnormal results of liver function studies: Secondary | ICD-10-CM | POA: Diagnosis not present

## 2020-10-10 DIAGNOSIS — R197 Diarrhea, unspecified: Secondary | ICD-10-CM | POA: Diagnosis not present

## 2020-10-11 DIAGNOSIS — Z7901 Long term (current) use of anticoagulants: Secondary | ICD-10-CM | POA: Diagnosis not present

## 2020-10-11 DIAGNOSIS — I4891 Unspecified atrial fibrillation: Secondary | ICD-10-CM | POA: Diagnosis not present

## 2020-10-11 DIAGNOSIS — E7849 Other hyperlipidemia: Secondary | ICD-10-CM | POA: Diagnosis not present

## 2020-10-11 DIAGNOSIS — E039 Hypothyroidism, unspecified: Secondary | ICD-10-CM | POA: Diagnosis not present

## 2020-10-11 DIAGNOSIS — I10 Essential (primary) hypertension: Secondary | ICD-10-CM | POA: Diagnosis not present

## 2020-10-16 DIAGNOSIS — Z96612 Presence of left artificial shoulder joint: Secondary | ICD-10-CM | POA: Diagnosis not present

## 2020-11-01 ENCOUNTER — Encounter: Payer: Self-pay | Admitting: *Deleted

## 2020-11-01 ENCOUNTER — Telehealth: Payer: Self-pay | Admitting: *Deleted

## 2020-11-01 ENCOUNTER — Encounter: Payer: Self-pay | Admitting: Gastroenterology

## 2020-11-01 ENCOUNTER — Other Ambulatory Visit: Payer: Self-pay

## 2020-11-01 ENCOUNTER — Ambulatory Visit: Payer: Medicare HMO | Admitting: Gastroenterology

## 2020-11-01 VITALS — BP 150/74 | HR 58 | Temp 98.2°F | Ht 73.0 in | Wt 212.0 lb

## 2020-11-01 DIAGNOSIS — R194 Change in bowel habit: Secondary | ICD-10-CM | POA: Insufficient documentation

## 2020-11-01 DIAGNOSIS — R1310 Dysphagia, unspecified: Secondary | ICD-10-CM | POA: Diagnosis not present

## 2020-11-01 DIAGNOSIS — D509 Iron deficiency anemia, unspecified: Secondary | ICD-10-CM

## 2020-11-01 DIAGNOSIS — Z0001 Encounter for general adult medical examination with abnormal findings: Secondary | ICD-10-CM | POA: Diagnosis not present

## 2020-11-01 DIAGNOSIS — R197 Diarrhea, unspecified: Secondary | ICD-10-CM | POA: Diagnosis not present

## 2020-11-01 DIAGNOSIS — I482 Chronic atrial fibrillation, unspecified: Secondary | ICD-10-CM | POA: Diagnosis not present

## 2020-11-01 DIAGNOSIS — D6869 Other thrombophilia: Secondary | ICD-10-CM | POA: Diagnosis not present

## 2020-11-01 DIAGNOSIS — E7849 Other hyperlipidemia: Secondary | ICD-10-CM | POA: Diagnosis not present

## 2020-11-01 DIAGNOSIS — Z96612 Presence of left artificial shoulder joint: Secondary | ICD-10-CM | POA: Diagnosis not present

## 2020-11-01 DIAGNOSIS — R634 Abnormal weight loss: Secondary | ICD-10-CM | POA: Diagnosis not present

## 2020-11-01 DIAGNOSIS — K580 Irritable bowel syndrome with diarrhea: Secondary | ICD-10-CM | POA: Diagnosis not present

## 2020-11-01 DIAGNOSIS — L249 Irritant contact dermatitis, unspecified cause: Secondary | ICD-10-CM | POA: Diagnosis not present

## 2020-11-01 DIAGNOSIS — L309 Dermatitis, unspecified: Secondary | ICD-10-CM | POA: Diagnosis not present

## 2020-11-01 NOTE — Patient Instructions (Signed)
1. Please have your labs done in the next 1-2 weeks. You can take them to [place] or ask your PCP's office to drawn with your upcoming labs that they have planned. 2. Colonoscopy and upper endoscopy as scheduled. See separate instructions. 3. Complete stool test and return to our office so that we can see if you are losing any blood in your stool.

## 2020-11-01 NOTE — Progress Notes (Signed)
CC'ED TO PCP 

## 2020-11-01 NOTE — H&P (View-Only) (Signed)
Primary Care Physician:  Celene Squibb, MD  Primary Gastroenterologist:  Elon Alas. Abbey Chatters, DO   Chief Complaint  Patient presents with  . loose stool    sometimes stools are dark  . Gas  . Weight Loss    lost over 30 lbs in past year; not able to eat as much; some foods don't taste same    HPI:  William Sandoval is a 74 y.o. male here at the request of Dr. Nevada Crane urgent office visit for possible GI bleed.  Referral came back in October, he was offered an urgent office visit.  He did not show for the appointment on October 21.  He presents today for further evaluation.  Patient states he found that was anemic a couple months ago.  Labs from October, hemoglobin 10.4, hematocrit 30.6, MCV 117,  Energy has been poor.  PCP arranged for Two iron infusions back in 09/2020. Hgb up a little since iron infusions but I do not have those labs. Also taking MVI and extra iron tablet.  He also has noted 30 pound weight loss over the past few years.  His mother passed away in Mar 22, 2015, now he lives alone.  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  Feels like this contributed to his weight loss especially in the beginning.  He weighed 240 pounds in 03/22/2015, 220 pounds in October 2020, 212 pounds in October 2021.  Weight has been stable the past 2 months.  Typically will go and grab a quick breakfast in the mornings if he has the money.  Otherwise grab something quick at home.  Sometimes opens a can of beans.  Denies any abdominal pain.  Bowel movements have been somewhat different the past year.  Can go couple of days without a bowel movement.  Other days may have a couple lobes loose stools.  Denies hard stools.  Prior to 1 year ago he had more regular solid bowel movements.  He denies any melena or rectal bleeding. No vomiting. Occasional nausea. No heartburn. Has some pill dysphagia. No ASA powders. Takes [employer] pain relief, not sure of the name.   No prior upper endoscopy or colonoscopy.  Current Outpatient Medications  Medication  Sig Dispense Refill  . acetaminophen (TYLENOL) 650 MG CR tablet Take 650-1,300 mg by mouth every 8 (eight) hours as needed for pain.    Marland Kitchen atorvastatin (LIPITOR) 40 MG tablet Take 40 mg by mouth daily.    . camphor-menthol (SARNA) lotion Apply 1 application topically as needed for itching.    . levothyroxine (SYNTHROID) 112 MCG tablet Take 112 mcg by mouth daily before breakfast.    . metoprolol (LOPRESSOR) 50 MG tablet Take 50 mg by mouth daily.     Marland Kitchen warfarin (COUMADIN) 5 MG tablet Take 5-7.5 mg by mouth See admin instructions. Take 1.5 tablets (7.5 mg) on Mondays & Fridays, then take 1 tablet (5 mg) by mouth on all other days.     No current facility-administered medications for this visit.    Allergies as of 11/01/2020 - Review Complete 11/01/2020  Allergen Reaction Noted  . Gluten meal Other (See Comments) 08/26/2015  . Shrimp [shellfish allergy] Other (See Comments) 08/26/2015  . Sertraline Rash 08/25/2015    Past Medical History:  Diagnosis Date  . Anemia   . Arthritis    shoulder,  . Chronic atrial fibrillation ([place])   . Dyslipidemia   . Dysrhythmia    AFib  . Epistaxis   . Hypertension  Past Surgical History:  Procedure Laterality Date  . ACOUSTIC NEUROMA RESECTION Left 1991   [facility]  . CARPAL TUNNEL RELEASE Bilateral   . REVERSE SHOULDER ARTHROPLASTY Left 09/16/2019   Procedure: REVERSE SHOULDER ARTHROPLASTY;  Surgeon: Justice Britain, MD;  Location: [facility];  Service: Orthopedics;  Laterality: Left;  122min  . SHOULDER SURGERY     Right    Family History  Problem Relation Age of Onset  . Heart attack Mother   . Colon cancer Neg Hx     Social History   Socioeconomic History  . Marital status: Single    Spouse name: Not on file  . Number of children: Not on file  . Years of education: Not on file  . Highest education level: Not on file  Occupational History  . 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 Smoking  status: Never Smoker  . Smokeless tobacco: Never Used  Vaping Use  . Vaping Use: Never used  Substance and Sexual Activity  . Alcohol use: No  . Drug use: No  . Sexual activity: Not on file  Other Topics Concern  . Not on file  Social History Narrative  . Not on file   Social Determinants of Health   Financial Resource Strain:   . Difficulty of Paying Living Expenses: Not on file  Food Insecurity: No Food Insecurity  . Worried About Charity fundraiser in the Last Year: Never true  . Ran Out of Food in the Last Year: Never true  Transportation Needs: No Transportation Needs  . Lack of Transportation (Medical): No  . Lack of Transportation (Non-Medical): No  Physical Activity:   . Days of Exercise per Week: Not on file  . Minutes of Exercise per Session: Not on file  Stress:   . Feeling of Stress : Not on file  Social Connections:   . Frequency of Communication with Friends and Family: Not on file  . Frequency of Social Gatherings with Friends and Family: Not on file  . Attends Religious Services: Not on file  . Active Member of Clubs or Organizations: Not on file  . Attends Archivist Meetings: Not on file  . Marital Status: Not on file  Intimate Partner Violence:   . Fear of Current or Ex-Partner: Not on file  . Emotionally Abused: Not on file  . Physically Abused: Not on file  . Sexually Abused: Not on file      ROS:  General: Negative for anorexia, fever, chills, fatigue, positive weakness.  See HPI Eyes: Negative for vision changes.  ENT: Negative for hoarseness,  nasal congestion.  See HPI CV: Negative for chest pain, angina, palpitations, dyspnea on exertion, peripheral edema.  Respiratory: Negative for dyspnea at rest, dyspnea on exertion, cough, sputum, wheezing.  GI: See history of present illness. GU:  Negative for dysuria, hematuria, urinary incontinence, urinary frequency, nocturnal urination.  MS: Negative for joint pain, low back pain.   Derm: Negative for rash or itching.  Neuro: Negative for weakness, abnormal sensation, seizure, frequent headaches, memory loss, confusion.  Psych: Negative for anxiety, depression, suicidal ideation, hallucinations.  Endo: See HPI Heme: Negative for bruising or bleeding. Allergy: Negative for rash or hives.    Physical Examination:  BP (!) 150/74   Pulse (!) 58   Temp 98.2 F (36.8 C) (Temporal)   Ht 6\' 1"  (1.854 m)   Wt 212 lb (96.2 kg)   BMI 27.97 kg/m  General: Well-nourished, well-developed in no acute distress.  Head: Normocephalic, atraumatic.   Eyes: Conjunctiva pink, no icterus. Mouth: masked Neck: Supple without thyromegaly, masses, or lymphadenopathy.  Lungs: Clear to auscultation bilaterally.  Heart: Regular rate and rhythm, no murmurs rubs or gallops.  Abdomen: Bowel sounds are normal, nontender, nondistended, no hepatosplenomegaly or masses, no abdominal bruits or    hernia , no rebound or guarding.   Rectal: not performed Extremities: No lower extremity edema. No clubbing or deformities.  Neuro: Alert and oriented x 4 , grossly normal neurologically.  Skin: Warm and dry, no rash or jaundice.   Psych: Alert and cooperative, normal mood and affect.  Labs: Labs from September 12, 2020: TSH 4.110, hemoglobin 10.4, hematocrit 30.6, MCV 117, platelets 225,000, white blood cell count 5600, BUN 17, creatinine 1.02, albumin 3.7, total bilirubin 0.8, alkaline phosphatase 127, AST 22, ALT 14, amylase 61, lipase 45.  Imaging Studies: No results found.  Impression/plan:  Pleasant 74 year old male with past medical history significant for hypertension, atrial fibrillation on Coumadin presenting for further evaluation of anemia and concern for GI bleeding.   Patient denies any melena or rectal bleeding.  He has noted change in bowel habits as described.  Labs available to me indicated macrocytic anemia.  Patient completed 2 iron infusions arranged by PCP. He also  describes slow gradual weight loss but states this has stabilized over the past couple of months. Appetite has been diminished. He also contributes weight loss to not really well versed in the kitchen, diet limited to eating out for breakfast and eating canned food at home. This may also be contributing to his anemia.  He also has some vague dysphagia, predominantly to pills.  We will plan on updating labs, check iron indices, B12 and folate due to macrocytic anemia. Screen for celiac disease. Check FOBT. Plan for colonoscopy, EGD plus or minus esophageal dilation with Dr. Abbey Chatters in the near future. ASA 2.  I have discussed the risks, alternatives, benefits with regards to but not limited to the risk of reaction to medication, bleeding, infection, perforation and the patient is agreeable to proceed. Written consent to be obtained.  He will hold his Coumadin 5 days prior to procedure. Further instructions for resuming Coumadin will be provided by Dr. Abbey Chatters after his procedure.

## 2020-11-01 NOTE — Progress Notes (Signed)
Primary Care Physician:  Celene Squibb, MD  Primary Gastroenterologist:  Elon Alas. Abbey Chatters, DO   Chief Complaint  Patient presents with  . loose stool    sometimes stools are dark  . Gas  . Weight Loss    lost over 30 lbs in past year; not able to eat as much; some foods don't taste same    HPI:  William Sandoval is a 74 y.o. male here at the request of Dr. Nevada Crane urgent office visit for possible GI bleed.  Referral came back in October, he was offered an urgent office visit.  He did not show for the appointment on October 21.  He presents today for further evaluation.  Patient states he found that was anemic a couple months ago.  Labs from October, hemoglobin 10.4, hematocrit 30.6, MCV 117,  Energy has been poor.  PCP arranged for Two iron infusions back in 09/2020. Hgb up a little since iron infusions but I do not have those labs. Also taking MVI and extra iron tablet.  He also has noted 30 pound weight loss over the past few years.  His mother passed away in 2015/02/23, now he lives alone.  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  Feels like this contributed to his weight loss especially in the beginning.  He weighed 240 pounds in 02/23/2015, 220 pounds in October 2020, 212 pounds in October 2021.  Weight has been stable the past 2 months.  Typically will go and grab a quick breakfast in the mornings if he has the money.  Otherwise grab something quick at home.  Sometimes opens a can of beans.  Denies any abdominal pain.  Bowel movements have been somewhat different the past year.  Can go couple of days without a bowel movement.  Other days may have a couple lobes loose stools.  Denies hard stools.  Prior to 1 year ago he had more regular solid bowel movements.  He denies any melena or rectal bleeding. No vomiting. Occasional nausea. No heartburn. Has some pill dysphagia. No ASA powders. Takes [employer] pain relief, not sure of the name.   No prior upper endoscopy or colonoscopy.  Current Outpatient Medications  Medication  Sig Dispense Refill  . acetaminophen (TYLENOL) 650 MG CR tablet Take 650-1,300 mg by mouth every 8 (eight) hours as needed for pain.    Marland Kitchen atorvastatin (LIPITOR) 40 MG tablet Take 40 mg by mouth daily.    . camphor-menthol (SARNA) lotion Apply 1 application topically as needed for itching.    . levothyroxine (SYNTHROID) 112 MCG tablet Take 112 mcg by mouth daily before breakfast.    . metoprolol (LOPRESSOR) 50 MG tablet Take 50 mg by mouth daily.     Marland Kitchen warfarin (COUMADIN) 5 MG tablet Take 5-7.5 mg by mouth See admin instructions. Take 1.5 tablets (7.5 mg) on Mondays & Fridays, then take 1 tablet (5 mg) by mouth on all other days.     No current facility-administered medications for this visit.    Allergies as of 11/01/2020 - Review Complete 11/01/2020  Allergen Reaction Noted  . Gluten meal Other (See Comments) 08/26/2015  . Shrimp [shellfish allergy] Other (See Comments) 08/26/2015  . Sertraline Rash 08/25/2015    Past Medical History:  Diagnosis Date  . Anemia   . Arthritis    shoulder,  . Chronic atrial fibrillation ([place])   . Dyslipidemia   . Dysrhythmia    AFib  . Epistaxis   . Hypertension  Past Surgical History:  Procedure Laterality Date  . ACOUSTIC NEUROMA RESECTION Left 1991   [facility]  . CARPAL TUNNEL RELEASE Bilateral   . REVERSE SHOULDER ARTHROPLASTY Left 09/16/2019   Procedure: REVERSE SHOULDER ARTHROPLASTY;  Surgeon: Justice Britain, MD;  Location: [facility];  Service: Orthopedics;  Laterality: Left;  177min  . SHOULDER SURGERY     Right    Family History  Problem Relation Age of Onset  . Heart attack Mother   . Colon cancer Neg Hx     Social History   Socioeconomic History  . Marital status: Single    Spouse name: Not on file  . Number of children: Not on file  . Years of education: Not on file  . Highest education level: Not on file  Occupational History  . 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 Smoking  status: Never Smoker  . Smokeless tobacco: Never Used  Vaping Use  . Vaping Use: Never used  Substance and Sexual Activity  . Alcohol use: No  . Drug use: No  . Sexual activity: Not on file  Other Topics Concern  . Not on file  Social History Narrative  . Not on file   Social Determinants of Health   Financial Resource Strain:   . Difficulty of Paying Living Expenses: Not on file  Food Insecurity: No Food Insecurity  . Worried About Charity fundraiser in the Last Year: Never true  . Ran Out of Food in the Last Year: Never true  Transportation Needs: No Transportation Needs  . Lack of Transportation (Medical): No  . Lack of Transportation (Non-Medical): No  Physical Activity:   . Days of Exercise per Week: Not on file  . Minutes of Exercise per Session: Not on file  Stress:   . Feeling of Stress : Not on file  Social Connections:   . Frequency of Communication with Friends and Family: Not on file  . Frequency of Social Gatherings with Friends and Family: Not on file  . Attends Religious Services: Not on file  . Active Member of Clubs or Organizations: Not on file  . Attends Archivist Meetings: Not on file  . Marital Status: Not on file  Intimate Partner Violence:   . Fear of Current or Ex-Partner: Not on file  . Emotionally Abused: Not on file  . Physically Abused: Not on file  . Sexually Abused: Not on file      ROS:  General: Negative for anorexia, fever, chills, fatigue, positive weakness.  See HPI Eyes: Negative for vision changes.  ENT: Negative for hoarseness,  nasal congestion.  See HPI CV: Negative for chest pain, angina, palpitations, dyspnea on exertion, peripheral edema.  Respiratory: Negative for dyspnea at rest, dyspnea on exertion, cough, sputum, wheezing.  GI: See history of present illness. GU:  Negative for dysuria, hematuria, urinary incontinence, urinary frequency, nocturnal urination.  MS: Negative for joint pain, low back pain.   Derm: Negative for rash or itching.  Neuro: Negative for weakness, abnormal sensation, seizure, frequent headaches, memory loss, confusion.  Psych: Negative for anxiety, depression, suicidal ideation, hallucinations.  Endo: See HPI Heme: Negative for bruising or bleeding. Allergy: Negative for rash or hives.    Physical Examination:  BP (!) 150/74   Pulse (!) 58   Temp 98.2 F (36.8 C) (Temporal)   Ht 6\' 1"  (1.854 m)   Wt 212 lb (96.2 kg)   BMI 27.97 kg/m  General: Well-nourished, well-developed in no acute distress.  Head: Normocephalic, atraumatic.   Eyes: Conjunctiva pink, no icterus. Mouth: masked Neck: Supple without thyromegaly, masses, or lymphadenopathy.  Lungs: Clear to auscultation bilaterally.  Heart: Regular rate and rhythm, no murmurs rubs or gallops.  Abdomen: Bowel sounds are normal, nontender, nondistended, no hepatosplenomegaly or masses, no abdominal bruits or    hernia , no rebound or guarding.   Rectal: not performed Extremities: No lower extremity edema. No clubbing or deformities.  Neuro: Alert and oriented x 4 , grossly normal neurologically.  Skin: Warm and dry, no rash or jaundice.   Psych: Alert and cooperative, normal mood and affect.  Labs: Labs from September 12, 2020: TSH 4.110, hemoglobin 10.4, hematocrit 30.6, MCV 117, platelets 225,000, white blood cell count 5600, BUN 17, creatinine 1.02, albumin 3.7, total bilirubin 0.8, alkaline phosphatase 127, AST 22, ALT 14, amylase 61, lipase 45.  Imaging Studies: No results found.  Impression/plan:  Pleasant 74 year old male with past medical history significant for hypertension, atrial fibrillation on Coumadin presenting for further evaluation of anemia and concern for GI bleeding.   Patient denies any melena or rectal bleeding.  He has noted change in bowel habits as described.  Labs available to me indicated macrocytic anemia.  Patient completed 2 iron infusions arranged by PCP. He also  describes slow gradual weight loss but states this has stabilized over the past couple of months. Appetite has been diminished. He also contributes weight loss to not really well versed in the kitchen, diet limited to eating out for breakfast and eating canned food at home. This may also be contributing to his anemia.  He also has some vague dysphagia, predominantly to pills.  We will plan on updating labs, check iron indices, B12 and folate due to macrocytic anemia. Screen for celiac disease. Check FOBT. Plan for colonoscopy, EGD plus or minus esophageal dilation with Dr. Abbey Chatters in the near future. ASA 2.  I have discussed the risks, alternatives, benefits with regards to but not limited to the risk of reaction to medication, bleeding, infection, perforation and the patient is agreeable to proceed. Written consent to be obtained.  He will hold his Coumadin 5 days prior to procedure. Further instructions for resuming Coumadin will be provided by Dr. Abbey Chatters after his procedure.

## 2020-11-01 NOTE — Telephone Encounter (Signed)
PA done via [employer] and was approved for procedures. Auth# [id number] dates 11/20/2020-12/20/2020

## 2020-11-03 ENCOUNTER — Telehealth: Payer: Self-pay

## 2020-11-03 NOTE — Telephone Encounter (Signed)
Lab results requested at pts ov were faxed over from [place] completed labs at Dr. Juel Burrow office. Results placed in Neil Crouch, PA's office.

## 2020-11-06 ENCOUNTER — Encounter: Payer: Self-pay | Admitting: Gastroenterology

## 2020-11-06 ENCOUNTER — Other Ambulatory Visit: Payer: Self-pay

## 2020-11-06 DIAGNOSIS — R194 Change in bowel habit: Secondary | ICD-10-CM

## 2020-11-06 DIAGNOSIS — R7989 Other specified abnormal findings of blood chemistry: Secondary | ICD-10-CM

## 2020-11-06 DIAGNOSIS — R634 Abnormal weight loss: Secondary | ICD-10-CM

## 2020-11-06 NOTE — Telephone Encounter (Signed)
Lm with PCP office, will call back to discuss B12 level.

## 2020-11-06 NOTE — Telephone Encounter (Signed)
Labs completed 11/01/2020: Iron 91, TIBC 186 low, iron saturations 49, ferritin 685 high, white blood cell count 7200, hemoglobin 12.6 low, hematocrit 37.4 low, MCV 120 high, platelets 2 34,000, B12 low at 73, folate 19.3, TTG IgA less than 2, IgA level 206.   1# Please let pt know his H/H are better but still slightly low. Celiac screen negative. Iron ok.   2# His B12 is very low. This can cause neurological issues, neuropathy, anemia, etc.  He needs to see PCP for B12 deficiency, will likely need injections. Please let Dr. Juel Burrow nurse know we are advising they manage his B12 deficiency.  3# His ferritin is high, can be seen with inflammation/infection, iron overload. WOULD RECOMMEND RECHECKING FERRITIN IN FOUR WEEKS.  4# Colonoscopy and upper endoscopy as planned.

## 2020-11-06 NOTE — Telephone Encounter (Signed)
Spoke with pt. Pt was notified of results. Pt is going to discuss B12 deficiency with his PCP. Lab orders placed for repeat ferritin in 4 weeks.

## 2020-11-07 ENCOUNTER — Other Ambulatory Visit (INDEPENDENT_AMBULATORY_CARE_PROVIDER_SITE_OTHER): Payer: Medicare HMO

## 2020-11-07 DIAGNOSIS — R634 Abnormal weight loss: Secondary | ICD-10-CM

## 2020-11-07 DIAGNOSIS — R194 Change in bowel habit: Secondary | ICD-10-CM | POA: Diagnosis not present

## 2020-11-07 LAB — IFOBT (OCCULT BLOOD): IFOBT: NEGATIVE

## 2020-11-07 NOTE — Progress Notes (Signed)
IFOBT

## 2020-11-08 DIAGNOSIS — E782 Mixed hyperlipidemia: Secondary | ICD-10-CM | POA: Diagnosis not present

## 2020-11-08 DIAGNOSIS — E039 Hypothyroidism, unspecified: Secondary | ICD-10-CM | POA: Diagnosis not present

## 2020-11-08 DIAGNOSIS — R945 Abnormal results of liver function studies: Secondary | ICD-10-CM | POA: Diagnosis not present

## 2020-11-08 DIAGNOSIS — M25512 Pain in left shoulder: Secondary | ICD-10-CM | POA: Diagnosis not present

## 2020-11-08 DIAGNOSIS — I482 Chronic atrial fibrillation, unspecified: Secondary | ICD-10-CM | POA: Diagnosis not present

## 2020-11-08 DIAGNOSIS — L259 Unspecified contact dermatitis, unspecified cause: Secondary | ICD-10-CM | POA: Diagnosis not present

## 2020-11-08 DIAGNOSIS — D509 Iron deficiency anemia, unspecified: Secondary | ICD-10-CM | POA: Diagnosis not present

## 2020-11-08 DIAGNOSIS — R197 Diarrhea, unspecified: Secondary | ICD-10-CM | POA: Diagnosis not present

## 2020-11-08 DIAGNOSIS — I10 Essential (primary) hypertension: Secondary | ICD-10-CM | POA: Diagnosis not present

## 2020-11-08 NOTE — Telephone Encounter (Signed)
Noted  

## 2020-11-09 NOTE — Telephone Encounter (Signed)
William Sandoval, Spoke with [employer] [facility] at Dr. Juel Burrow office. Dr. Juel Burrow has ordered B12 injections for pt. Pt will be starting injections soon.

## 2020-11-10 DIAGNOSIS — D519 Vitamin B12 deficiency anemia, unspecified: Secondary | ICD-10-CM | POA: Diagnosis not present

## 2020-11-13 ENCOUNTER — Other Ambulatory Visit: Payer: Self-pay

## 2020-11-13 DIAGNOSIS — R7989 Other specified abnormal findings of blood chemistry: Secondary | ICD-10-CM

## 2020-11-16 ENCOUNTER — Encounter (HOSPITAL_COMMUNITY): Payer: Self-pay

## 2020-11-16 DIAGNOSIS — D519 Vitamin B12 deficiency anemia, unspecified: Secondary | ICD-10-CM | POA: Diagnosis not present

## 2020-11-17 ENCOUNTER — Other Ambulatory Visit (HOSPITAL_COMMUNITY)
Admission: RE | Admit: 2020-11-17 | Discharge: 2020-11-17 | Disposition: A | Discharge status: Home / Self Care | Payer: Medicare HMO | Source: Ambulatory Visit | Attending: Internal Medicine | Admitting: Internal Medicine

## 2020-11-17 ENCOUNTER — Other Ambulatory Visit: Payer: Self-pay

## 2020-11-17 DIAGNOSIS — Z01812 Encounter for preprocedural laboratory examination: Secondary | ICD-10-CM | POA: Diagnosis not present

## 2020-11-17 DIAGNOSIS — Z20822 Contact with and (suspected) exposure to covid-19: Secondary | ICD-10-CM | POA: Insufficient documentation

## 2020-11-17 LAB — SARS CORONAVIRUS 2 (TAT 6-24 HRS): SARS Coronavirus 2: NEGATIVE

## 2020-11-20 ENCOUNTER — Ambulatory Visit (HOSPITAL_COMMUNITY): Payer: Medicare HMO | Admitting: Anesthesiology

## 2020-11-20 ENCOUNTER — Ambulatory Visit (HOSPITAL_COMMUNITY)
Admission: RE | Admit: 2020-11-20 | Discharge: 2020-11-20 | Disposition: A | Discharge status: Home / Self Care | Payer: Medicare HMO | Attending: Internal Medicine | Admitting: Internal Medicine

## 2020-11-20 ENCOUNTER — Other Ambulatory Visit: Payer: Self-pay

## 2020-11-20 ENCOUNTER — Encounter (HOSPITAL_COMMUNITY)
Admission: RE | Disposition: A | Discharge status: Home / Self Care | Payer: Self-pay | Source: Home / Self Care | Attending: Internal Medicine

## 2020-11-20 DIAGNOSIS — Z79899 Other long term (current) drug therapy: Secondary | ICD-10-CM | POA: Diagnosis not present

## 2020-11-20 DIAGNOSIS — K2289 Other specified disease of esophagus: Secondary | ICD-10-CM | POA: Diagnosis not present

## 2020-11-20 DIAGNOSIS — E785 Hyperlipidemia, unspecified: Secondary | ICD-10-CM | POA: Diagnosis not present

## 2020-11-20 DIAGNOSIS — K648 Other hemorrhoids: Secondary | ICD-10-CM | POA: Insufficient documentation

## 2020-11-20 DIAGNOSIS — Z91013 Allergy to seafood: Secondary | ICD-10-CM | POA: Diagnosis not present

## 2020-11-20 DIAGNOSIS — I482 Chronic atrial fibrillation, unspecified: Secondary | ICD-10-CM | POA: Insufficient documentation

## 2020-11-20 DIAGNOSIS — K3189 Other diseases of stomach and duodenum: Secondary | ICD-10-CM | POA: Diagnosis not present

## 2020-11-20 DIAGNOSIS — I10 Essential (primary) hypertension: Secondary | ICD-10-CM | POA: Diagnosis not present

## 2020-11-20 DIAGNOSIS — Z8249 Family history of ischemic heart disease and other diseases of the circulatory system: Secondary | ICD-10-CM | POA: Diagnosis not present

## 2020-11-20 DIAGNOSIS — D509 Iron deficiency anemia, unspecified: Secondary | ICD-10-CM | POA: Insufficient documentation

## 2020-11-20 DIAGNOSIS — Z7901 Long term (current) use of anticoagulants: Secondary | ICD-10-CM | POA: Diagnosis not present

## 2020-11-20 DIAGNOSIS — K9041 Non-celiac gluten sensitivity: Secondary | ICD-10-CM | POA: Diagnosis not present

## 2020-11-20 DIAGNOSIS — K219 Gastro-esophageal reflux disease without esophagitis: Secondary | ICD-10-CM | POA: Diagnosis not present

## 2020-11-20 DIAGNOSIS — K5730 Diverticulosis of large intestine without perforation or abscess without bleeding: Secondary | ICD-10-CM | POA: Insufficient documentation

## 2020-11-20 DIAGNOSIS — E039 Hypothyroidism, unspecified: Secondary | ICD-10-CM | POA: Diagnosis not present

## 2020-11-20 DIAGNOSIS — Z888 Allergy status to other drugs, medicaments and biological substances status: Secondary | ICD-10-CM | POA: Diagnosis not present

## 2020-11-20 DIAGNOSIS — R1310 Dysphagia, unspecified: Secondary | ICD-10-CM | POA: Insufficient documentation

## 2020-11-20 HISTORY — PX: COLONOSCOPY WITH PROPOFOL: SHX5780

## 2020-11-20 HISTORY — PX: ESOPHAGOGASTRODUODENOSCOPY (EGD) WITH PROPOFOL: SHX5813

## 2020-11-20 HISTORY — PX: BIOPSY: SHX5522

## 2020-11-20 LAB — PROTIME-INR
INR: 1.4 — ABNORMAL HIGH (ref 0.8–1.2)
Prothrombin Time: 17 seconds — ABNORMAL HIGH (ref 11.4–15.2)

## 2020-11-20 SURGERY — COLONOSCOPY WITH PROPOFOL
Anesthesia: General

## 2020-11-20 MED ORDER — LACTATED RINGERS IV SOLN: INTRAVENOUS | Status: DC | PRN

## 2020-11-20 MED ORDER — STERILE WATER FOR IRRIGATION IR SOLN
Status: DC | PRN
  Administered 2020-11-20: 12:00:00 1.5 mL

## 2020-11-20 MED ORDER — PROPOFOL 10 MG/ML IV BOLUS
INTRAVENOUS | Status: DC | PRN
  Administered 2020-11-20: 50 mg via INTRAVENOUS
  Administered 2020-11-20: 100 mg via INTRAVENOUS

## 2020-11-20 MED ORDER — LIDOCAINE HCL (CARDIAC) PF 100 MG/5ML IV SOSY
PREFILLED_SYRINGE | INTRAVENOUS | Status: DC | PRN
  Administered 2020-11-20: 50 mg via INTRATRACHEAL

## 2020-11-20 MED ORDER — LIDOCAINE VISCOUS HCL 2 % MT SOLN
15.0000 mL | Freq: Once | OROMUCOSAL | Status: AC
  Administered 2020-11-20: 12:00:00 15 mL via OROMUCOSAL

## 2020-11-20 MED ORDER — LACTATED RINGERS IV SOLN: Freq: Once | INTRAVENOUS | Status: AC

## 2020-11-20 MED ORDER — PROPOFOL 500 MG/50ML IV EMUL
INTRAVENOUS | Status: DC | PRN
  Administered 2020-11-20: 100 ug/kg/min via INTRAVENOUS

## 2020-11-20 MED ORDER — LIDOCAINE VISCOUS HCL 2 % MT SOLN
OROMUCOSAL | Status: AC
  Filled 2020-11-20: qty 15

## 2020-11-20 MED ORDER — PROPOFOL 10 MG/ML IV BOLUS
INTRAVENOUS | Status: AC
  Filled 2020-11-20: qty 40

## 2020-11-20 NOTE — Transfer of Care (Signed)
Immediate Anesthesia Transfer of Care Note  Patient: William Sandoval  Procedure(s) Performed: COLONOSCOPY WITH PROPOFOL (N/A ) ESOPHAGOGASTRODUODENOSCOPY (EGD) WITH PROPOFOL (N/A ) BIOPSY  Patient Location: PACU  Anesthesia Type:General  Level of Consciousness: awake, alert  and oriented  Airway & Oxygen Therapy: Patient Spontanous Breathing  Post-op Assessment: Report given to RN, Post -op Vital signs reviewed and stable and Patient moving all extremities X 4  Post vital signs: Reviewed and stable  Last Vitals:  Vitals Value Taken Time  BP    Temp    Pulse    Resp    SpO2      Last Pain:  Vitals:   11/20/20 1154  TempSrc:   PainSc: 0-No pain      Patients Stated Pain Goal: 2 (37/50/51 0712)  Complications: No complications documented.

## 2020-11-20 NOTE — Interval H&P Note (Signed)
History and Physical Interval Note:  11/20/2020 11:07 AM  William Sandoval  has presented today for surgery, with the diagnosis of change in bowels, anemia, weight loss, dypshagia.  The various methods of treatment have been discussed with the patient and family. After consideration of risks, benefits and other options for treatment, the patient has consented to  Procedure(s) with comments: COLONOSCOPY WITH PROPOFOL (N/A) - 12:00pm ESOPHAGOGASTRODUODENOSCOPY (EGD) WITH PROPOFOL (N/A) BALLOON DILATION (N/A) as a surgical intervention.  The patient's history has been reviewed, patient examined, no change in status, stable for surgery.  I have reviewed the patient's chart and labs.  Questions were answered to the patient's satisfaction.     Eloise Harman

## 2020-11-20 NOTE — Op Note (Signed)
[facility] Patient Name: William Sandoval Procedure Date: 11/20/2020 12:08 PM MRN: [id number] Date of Birth: Oct 01, 1946 Attending MD: Elon Alas. Abbey Chatters DO CSN: [id number] Age: 74 Admit Type: Outpatient Procedure:                Colonoscopy Indications:              Iron deficiency anemia Providers:                Elon Alas. Abbey Chatters, DO, Caprice Kluver, Casimer Bilis, Technician Referring MD:              Medicines:                See the Anesthesia note for documentation of the                            administered medications Complications:            No immediate complications. Estimated Blood Loss:     Estimated blood loss: none. Procedure:                Pre-Anesthesia Assessment:                           - The anesthesia plan was to use monitored                            anesthesia care (MAC).                           After obtaining informed consent, the colonoscope                            was passed under direct vision. Throughout the                            procedure, the patient's blood pressure, pulse, and                            oxygen saturations were monitored continuously. The                            PCF-H190DL ([id number]) scope was introduced through                            the anus and advanced to the the cecum, identified                            by appendiceal orifice and ileocecal valve. The                            colonoscopy was technically difficult and complex                            due to a redundant colon and significant looping.  Successful completion of the procedure was aided by                            applying abdominal pressure. The patient tolerated                            the procedure well. The quality of the bowel                            preparation was evaluated using the BBPS [facility]                            Bowel Preparation Scale) with scores of:  Right                            Colon = 2 (minor amount of residual staining, small                            fragments of stool and/or opaque liquid, but mucosa                            seen well), Transverse Colon = 3 (entire mucosa                            seen well with no residual staining, small                            fragments of stool or opaque liquid) and Left Colon                            = 3 (entire mucosa seen well with no residual                            staining, small fragments of stool or opaque                            liquid). The total BBPS score equals 8. The quality                            of the bowel preparation was good. Scope In: 12:10:01 PM Scope Out: 12:32:35 PM Scope Withdrawal Time: 0 hours 9 minutes 16 seconds  Total Procedure Duration: 0 hours 22 minutes 34 seconds  Findings:      The perianal and digital rectal examinations were normal.      Non-bleeding internal hemorrhoids were found during endoscopy.      Multiple small and large-mouthed diverticula were found in the sigmoid       colon and descending colon.      The colon (entire examined portion) revealed significantly excessive       looping. Advancing the scope required applying abdominal pressure.      The exam was otherwise without abnormality. Impression:               - Non-bleeding internal hemorrhoids.                           -  Diverticulosis in the sigmoid colon and in the                            descending colon.                           - There was significant looping of the colon.                           - The examination was otherwise normal.                           - No specimens collected. Moderate Sedation:      Per Anesthesia Care Recommendation:           - Patient has a contact number available for                            emergencies. The signs and symptoms of potential                            delayed complications were discussed with the                             patient. Return to normal activities tomorrow.                            Written discharge instructions were provided to the                            patient.                           - Resume previous diet.                           - Continue present medications.                           - Repeat colonoscopy in 10 years for screening                            purposes.                           - Return to GI clinic in 3 months. Procedure Code(s):        --- Professional ---                           [phone], Colonoscopy, flexible; diagnostic, including                            collection of specimen(s) by brushing or washing,                            when performed (separate procedure) Diagnosis Code(s):        --- Professional ---  K64.8, Other hemorrhoids                           D50.9, Iron deficiency anemia, unspecified                           K57.30, Diverticulosis of large intestine without                            perforation or abscess without bleeding CPT copyright 2019 American Medical Association. All rights reserved. The codes documented in this report are preliminary and upon coder review may  be revised to meet current compliance requirements. Elon Alas. Abbey Chatters, DO Wayne Abbey Chatters, DO 11/20/2020 12:38:01 PM This report has been signed electronically. Number of Addenda: 0

## 2020-11-20 NOTE — Patient Outreach (Signed)
[facility]) Care Management  11/20/2020  ASHANTI LITTLES Oct 09, 1946 [id number]   Telephone Assessment   Unsuccessful outreach attempt to patient. Noted patient having scheduled procedure this morning.        Plan: RN CM will make outreach attempt to patient within the month of Jan.  Jaelle Campanile Verl [facility] [place] [facility] Telephonic Care Management Coordinator Direct Phone: [phone] Toll Free: [phone] Fax: [phone]

## 2020-11-20 NOTE — Discharge Instructions (Addendum)
EGD Discharge instructions Please read the instructions outlined below and refer to this sheet in the next few weeks. These discharge instructions provide you with general information on caring for yourself after you leave the hospital. Your doctor may also give you specific instructions. While your treatment has been planned according to the most current medical practices available, unavoidable complications occasionally occur. If you have any problems or questions after discharge, please call your doctor. ACTIVITY  You may resume your regular activity but move at a slower pace for the next 24 hours.   Take frequent rest periods for the next 24 hours.   Walking will help expel (get rid of) the air and reduce the bloated feeling in your abdomen.   No driving for 24 hours (because of the anesthesia (medicine) used during the test).   You may shower.   Do not sign any important legal documents or operate any machinery for 24 hours (because of the anesthesia used during the test).  NUTRITION  Drink plenty of fluids.   You may resume your normal diet.   Avoid alcoholic beverages for 24 hours or as instructed by your caregiver.  MEDICATIONS  You may resume your normal medications unless your caregiver tells you otherwise.  WHAT YOU CAN EXPECT TODAY  You may experience abdominal discomfort such as a feeling of fullness or "gas" pains.  FOLLOW-UP  Your doctor will discuss the results of your test with you.  SEEK IMMEDIATE MEDICAL ATTENTION IF ANY OF THE FOLLOWING OCCUR:  Excessive nausea (feeling sick to your stomach) and/or vomiting.   Severe abdominal pain and distention (swelling).   Trouble swallowing.   Temperature over 101 F (37.8 C).   Rectal bleeding or vomiting of blood.     Colonoscopy Discharge Instructions  Read the instructions outlined below and refer to this sheet in the next few weeks. These discharge instructions provide you with general information on  caring for yourself after you leave the hospital. Your doctor may also give you specific instructions. While your treatment has been planned according to the most current medical practices available, unavoidable complications occasionally occur.   ACTIVITY  You may resume your regular activity, but move at a slower pace for the next 24 hours.   Take frequent rest periods for the next 24 hours.   Walking will help get rid of the air and reduce the bloated feeling in your belly (abdomen).   No driving for 24 hours (because of the medicine (anesthesia) used during the test).    Do not sign any important legal documents or operate any machinery for 24 hours (because of the anesthesia used during the test).  NUTRITION  Drink plenty of fluids.   You may resume your normal diet    Avoid alcoholic beverages for 24 hours or as instructed.  MEDICATIONS  You may resume your normal medications unless your doctor tells you otherwise.  WHAT YOU CAN EXPECT TODAY  Some feelings of bloating in the abdomen.   Passage of more gas than usual.   Spotting of blood in your stool or on the toilet paper.  FINDING OUT THE RESULTS OF YOUR TEST Not all test results are available during your visit. If your test results are not back during the visit, make an appointment with your caregiver to find out the results. Do not assume everything is normal if you have not heard from your caregiver or the medical facility. It is important for you to follow up on all of  your test results.  SEEK IMMEDIATE MEDICAL ATTENTION IF:  You have more than a spotting of blood in your stool.   Your belly is swollen (abdominal distention).   You are nauseated or vomiting.   You have a temperature over 101.   You have abdominal pain or discomfort that is severe or gets worse throughout the day.   Your EGD was relatively unremarkable.  There was findings at the end portion of your esophagus consistent with Barrett's esophagus.   I biopsied this and we should get these results back by next week.  I also biopsied your small bowel to rule out celiac disease which can cause iron deficiency anemia.   Colonoscopy was unremarkable.  I did not find any colon polyps or colon cancer.  Recommend repeating in 10 years for screening purposes.  Follow-up with GI in 3 months or sooner if needed.  I hope you have a great rest of your week!  Elon Alas. Abbey Chatters, D.O. Gastroenterology and Hepatology [facility]

## 2020-11-20 NOTE — Anesthesia Preprocedure Evaluation (Signed)
Anesthesia Evaluation  Patient identified by MRN, date of birth, ID band Patient awake    Reviewed: Allergy & Precautions, NPO status , Patient's Chart, lab work & pertinent test results  History of Anesthesia Complications Negative for: history of anesthetic complications  Airway Mallampati: I  TM Distance: >3 FB Neck ROM: Full    Dental  (+) Dental Advisory Given, Missing   Pulmonary neg pulmonary ROS,    Pulmonary exam normal breath sounds clear to auscultation       Cardiovascular hypertension, Pt. on medications + dysrhythmias Atrial Fibrillation  Rhythm:Irregular Rate:Bradycardia  07-Sep-2019 12:24:53 [facility]-PRE ROUTINE RECORD Atrial fibrillation with slow ventricular response Incomplete right bundle branch block Inferior infarct , age undetermined Cannot rule out Anterior infarct , age undetermined No significant change since last tracing Confirmed by Croitoru, Mihai [phone]) on 09/07/2019 8:38:18 PM 39mm/s 64mm/mV 100Hz  9.0.4 12SL 241 HD CID: [id number] Referred by: JOHN HALL Confirmed By: Dani Gobble Croitoru   Neuro/Psych negative neurological ROS  negative psych ROS   GI/Hepatic negative GI ROS, Neg liver ROS,   Endo/Other  Hypothyroidism   Renal/GU negative Renal ROS     Musculoskeletal  (+) Arthritis ,   Abdominal   Peds  Hematology  (+) anemia ,   Anesthesia Other Findings   Reproductive/Obstetrics                             Anesthesia Physical Anesthesia Plan  ASA: III  Anesthesia Plan: General   Post-op Pain Management:    Induction: Intravenous  PONV Risk Score and Plan: TIVA  Airway Management Planned: Nasal Cannula and Natural Airway  Additional Equipment:   Intra-op Plan:   Post-operative Plan:   Informed Consent: I have reviewed the patients History and Physical, chart, labs and discussed the procedure including the risks, benefits and  alternatives for the proposed anesthesia with the patient or authorized representative who has indicated his/her understanding and acceptance.     Dental advisory given  Plan Discussed with: CRNA and Surgeon  Anesthesia Plan Comments: (Took warfarin 2 days ago, Will check INR before the procedure)        Anesthesia Quick Evaluation

## 2020-11-20 NOTE — Anesthesia Postprocedure Evaluation (Signed)
Anesthesia Post Note  Patient: William Sandoval  Procedure(s) Performed: COLONOSCOPY WITH PROPOFOL (N/A ) ESOPHAGOGASTRODUODENOSCOPY (EGD) WITH PROPOFOL (N/A ) BIOPSY  Patient location during evaluation: Endoscopy Anesthesia Type: General Level of consciousness: awake and alert Pain management: pain level controlled Vital Signs Assessment: post-procedure vital signs reviewed and stable Respiratory status: spontaneous breathing, nonlabored ventilation, respiratory function stable and patient connected to nasal cannula oxygen Cardiovascular status: blood pressure returned to baseline and stable Postop Assessment: no apparent nausea or vomiting Anesthetic complications: no   No complications documented.   Last Vitals:  Vitals:   11/20/20 1035 11/20/20 1237  BP: 140/82 130/73  Pulse: 71 75  Resp: 16 19  Temp: 36.6 C 36.7 C  SpO2: 100% 99%    Last Pain:  Vitals:   11/20/20 1237  TempSrc: Oral  PainSc: 0-No pain                 Adair Patter Rhonna Holster

## 2020-11-20 NOTE — Op Note (Signed)
[facility] Patient Name: William Sandoval Procedure Date: 11/20/2020 11:46 AM MRN: [id number] Date of Birth: 1946-03-17 Attending MD: Elon Alas. Edgar Frisk CSN: [id number] Age: 74 Admit Type: Outpatient Procedure:                Upper GI endoscopy Indications:              Iron deficiency anemia, Dysphagia Providers:                Elon Alas. Abbey Chatters, DO, Lambert Mody, Caprice Kluver, Casimer Bilis, Technician, Nelma Rothman,                            Technician Referring MD:              Medicines:                See the Anesthesia note for documentation of the                            administered medications Complications:            No immediate complications. Estimated Blood Loss:     Estimated blood loss was minimal. Procedure:                Pre-Anesthesia Assessment:                           - The anesthesia plan was to use monitored                            anesthesia care (MAC).                           After obtaining informed consent, the endoscope was                            passed under direct vision. Throughout the                            procedure, the patient's blood pressure, pulse, and                            oxygen saturations were monitored continuously. The                            GIF-H190 ([id number]) scope was introduced through the                            mouth, and advanced to the second part of duodenum.                            The upper GI endoscopy was accomplished without                            difficulty. The patient tolerated the  procedure                            well. Scope In: 12:02:25 PM Scope Out: 12:05:34 PM Total Procedure Duration: 0 hours 3 minutes 9 seconds  Findings:      The Z-line was irregular and was found 40 cm from the incisors. Biopsies       were taken with a cold forceps for histology.      The entire examined stomach was normal.      The duodenal bulb, first portion  of the duodenum and second portion of       the duodenum were normal. Biopsies for histology were taken with a cold       forceps for evaluation of celiac disease. Impression:               - Z-line irregular, 40 cm from the incisors.                            Biopsied.                           - Normal stomach.                           - Normal duodenal bulb, first portion of the                            duodenum and second portion of the duodenum.                            Biopsied. Moderate Sedation:      Per Anesthesia Care Recommendation:           - Patient has a contact number available for                            emergencies. The signs and symptoms of potential                            delayed complications were discussed with the                            patient. Return to normal activities tomorrow.                            Written discharge instructions were provided to the                            patient.                           - Resume previous diet.                           - Continue present medications.                           - Await pathology results.                           -  Return to GI clinic in 3 months. Procedure Code(s):        --- Professional ---                           [id number], Esophagogastroduodenoscopy, flexible,                            transoral; with biopsy, single or multiple Diagnosis Code(s):        --- Professional ---                           K22.8, Other specified diseases of esophagus                           D50.9, Iron deficiency anemia, unspecified                           R13.10, Dysphagia, unspecified CPT copyright 2019 American Medical Association. All rights reserved. The codes documented in this report are preliminary and upon coder review may  be revised to meet current compliance requirements. Elon Alas. Abbey Chatters, DO Wilson Abbey Chatters, DO 11/20/2020 12:32:44 PM This report has been signed  electronically. Number of Addenda: 0

## 2020-11-21 ENCOUNTER — Telehealth: Payer: Self-pay | Admitting: Internal Medicine

## 2020-11-21 LAB — SURGICAL PATHOLOGY

## 2020-11-21 MED ORDER — OMEPRAZOLE 20 MG PO CPDR: 20.0000 mg | DELAYED_RELEASE_CAPSULE | Freq: Two times a day (BID) | ORAL | 5 refills | Status: DC

## 2020-11-21 NOTE — Telephone Encounter (Signed)
Omeprazole BID sent to pharmacy

## 2020-11-22 DIAGNOSIS — R7989 Other specified abnormal findings of blood chemistry: Secondary | ICD-10-CM | POA: Diagnosis not present

## 2020-11-22 NOTE — Telephone Encounter (Signed)
Noted  

## 2020-11-23 LAB — FERRITIN: Ferritin: 606 ng/mL — ABNORMAL HIGH (ref 30–400)

## 2020-11-27 DIAGNOSIS — D519 Vitamin B12 deficiency anemia, unspecified: Secondary | ICD-10-CM | POA: Diagnosis not present

## 2020-11-28 ENCOUNTER — Encounter (HOSPITAL_COMMUNITY): Payer: Self-pay | Admitting: Internal Medicine

## 2020-11-29 ENCOUNTER — Other Ambulatory Visit: Payer: Self-pay

## 2020-11-29 DIAGNOSIS — R634 Abnormal weight loss: Secondary | ICD-10-CM

## 2020-11-29 DIAGNOSIS — D509 Iron deficiency anemia, unspecified: Secondary | ICD-10-CM

## 2020-11-29 DIAGNOSIS — R194 Change in bowel habit: Secondary | ICD-10-CM

## 2020-11-29 NOTE — Progress Notes (Signed)
OPEN IN ERROR 

## 2020-12-01 DIAGNOSIS — D519 Vitamin B12 deficiency anemia, unspecified: Secondary | ICD-10-CM | POA: Diagnosis not present

## 2020-12-01 DIAGNOSIS — E7849 Other hyperlipidemia: Secondary | ICD-10-CM | POA: Diagnosis not present

## 2020-12-01 DIAGNOSIS — K580 Irritable bowel syndrome with diarrhea: Secondary | ICD-10-CM | POA: Diagnosis not present

## 2020-12-01 DIAGNOSIS — I10 Essential (primary) hypertension: Secondary | ICD-10-CM | POA: Diagnosis not present

## 2020-12-04 DIAGNOSIS — H5200 Hypermetropia, unspecified eye: Secondary | ICD-10-CM | POA: Diagnosis not present

## 2020-12-04 DIAGNOSIS — Z0100 Encounter for examination of eyes and vision without abnormal findings: Secondary | ICD-10-CM | POA: Diagnosis not present

## 2020-12-08 DIAGNOSIS — D519 Vitamin B12 deficiency anemia, unspecified: Secondary | ICD-10-CM | POA: Diagnosis not present

## 2020-12-18 ENCOUNTER — Other Ambulatory Visit: Payer: Self-pay

## 2020-12-18 NOTE — Patient Outreach (Signed)
[facility]) Care Management  12/18/2020  William Sandoval 21-Aug-1946 [id number]   Telephone Assessment   Successful outreach attempt to patient. Patient voices that he is doing fairly well. He shares with RN CM his continued testing and recent colonoscopy. He voices that no polyps or cancer were found. He will go back to see GI MD in March. He has been started on Omeprazole. Patient denies any heartburn or GI sxs except feeling full and not being able to eat large portions. He has had some wgt loss. Patient voices he is drinking supplemental drinks to help with calorie/nutrient intake as well. Patient voices that his shoulder is doing fairly well. He tells that the cold weather is here and he has more stiffness to area. He remains limited on what and how much he can use it but denies any pain at present.He denies any RN CM needs or concerns at this time.   Medications Reviewed Today    Reviewed by [name], RN (Registered Nurse) on 12/18/20 at 1134  Med List Status: <None>  Medication Order Taking? Sig Documenting Provider Last Dose Status Informant  acetaminophen (TYLENOL) 650 MG CR tablet [id number]  Take 650-1,300 mg by mouth every 8 (eight) hours as needed for pain. [provider]  Active Self           Med Note Sharene Butters   Tue Nov 14, 2020  3:26 PM) rarely  atorvastatin (LIPITOR) 40 MG tablet [id number]  Take 40 mg by mouth daily. [provider]  Active Self  camphor-menthol Timoteo Ace) lotion [id number]  Apply 1 application topically as needed for itching. [provider]  Active Self  ferrous sulfate 325 (65 FE) MG tablet [id number]  Take 325 mg by mouth daily with breakfast. [provider]  Active Self  levothyroxine (SYNTHROID) 112 MCG tablet [id number]  Take 112 mcg by mouth daily before breakfast. [provider]  Active Self  loratadine (CLARITIN) 10 MG tablet [id number]  Take 20 mg by mouth daily.  [provider]  Active Self  metoprolol succinate (TOPROL-XL) 50 MG 24 hr tablet [id number]  Take 50 mg by mouth daily. [provider]  Active Self  Multiple Vitamin (MULTIVITAMIN WITH MINERALS) TABS tablet [id number]  Take 1 tablet by mouth daily. VitaFusion Men's Multivitamin [provider]  Active Self  mupirocin cream (BACTROBAN) 2 % [id number]  Apply 1 application topically 2 (two) times daily as needed (wound care). [provider]  Active Self  omeprazole (PRILOSEC) 20 MG capsule [id number]  Take 1 capsule (20 mg total) by mouth 2 (two) times daily before a meal. Eloise Harman, DO  Active   triamcinolone (KENALOG) 0.1 % [id number]  Apply 1 application topically 2 (two) times daily as needed (rash). [provider]  Active Self  warfarin (COUMADIN) 5 MG tablet [id number]  Take 5-7.5 mg by mouth See admin instructions. Take 1.5 tablets (7.5 mg) on Mondays, Wednesdays, Fridays, & Saturdays then take 1 tablet (5 mg) by mouth on all other days. [provider]  Active Self           Goals Addressed            This Visit's Progress   . Make and Keep All Appointments       Timeframe:  Long-Range Goal Priority:  High Start Date:   10/02/2020  Expected End Date:   March 2022                   Follow Up Date March 2022   - keep a calendar with prescription refill dates - keep a calendar with appointment dates  Patient will complete GI MD appt.   Why is this important?   Part of staying healthy is seeing the doctor for follow-up care.  If you forget your appointments, there are some things you can do to stay on track.    Notes:  12/18/20-Patient complete colonoscopy and goes for GI f/u in March.     . Protect My Health       Timeframe:  Long-Range Goal Priority:  High Start Date:   10/02/2020                          Expected End Date:  March 2022                    Follow Up Date March 2022   -  schedule appointment for flu shot - schedule appointment for vaccines needed due to my age or health(COVID-19 vaccine)    Why is this important?   Screening tests can find diseases early when they are easier to treat.  Your doctor or nurse will talk with you about which tests are important for you.  Getting shots for common diseases like the flu and shingles will help prevent them.     Notes:       Plan: RN CM will send quarterly update to PCP.  RN CM discussed with patient next outreach within the month of  March. Patient gave verbal consent and in agreement with RN CM follow up and timeframe. Patient aware that they may contact RN CM sooner for any issues or concerns. RN CM reviewed goals and plan of care with patient. Patient in agreement.   [name], RN,BSN,CCM [facility] Telephonic Care Management Coordinator Direct Phone: [phone] Toll Free: [phone] Fax: [phone]

## 2020-12-21 ENCOUNTER — Ambulatory Visit: Payer: Self-pay

## 2020-12-25 DIAGNOSIS — D519 Vitamin B12 deficiency anemia, unspecified: Secondary | ICD-10-CM | POA: Diagnosis not present

## 2020-12-30 DIAGNOSIS — E039 Hypothyroidism, unspecified: Secondary | ICD-10-CM | POA: Diagnosis not present

## 2020-12-30 DIAGNOSIS — I10 Essential (primary) hypertension: Secondary | ICD-10-CM | POA: Diagnosis not present

## 2020-12-30 DIAGNOSIS — I4891 Unspecified atrial fibrillation: Secondary | ICD-10-CM | POA: Diagnosis not present

## 2020-12-30 DIAGNOSIS — E7849 Other hyperlipidemia: Secondary | ICD-10-CM | POA: Diagnosis not present

## 2020-12-30 DIAGNOSIS — Z7901 Long term (current) use of anticoagulants: Secondary | ICD-10-CM | POA: Diagnosis not present

## 2021-01-03 ENCOUNTER — Encounter: Payer: Self-pay | Admitting: *Deleted

## 2021-01-09 DIAGNOSIS — D6869 Other thrombophilia: Secondary | ICD-10-CM | POA: Diagnosis not present

## 2021-01-09 DIAGNOSIS — I482 Chronic atrial fibrillation, unspecified: Secondary | ICD-10-CM | POA: Diagnosis not present

## 2021-01-09 DIAGNOSIS — Z0001 Encounter for general adult medical examination with abnormal findings: Secondary | ICD-10-CM | POA: Diagnosis not present

## 2021-01-09 DIAGNOSIS — L309 Dermatitis, unspecified: Secondary | ICD-10-CM | POA: Diagnosis not present

## 2021-01-09 DIAGNOSIS — R197 Diarrhea, unspecified: Secondary | ICD-10-CM | POA: Diagnosis not present

## 2021-01-09 DIAGNOSIS — Z96612 Presence of left artificial shoulder joint: Secondary | ICD-10-CM | POA: Diagnosis not present

## 2021-01-09 DIAGNOSIS — L249 Irritant contact dermatitis, unspecified cause: Secondary | ICD-10-CM | POA: Diagnosis not present

## 2021-01-09 DIAGNOSIS — E7849 Other hyperlipidemia: Secondary | ICD-10-CM | POA: Diagnosis not present

## 2021-01-09 DIAGNOSIS — K580 Irritable bowel syndrome with diarrhea: Secondary | ICD-10-CM | POA: Diagnosis not present

## 2021-01-16 NOTE — Patient Instructions (Signed)
CBC, Iron and TIBC, hereditary hemochromatosis, and ferritin results placed on William Sandoval's desk.

## 2021-01-18 DIAGNOSIS — D519 Vitamin B12 deficiency anemia, unspecified: Secondary | ICD-10-CM | POA: Diagnosis not present

## 2021-01-29 DIAGNOSIS — E782 Mixed hyperlipidemia: Secondary | ICD-10-CM | POA: Diagnosis not present

## 2021-01-29 DIAGNOSIS — K219 Gastro-esophageal reflux disease without esophagitis: Secondary | ICD-10-CM | POA: Diagnosis not present

## 2021-01-29 DIAGNOSIS — I10 Essential (primary) hypertension: Secondary | ICD-10-CM | POA: Diagnosis not present

## 2021-02-02 DIAGNOSIS — K219 Gastro-esophageal reflux disease without esophagitis: Secondary | ICD-10-CM | POA: Diagnosis not present

## 2021-02-02 DIAGNOSIS — D519 Vitamin B12 deficiency anemia, unspecified: Secondary | ICD-10-CM | POA: Diagnosis not present

## 2021-02-08 NOTE — Progress Notes (Signed)
Labs dated 01/09/2021:  White blood cell count 8000, hemoglobin 14.2, hematocrit 42.2, MCV 101, platelets 239,000, iron 63, iron saturation is 34%, TIBC slightly low at 188, ferritin remained elevated at 547 (down from 606), hereditary hemochromatosis screen negative for mutation.   Patient needs to keep office visit scheduled upcoming later this month.  Will discuss further management of elevated ferritin.  Likely will not need further work-up, simply follow in light of recent iron infusions but he needs to be seen for reevaluation to determine next step.

## 2021-02-09 NOTE — Progress Notes (Signed)
Phoned and LM on vm of the pt to return call for results and instructions.

## 2021-02-13 DIAGNOSIS — D519 Vitamin B12 deficiency anemia, unspecified: Secondary | ICD-10-CM | POA: Diagnosis not present

## 2021-02-16 NOTE — Progress Notes (Signed)
Pt has appt Monday the 21st and I will print off this page and it can be addressed Monday at appt

## 2021-02-16 NOTE — Progress Notes (Signed)
Phoned and LM for the pt to return call 

## 2021-02-19 ENCOUNTER — Encounter: Payer: Self-pay | Admitting: Gastroenterology

## 2021-02-19 ENCOUNTER — Telehealth: Payer: Self-pay | Admitting: Gastroenterology

## 2021-02-19 ENCOUNTER — Other Ambulatory Visit: Payer: Self-pay

## 2021-02-19 ENCOUNTER — Ambulatory Visit: Payer: Medicare HMO | Admitting: Gastroenterology

## 2021-02-19 VITALS — BP 143/80 | HR 57 | Temp 96.9°F | Ht 73.0 in | Wt 215.0 lb

## 2021-02-19 DIAGNOSIS — R7989 Other specified abnormal findings of blood chemistry: Secondary | ICD-10-CM | POA: Diagnosis not present

## 2021-02-19 DIAGNOSIS — R634 Abnormal weight loss: Secondary | ICD-10-CM

## 2021-02-19 DIAGNOSIS — K319 Disease of stomach and duodenum, unspecified: Secondary | ICD-10-CM

## 2021-02-19 DIAGNOSIS — D649 Anemia, unspecified: Secondary | ICD-10-CM | POA: Diagnosis not present

## 2021-02-19 NOTE — Telephone Encounter (Signed)
Patient seen in office today and provided labs to take to PCP and have drawn in 04/2021. I have had to add on Celiac Disease HLA testing. Can you print lab order and mail to patient? Please let him know that he needs to take both sets of orders when he goes in May for labs. Thanks.

## 2021-02-19 NOTE — Patient Instructions (Signed)
1. Decrease omeprazole to 20 mg 30 minutes before breakfast daily. 2. Please take your blood work with you in May when you get Dr. Juel Burrow blood work done. 3. Continue to monitor weight at home, call if any further weight loss. 4. Return to the office in 4 months.

## 2021-02-19 NOTE — Telephone Encounter (Signed)
Noted   Labs printed and pt is advised and form in the mail to the pt.

## 2021-02-19 NOTE — Progress Notes (Signed)
Primary Care Physician: Celene Squibb, MD  Primary Gastroenterologist:  Elon Alas. Abbey Chatters, DO   Chief Complaint  Patient presents with  . Anemia    Pp f/u, doing ok. Stopped Iron    HPI: William Sandoval is a 75 y.o. male here with history of hypertension, A. fib on Coumadin presenting for follow-up of anemia, query GI bleeding, loose stools and weight loss.  Patient seen back in December.    In October 2021 PCP noted his hemoglobin was 10.4, MCV 117.  PCP arrange for 2 iron infusions October 2021.  Patient contributed weight loss due to his mother passed away in 2015/02/01, he lives alone and does not like to cook.  Weight 240 pounds in 02/01/15, 220 pounds in October 2020, 212 pounds in October 2021, 215 pounds today.  Labs back in December 2021: Celiac serologies negative, B12 low at 73, folate 19.3, iron 91, ferritin 685, TIBC 186, hemoglobin 12.6, MCV 120.  Repeat ferritin November 22, 2020 was 606.  Query related to iron infusions.  Repeat labs January 09, 2021 with hemoglobin of 14.2, MCV 101, iron 63, iron saturation 34%, TIBC slightly low at 188, ferritin remained elevated at 547, HFE genetics negative for mutation.  Colonoscopy November 20, 2020: -Non-bleeding internal hemorrhoids. - Diverticulosis in the sigmoid colon and in the descending colon. - There was significant looping of the colon. - The examination was otherwise normal. - No specimens collected.  EGD November 20, 2020: - Z-line irregular, 40 cm from the incisors. Biopsied (reflux changes). - Normal stomach. - Normal duodenal bulb, first portion of the duodenum and second portion of the duodenum. Biopsied (increased intraepithelial lymphocytes but no significant villous blunting, possible gluten sensitivity versus peptic injury versus NSAID injury versus H. Pylori).  Today: Overall feels fine.  Receiving monthly B12 injections through PCP now.  Patient stopped oral iron 1 month ago.  States he never really felt  heartburn even though his endoscopy showed reflux changes on biopsy.  Denies abdominal pain.  No dysphagia.  Bowel movements regular.  No melena or rectal bleeding.  Has more labs planned in May with Dr. Nevada Crane.    Current Outpatient Medications  Medication Sig Dispense Refill  . acetaminophen (TYLENOL) 650 MG CR tablet Take 650-1,300 mg by mouth every 8 (eight) hours as needed for pain.    Marland Kitchen atorvastatin (LIPITOR) 40 MG tablet Take 40 mg by mouth daily.    . camphor-menthol (SARNA) lotion Apply 1 application topically as needed for itching.    . Cyanocobalamin (B-12 COMPLIANCE INJECTION IJ) Inject as directed.    Marland Kitchen levothyroxine (SYNTHROID) 112 MCG tablet Take 112 mcg by mouth daily before breakfast.    . loratadine (CLARITIN) 10 MG tablet Take 20 mg by mouth daily.    . metoprolol succinate (TOPROL-XL) 50 MG 24 hr tablet Take 50 mg by mouth daily.    . Multiple Vitamin (MULTIVITAMIN WITH MINERALS) TABS tablet Take 1 tablet by mouth daily. VitaFusion Men's Multivitamin    . mupirocin cream (BACTROBAN) 2 % Apply 1 application topically 2 (two) times daily as needed (wound care).    Marland Kitchen omeprazole (PRILOSEC) 20 MG capsule Take 1 capsule (20 mg total) by mouth 2 (two) times daily before a meal. 60 capsule 5  . triamcinolone (KENALOG) 0.1 % Apply 1 application topically 2 (two) times daily as needed (rash).    . warfarin (COUMADIN) 5 MG tablet Take 5-7.5 mg by mouth See admin instructions. Take 1.5 tablets (  7.5 mg) on Mondays, Wednesdays, Fridays, & Saturdays then take 1 tablet (5 mg) by mouth on all other days.     No current facility-administered medications for this visit.    Allergies as of 02/19/2021 - Review Complete 02/19/2021  Allergen Reaction Noted  . Gluten meal Other (See Comments) 08/26/2015  . Shrimp [shellfish allergy] Other (See Comments) 08/26/2015  . Sertraline Rash 08/25/2015    ROS:  General: Negative for anorexia, weight loss, fever, chills, fatigue, weakness. See  hpi ENT: Negative for hoarseness, difficulty swallowing , nasal congestion. CV: Negative for chest pain, angina, palpitations, dyspnea on exertion, peripheral edema.  Respiratory: Negative for dyspnea at rest, dyspnea on exertion, cough, sputum, wheezing.  GI: See history of present illness. GU:  Negative for dysuria, hematuria, urinary incontinence, urinary frequency, nocturnal urination.  Endo: Negative for unusual weight change.    Physical Examination:   BP (!) 143/80   Pulse (!) 57   Temp (!) 96.9 F (36.1 C) (Temporal)   Ht _0  (1.854 m)   Wt 215 lb (97.5 kg)   BMI 28.37 kg/m   General: Well-nourished, well-developed in no acute distress.  Eyes: No icterus. Mouth: masked. Lungs: Clear to auscultation bilaterally.  Heart: Regular rate and rhythm, no murmurs rubs or gallops.  Abdomen: Bowel sounds are normal, nontender, nondistended, no hepatosplenomegaly or masses, no abdominal bruits or hernia , no rebound or guarding.   Extremities: No lower extremity edema. No clubbing or deformities. Neuro: Alert and oriented x 4   Skin: Warm and dry, no jaundice.   Psych: Alert and cooperative, normal mood and affect.  Labs:  See hpi  Imaging Studies: No results found.  Assessment:  Pleasant 75 year old male with history of hypertension, A. fib on Coumadin seen back in December for anemia and concern for possible occult GI bleeding (stool was heme-negative, no melena/brbpr).  He was noted to have macrocytic anemia.  Patient received 2 iron infusions arranged by PCP (preinfusion iron studies not available to me).  We ordered additional labs for macrocytic anemia and he was noted to have significant B12 deficiency, iron normal at that time.  Ferritin was significantly elevated, unclear if iron infusions contributed (not usually a significant increase in ferritin from 2 iron infusions) or other source.  Has been slowly improving though remains significantly elevated.  HFE genetic  markers were negative for mutation.  At this time his hemoglobin has normalized.  His MCV is improving.  His weight loss has stabilized and in fact he has gained a few pounds.  Without GI complaints.  Upper endoscopy with abnormal small bowel mucosa on biopsy (endoscopically appeared normal) showing intraepithelial lymphocytes which can be seen with celiac/peptic/H. pylori/NSAID related.  Previous celiac serologies negative.  Patient remains on regular diet at this time, stating he was unaware of recommendation to avoid gluten.  Esophageal biopsy consistent with reflux, patient does not heartburn.  Possibly silent reflux.  Plan:  1. Decrease omeprazole 20 mg daily before breakfast. 2. Check H. pylori serologies, iron/TIBC/ferritin, HLA DQ2/DQ8  when he goes to see Dr. Nevada Crane in May. 3. Continue to monitor weight at home, call with any further weight loss. 4. Return to the office in 4 months to see Dr. Abbey Chatters.

## 2021-02-20 ENCOUNTER — Other Ambulatory Visit: Payer: Self-pay

## 2021-02-20 DIAGNOSIS — D649 Anemia, unspecified: Secondary | ICD-10-CM | POA: Diagnosis not present

## 2021-02-20 DIAGNOSIS — R7989 Other specified abnormal findings of blood chemistry: Secondary | ICD-10-CM | POA: Diagnosis not present

## 2021-02-20 DIAGNOSIS — R634 Abnormal weight loss: Secondary | ICD-10-CM | POA: Diagnosis not present

## 2021-02-20 NOTE — Patient Outreach (Signed)
[facility] [facility]) Care Management  02/20/2021  KHALIF STENDER 07/15/1946 [id number]   Telephone Assessment Quarterly Call    Unsuccessful  Outreach attempt to patient. No answer at present. RN CM left HIPAA compliant voicemail along with contact info.      Plan: RN CM will make outreach attempt to patient within the month of May if no return call from patient.   [name], RN,BSN,CCM [place] [facility] Telephonic Care Management Coordinator Direct Phone: [phone] Toll Free: [phone] Fax: [phone]

## 2021-02-22 ENCOUNTER — Ambulatory Visit: Payer: Self-pay

## 2021-02-28 DIAGNOSIS — I10 Essential (primary) hypertension: Secondary | ICD-10-CM | POA: Diagnosis not present

## 2021-02-28 DIAGNOSIS — E1165 Type 2 diabetes mellitus with hyperglycemia: Secondary | ICD-10-CM | POA: Diagnosis not present

## 2021-03-19 DIAGNOSIS — D519 Vitamin B12 deficiency anemia, unspecified: Secondary | ICD-10-CM | POA: Diagnosis not present

## 2021-04-01 DIAGNOSIS — K219 Gastro-esophageal reflux disease without esophagitis: Secondary | ICD-10-CM | POA: Diagnosis not present

## 2021-04-01 DIAGNOSIS — E039 Hypothyroidism, unspecified: Secondary | ICD-10-CM | POA: Diagnosis not present

## 2021-04-01 DIAGNOSIS — I10 Essential (primary) hypertension: Secondary | ICD-10-CM | POA: Diagnosis not present

## 2021-04-01 DIAGNOSIS — D509 Iron deficiency anemia, unspecified: Secondary | ICD-10-CM | POA: Diagnosis not present

## 2021-04-01 DIAGNOSIS — E1165 Type 2 diabetes mellitus with hyperglycemia: Secondary | ICD-10-CM | POA: Diagnosis not present

## 2021-04-01 DIAGNOSIS — R55 Syncope and collapse: Secondary | ICD-10-CM | POA: Diagnosis not present

## 2021-04-01 DIAGNOSIS — E538 Deficiency of other specified B group vitamins: Secondary | ICD-10-CM | POA: Diagnosis not present

## 2021-04-01 DIAGNOSIS — E782 Mixed hyperlipidemia: Secondary | ICD-10-CM | POA: Diagnosis not present

## 2021-04-01 DIAGNOSIS — I482 Chronic atrial fibrillation, unspecified: Secondary | ICD-10-CM | POA: Diagnosis not present

## 2021-04-01 DIAGNOSIS — R945 Abnormal results of liver function studies: Secondary | ICD-10-CM | POA: Diagnosis not present

## 2021-04-03 DIAGNOSIS — R197 Diarrhea, unspecified: Secondary | ICD-10-CM | POA: Diagnosis not present

## 2021-04-03 DIAGNOSIS — Z0001 Encounter for general adult medical examination with abnormal findings: Secondary | ICD-10-CM | POA: Diagnosis not present

## 2021-04-03 DIAGNOSIS — K219 Gastro-esophageal reflux disease without esophagitis: Secondary | ICD-10-CM | POA: Diagnosis not present

## 2021-04-03 DIAGNOSIS — D6869 Other thrombophilia: Secondary | ICD-10-CM | POA: Diagnosis not present

## 2021-04-03 DIAGNOSIS — L309 Dermatitis, unspecified: Secondary | ICD-10-CM | POA: Diagnosis not present

## 2021-04-03 DIAGNOSIS — I482 Chronic atrial fibrillation, unspecified: Secondary | ICD-10-CM | POA: Diagnosis not present

## 2021-04-03 DIAGNOSIS — Z96612 Presence of left artificial shoulder joint: Secondary | ICD-10-CM | POA: Diagnosis not present

## 2021-04-03 DIAGNOSIS — E7849 Other hyperlipidemia: Secondary | ICD-10-CM | POA: Diagnosis not present

## 2021-04-03 DIAGNOSIS — L249 Irritant contact dermatitis, unspecified cause: Secondary | ICD-10-CM | POA: Diagnosis not present

## 2021-04-04 DIAGNOSIS — Z7901 Long term (current) use of anticoagulants: Secondary | ICD-10-CM | POA: Diagnosis not present

## 2021-04-04 DIAGNOSIS — D519 Vitamin B12 deficiency anemia, unspecified: Secondary | ICD-10-CM | POA: Diagnosis not present

## 2021-04-04 DIAGNOSIS — E782 Mixed hyperlipidemia: Secondary | ICD-10-CM | POA: Diagnosis not present

## 2021-04-04 DIAGNOSIS — D649 Anemia, unspecified: Secondary | ICD-10-CM | POA: Diagnosis not present

## 2021-04-04 DIAGNOSIS — E7849 Other hyperlipidemia: Secondary | ICD-10-CM | POA: Diagnosis not present

## 2021-04-04 DIAGNOSIS — I10 Essential (primary) hypertension: Secondary | ICD-10-CM | POA: Diagnosis not present

## 2021-04-04 DIAGNOSIS — E039 Hypothyroidism, unspecified: Secondary | ICD-10-CM | POA: Diagnosis not present

## 2021-04-04 DIAGNOSIS — E1165 Type 2 diabetes mellitus with hyperglycemia: Secondary | ICD-10-CM | POA: Diagnosis not present

## 2021-04-04 DIAGNOSIS — I959 Hypotension, unspecified: Secondary | ICD-10-CM | POA: Diagnosis not present

## 2021-04-05 ENCOUNTER — Other Ambulatory Visit: Payer: Self-pay

## 2021-04-05 NOTE — Patient Outreach (Signed)
[place] [facility]) Care Management  04/05/2021  William Sandoval 10-29-1946 [id number]   Telephone Assessment   Unsuccessful outreach attempt to patient. RN CM left HIPAA compliant voicemail message along with contact info.     Plan: RN CM will make outreach attempt to patient within the month of July if no return call.   [name], RN,BSN,CCM [place] [facility] Telephonic Care Management Coordinator Direct Phone: [phone] Toll Free: [phone] Fax: [phone]

## 2021-04-05 NOTE — Patient Outreach (Signed)
[facility] [facility]) Care Management  04/05/2021  William Sandoval 1946/02/27 [id number]   Telephone Assessment   Incoming call from patient returnign RN CM call. Patient reports he is doing well all things considered. He has gotten a new phone and is still trying to learn ho to use it and he has missed RN CM calls as a result. He shares that he went to the MD a few days ago and had lab work done. He is awaiting lab results.    Medications Reviewed Today    Reviewed by [name], RN (Registered Nurse) on 04/05/21 at 1125  Med List Status: <None>  Medication Order Taking? Sig Documenting Provider Last Dose Status Informant  acetaminophen (TYLENOL) 650 MG CR tablet [id number] No Take 650-1,300 mg by mouth every 8 (eight) hours as needed for pain. [provider] Taking Active Self           Med Note Vinie Sill Nov 14, 2020  3:26 PM) rarely  atorvastatin (LIPITOR) 40 MG tablet [id number] No Take 40 mg by mouth daily. [provider] Taking Active Self  camphor-menthol Timoteo Ace) lotion [id number] No Apply 1 application topically as needed for itching. [provider] Taking Active Self  Cyanocobalamin (B-12 COMPLIANCE INJECTION IJ) [id number] No Inject as directed. [provider] Taking Active Self  levothyroxine (SYNTHROID) 112 MCG tablet [id number] No Take 112 mcg by mouth daily before breakfast. [provider] Taking Active Self  loratadine (CLARITIN) 10 MG tablet [id number] No Take 20 mg by mouth daily. [provider] Taking Active Self  metoprolol succinate (TOPROL-XL) 50 MG 24 hr tablet [id number] No Take 50 mg by mouth daily. [provider] Taking Active Self  Multiple Vitamin (MULTIVITAMIN WITH MINERALS) TABS tablet [id number] No Take 1 tablet by mouth daily. VitaFusion Men's Multivitamin [provider] Taking Active Self  mupirocin cream (BACTROBAN) 2 % [id number] No Apply 1  application topically 2 (two) times daily as needed (wound care). [provider] Taking Active Self  omeprazole (PRILOSEC) 20 MG capsule [id number]  Take 1 capsule (20 mg total) by mouth daily. Mahala Menghini, PA-C  Active   triamcinolone (KENALOG) 0.1 % [id number] No Apply 1 application topically 2 (two) times daily as needed (rash). [provider] Taking Active Self  warfarin (COUMADIN) 5 MG tablet [id number] No Take 5-7.5 mg by mouth See admin instructions. Take 1.5 tablets (7.5 mg) on Mondays, Wednesdays, Fridays, & Saturdays then take 1 tablet (5 mg) by mouth on all other days. [provider] Taking Active Self         Goals Addressed              This Visit's Progress   .  (THN)Make and Keep All Appointments (pt-stated)        Timeframe:  Long-Range Goal Priority:  High Start Date:   10/02/2020                          Expected End Date:   July 2022                   Follow Up Date July 2022   - keep a calendar with prescription refill dates - keep a calendar with appointment dates  Patient will complete GI MD appt.   Why is this important?   Part of staying healthy is seeing the doctor for follow-up care.  If you forget your  appointments, there are some things you can do to stay on track.    Notes:  12/18/20-Patient complete colonoscopy and goes for GI f/u in March.  04/05/21-Patient reports that he continues to have ongoing GI work up and appts to determine wgt loss and anemia issues. He states that he feels fine. Appetite remains good. Wgt stable at 205lbs.    .  COMPLETED: (THN)Protect My Health (pt-stated)        Timeframe:  Long-Range Goal Priority:  High Start Date:   10/02/2020                          Expected End Date:  March 2022                    Follow Up Date March 2022   - schedule appointment for flu shot - schedule appointment for vaccines needed due to my age or health(COVID-19 vaccine)    Why is this important?    Screening tests can find diseases early when they are easier to treat.  Your doctor or nurse will talk with you about which tests are important for you.  Getting shots for common diseases like the flu and shingles will help prevent them.     Notes:  04/05/21-Patient confirms he has gotten vaccines.    .  (THN)Record weight daily (pt-stated)        Timeframe:  Long-Range Goal Priority:  High Start Date: 04/05/21                            Expected End Date:  July 2022 Follow Up Date: July 2022   -pt will monitor wgt and report any abnormal    Notes: Patient has been monitoring wgt in the home. He denies any abnormal wgt loss recently.                           Plan: RN CM discussed with patient next outreach within the month of July. Patient gave verbal consent and in agreement with RN CM follow up and timeframe. Patient aware that they may contact RN CM sooner for any issues or concerns. RN CM reviewed goals and plan of care with patient. Patient agrees to care plan and follow up. RN CM will send quarterly update to PCP.  [name], RN,BSN,CCM [place] [facility] Telephonic Care Management Coordinator Direct Phone: [phone] Toll Free: [phone] Fax: [phone]

## 2021-04-11 DIAGNOSIS — I10 Essential (primary) hypertension: Secondary | ICD-10-CM | POA: Diagnosis not present

## 2021-04-11 DIAGNOSIS — K219 Gastro-esophageal reflux disease without esophagitis: Secondary | ICD-10-CM | POA: Insufficient documentation

## 2021-04-11 DIAGNOSIS — I482 Chronic atrial fibrillation, unspecified: Secondary | ICD-10-CM | POA: Diagnosis not present

## 2021-04-11 DIAGNOSIS — D509 Iron deficiency anemia, unspecified: Secondary | ICD-10-CM | POA: Diagnosis not present

## 2021-04-11 DIAGNOSIS — R945 Abnormal results of liver function studies: Secondary | ICD-10-CM | POA: Diagnosis not present

## 2021-04-11 DIAGNOSIS — E039 Hypothyroidism, unspecified: Secondary | ICD-10-CM | POA: Diagnosis not present

## 2021-04-11 DIAGNOSIS — E538 Deficiency of other specified B group vitamins: Secondary | ICD-10-CM | POA: Diagnosis not present

## 2021-04-11 DIAGNOSIS — E782 Mixed hyperlipidemia: Secondary | ICD-10-CM | POA: Diagnosis not present

## 2021-04-11 DIAGNOSIS — R55 Syncope and collapse: Secondary | ICD-10-CM | POA: Diagnosis not present

## 2021-04-19 DIAGNOSIS — Z7901 Long term (current) use of anticoagulants: Secondary | ICD-10-CM | POA: Diagnosis not present

## 2021-04-19 DIAGNOSIS — I4891 Unspecified atrial fibrillation: Secondary | ICD-10-CM | POA: Diagnosis not present

## 2021-04-19 DIAGNOSIS — E538 Deficiency of other specified B group vitamins: Secondary | ICD-10-CM | POA: Diagnosis not present

## 2021-05-03 DIAGNOSIS — L282 Other prurigo: Secondary | ICD-10-CM | POA: Insufficient documentation

## 2021-05-08 DIAGNOSIS — I739 Peripheral vascular disease, unspecified: Secondary | ICD-10-CM | POA: Diagnosis not present

## 2021-05-08 DIAGNOSIS — L282 Other prurigo: Secondary | ICD-10-CM | POA: Diagnosis not present

## 2021-05-08 DIAGNOSIS — R600 Localized edema: Secondary | ICD-10-CM | POA: Diagnosis not present

## 2021-05-15 DIAGNOSIS — I739 Peripheral vascular disease, unspecified: Secondary | ICD-10-CM | POA: Diagnosis not present

## 2021-05-15 DIAGNOSIS — R600 Localized edema: Secondary | ICD-10-CM | POA: Diagnosis not present

## 2021-05-15 DIAGNOSIS — L853 Xerosis cutis: Secondary | ICD-10-CM | POA: Diagnosis not present

## 2021-05-17 DIAGNOSIS — D519 Vitamin B12 deficiency anemia, unspecified: Secondary | ICD-10-CM | POA: Diagnosis not present

## 2021-05-17 DIAGNOSIS — E538 Deficiency of other specified B group vitamins: Secondary | ICD-10-CM | POA: Diagnosis not present

## 2021-05-21 ENCOUNTER — Other Ambulatory Visit: Payer: Self-pay

## 2021-05-21 NOTE — Patient Outreach (Signed)
[place] [facility]) Care Management  05/21/2021  William Sandoval July 08, 1946 [id number]   Telephone Assessment    Voicemail message received from patient. Return call placed to patient. Patient states he is having a flare up of skin irritation/rash. He has PCP appt tomorrow and plans to discuss with MD. Patient inquiring if he should call dermatologist office to see about being seen there. Advised patient that was a good idea given his ongoing skin issues. He has not seen dermatologist in over a year. He will call office to see if a referral is needed. Otherwise, patient states he is doing fairly well.     Goals Addressed               This Visit's Progress     (THN)Client will verbalize understanding of treatment plan for impaired skin integrity and follow up with provider (pt-stated)        Timeframe:  Long-Range Goal Priority:  High Start Date:   05/21/2021                          Expected End Date: Sept 2022 Follow Up Date: July 2022        -pt will schedule dermatology appt          -pt will take meds as ordered    Notes: 05/21/21 -Patient reports ongoing chronic skin issues. He has PCP appt tomorrow. He will also call dermatology office to make an appt. He is applying Sarna lotion as advised with little relief.                Plan: RN CM discussed with patient next outreach within the month of July. Patient gave verbal consent and in agreement with RN CM follow up and timeframe. Patient aware that they may contact RN CM sooner for any issues or concerns. RN CM reviewed goals and plan of care with patient. Patient agrees to care plan and follow up.  [name], RN,BSN,CCM [place] [facility] Telephonic Care Management Coordinator Direct Phone: [phone] Toll Free: [phone] Fax: [phone]

## 2021-05-23 DIAGNOSIS — I739 Peripheral vascular disease, unspecified: Secondary | ICD-10-CM | POA: Diagnosis not present

## 2021-05-23 DIAGNOSIS — L282 Other prurigo: Secondary | ICD-10-CM | POA: Diagnosis not present

## 2021-05-25 DIAGNOSIS — D519 Vitamin B12 deficiency anemia, unspecified: Secondary | ICD-10-CM | POA: Diagnosis not present

## 2021-05-29 ENCOUNTER — Encounter: Payer: Self-pay | Admitting: Internal Medicine

## 2021-05-31 DIAGNOSIS — K219 Gastro-esophageal reflux disease without esophagitis: Secondary | ICD-10-CM | POA: Diagnosis not present

## 2021-05-31 DIAGNOSIS — E1165 Type 2 diabetes mellitus with hyperglycemia: Secondary | ICD-10-CM | POA: Diagnosis not present

## 2021-06-05 ENCOUNTER — Ambulatory Visit: Payer: Medicare HMO

## 2021-06-05 DIAGNOSIS — L282 Other prurigo: Secondary | ICD-10-CM | POA: Diagnosis not present

## 2021-06-12 ENCOUNTER — Other Ambulatory Visit: Payer: Self-pay

## 2021-06-12 NOTE — Patient Outreach (Signed)
[facility]) Care Management  06/12/2021  William Sandoval 1946-11-15 [id number]   Telephone Assessment   Outreach attempt to patient. Spoke with patient who reported he was currently at [facility] office and would call RN CM back at another time.       Plan: RN CM will make outreach attempt to patient within the month of Aug if no return call from patient.    [name], RN,BSN,CCM [place] Management Telephonic Care Management Coordinator Direct Phone: [phone] Toll Free: [phone] Fax: [phone]

## 2021-06-12 NOTE — Patient Outreach (Addendum)
[place] [facility]) Care Management  06/12/2021  William Sandoval 1946-01-02 [id number]   Telephone Assessment Annual Assessment  Incoming call from patient returning RN CM call. He denies any new issues at present. He has seen several of his MD's since last talking with RN CM. Patient states he is waiting on appt with dermatologist. He did see PCP for rash/skin irritation and was given a steroid injection that helped some. He denies any RN CM needs or concerns at this time.   Medications Reviewed Today     Reviewed by William Pedro, RN (Registered Nurse) on 06/12/21 at 1221  Med List Status: <None>   Medication Order Taking? Sig Documenting Provider Last Dose Status Informant  acetaminophen (TYLENOL) 650 MG CR tablet [id number] No Take 650-1,300 mg by mouth every 8 (eight) hours as needed for pain. [provider] Taking Active Self           Med Note Vinie Sill Nov 14, 2020  3:26 PM) rarely  atorvastatin (LIPITOR) 40 MG tablet [id number] No Take 40 mg by mouth daily. [provider] Taking Active Self  camphor-menthol Timoteo Ace) lotion [id number] No Apply 1 application topically as needed for itching. [provider] Taking Active Self  Cyanocobalamin (B-12 COMPLIANCE INJECTION IJ) [id number] No Inject as directed. [provider] Taking Active Self  levothyroxine (SYNTHROID) 112 MCG tablet [id number] No Take 112 mcg by mouth daily before breakfast. [provider] Taking Active Self  loratadine (CLARITIN) 10 MG tablet [id number] No Take 20 mg by mouth daily. [provider] Taking Active Self  metoprolol succinate (TOPROL-XL) 50 MG 24 hr tablet [id number] No Take 50 mg by mouth daily. [provider] Taking Active Self  Multiple Vitamin (MULTIVITAMIN WITH MINERALS) TABS tablet [id number] No Take 1 tablet by mouth daily. VitaFusion Men's Multivitamin [provider] Taking Active Self   mupirocin cream (BACTROBAN) 2 % [id number] No Apply 1 application topically 2 (two) times daily as needed (wound care). [provider] Taking Active Self  omeprazole (PRILOSEC) 20 MG capsule [id number]  Take 1 capsule (20 mg total) by mouth daily. Mahala Menghini, PA-C  Active   triamcinolone (KENALOG) 0.1 % [id number] No Apply 1 application topically 2 (two) times daily as needed (rash). [provider] Taking Active Self  warfarin (COUMADIN) 5 MG tablet [id number] No Take 5-7.5 mg by mouth See admin instructions. Take 1.5 tablets (7.5 mg) on Mondays, Wednesdays, Fridays, & Saturdays then take 1 tablet (5 mg) by mouth on all other days. [provider] Taking Active Self             Fall Risk  06/12/2021 04/05/2021 06/07/2020 04/14/2020 02/02/2020  Falls in the past year? 0 0 1 0 0  Number falls in past yr: 0 0 0 0 0  Injury with Fall? 0 0 0 0 0  Risk for fall due to : Medication side effect Medication side effect Medication side effect;History of fall(s) Medication side effect Medication side effect;Orthopedic patient  Follow up Falls evaluation completed Education provided;Falls evaluation completed Falls evaluation completed;Education provided Falls evaluation completed;Education provided Falls evaluation completed;Education provided    Depression screen [facility] 2/9 06/12/2021 06/07/2020 09/20/2019  Decreased Interest 0 0 0  Down, Depressed, Hopeless 0 0 0  PHQ - 2 Score 0 0 0    SDOH Screenings   Alcohol Screen: Not on file  Depression (PHQ2-9): Low Risk    PHQ-2 Score: 0  Financial Resource Strain: Not  on file  Food Insecurity: No Food Insecurity   Worried About Charity fundraiser in the Last Year: Never true   Ran Out of Food in the Last Year: Never true  Housing: Not on file  Physical Activity: Not on file  Social Connections: Not on file  Stress: Not on file  Tobacco Use: Low Risk    Smoking Tobacco Use: Never   Smokeless Tobacco Use: Never  Transportation  Needs: No Transportation Needs   Lack of Transportation (Medical): No   Lack of Transportation (Non-Medical): No     Goals Addressed               This Visit's Progress     (THN)Client will verbalize understanding of treatment plan for impaired skin integrity and follow up with provider (pt-stated)        Timeframe:  Long-Range Goal Priority:  High Start Date:   05/21/2021                          Expected End Date: Sept 2022 Follow Up Date: Sept 2022        Barriers: Health Behaviors Knowledge  -pt will schedule dermatology appt          -pt will take meds as ordered    Notes: 05/21/21 -Patient reports ongoing chronic skin issues. He has PCP appt tomorrow. He will also call dermatology office to make an appt. He is applying Sarna lotion as advised with little relief.         06/12/21-Patient awaiting dermatology appt.he saw PCP and was given steroid injection to help with skin issue which helped some.        [facility] and Keep All Appointments (pt-stated)        Timeframe:  Long-Range Goal Priority:  High Start Date:   10/02/2020                          Expected End Date:   Sept 2022                   Follow Up Date Sept 2022   - keep a calendar with prescription refill dates - keep a calendar with appointment dates  Patient will complete GI MD appt.   Why is this important?   Part of staying healthy is seeing the doctor for follow-up care.  If you forget your appointments, there are some things you can do to stay on track.    Notes:  12/18/20-Patient complete colonoscopy and goes for GI f/u in March.  04/05/21-Patient reports that he continues to have ongoing GI work up and appts to determine wgt loss and anemia issues. He states that he feels fine. Appetite remains good. Wgt stable at 205lbs.  06/12/21-Patient having some skin issues-waiting appt with dermatologist for further work up.       COMPLETED: (THN)Record weight daily (pt-stated)        Timeframe:   Long-Range Goal Priority:  High Start Date: 04/05/21                            Expected End Date:  July 2022 Follow Up Date: July 2022  Barriers: Knowledge  -pt will monitor wgt and report any abnormal    Notes: Patient has been monitoring wgt in the home. He denies any abnormal wgt loss recently.  06/12/21-Patient reports wgt is stable-205lbs Appetite WNL. GI sxs have improved.          Plan: RN CM discussed with patient next outreach within the month of Sept. Patient gave verbal consent and in agreement with RN CM follow up and timeframe. Patient aware that they may contact RN CM sooner for any issues or concerns. RN CM reviewed goals and plan of care with patient. Patient agrees to care plan and follow up.  [name], RN,BSN,CCM [place] Management Telephonic Care Management Coordinator Direct Phone: [phone] Toll Free: [phone] Fax: [phone]

## 2021-06-13 ENCOUNTER — Telehealth: Payer: Self-pay | Admitting: Dermatology

## 2021-06-13 NOTE — Telephone Encounter (Signed)
Patient is calling for a referral appointment from Allyn Kenner, MD.  Patient is scheduled for 08/22/2021 at 9:15 with Lavonna Monarch, MD.

## 2021-06-14 NOTE — Telephone Encounter (Signed)
Referral attached to appointment

## 2021-06-18 ENCOUNTER — Telehealth: Payer: Self-pay | Admitting: Gastroenterology

## 2021-06-18 NOTE — Telephone Encounter (Signed)
Can we please request copy of labs I requested since last ov 01/2021. He likely took to Dr. Juel Burrow to have done.   I ordered Celiac disease HLA testing (HLA DQ2/DQ8), iron/tibc/ferritin, h.pylori serologies.

## 2021-06-20 DIAGNOSIS — D519 Vitamin B12 deficiency anemia, unspecified: Secondary | ICD-10-CM | POA: Diagnosis not present

## 2021-06-20 DIAGNOSIS — E538 Deficiency of other specified B group vitamins: Secondary | ICD-10-CM | POA: Diagnosis not present

## 2021-06-21 NOTE — Telephone Encounter (Signed)
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  Patient has history of EGD with normal appearing small bowel mucosa but on biopsy there were intraepithelial lymphocytes which can be seen with celiac/peptic/H.pylori/NSAID related. Previous celiac serologies negative.     Please let pt know that we received copy of his 04/2021 labs we ordered that he had done at Dr. Juel Burrow office. They were never sent to [place].   H.pylori IgG, iron/tibc/ferritin may not have been done as we did not get a copy.  We will get with next labs. B12, CBC normal.  His alkaline phosphatase is up more (can be up in liver, bone, intestinal disease). Will address at upcoming ov.  Genetic testing for celiac disease showed that he has 1 in 7 chance of getting celiac disease. His small bx from egd last December showed some changes concerning for gluten sensitivity. We will address at upcoming ov. If he notes any abdominal pain, diarrhea, weight loss, bleeding he should let me know.  If we can move his appt up (currently in 09/2021) that would be helpful.

## 2021-06-22 ENCOUNTER — Telehealth: Payer: Self-pay | Admitting: Internal Medicine

## 2021-06-22 NOTE — Telephone Encounter (Signed)
Phoned and LMOVM to return call

## 2021-06-22 NOTE — Telephone Encounter (Signed)
Pt returning call. [phone]

## 2021-06-22 NOTE — Telephone Encounter (Signed)
See other phone note

## 2021-06-25 NOTE — Telephone Encounter (Signed)
Phoned and LMOVM for the pt to return call 

## 2021-06-26 NOTE — Telephone Encounter (Signed)
Letter mailed to the pt. 

## 2021-07-20 NOTE — Telephone Encounter (Signed)
William Sandoval is patient aware of results and recommendations? See other phone note from 06/22/21.   Also, looks like has not been forwarded to [facility] for #5.   Forwarded to [place].

## 2021-07-23 NOTE — Telephone Encounter (Signed)
This pt has not returned my calls or responded to my letter for him to contact us. This pt has always been difficult to make contact with. I didn't send it to [facility] because a lot of Dr. Reola Mosher pts want a particular time frame and I be advised they get a letter with appt already scheduled. So I find it easier for them to give me a time frame so I can let her know. I will reach out to the pt again today to advise of results.

## 2021-07-23 NOTE — Telephone Encounter (Signed)
Lmom for pt to call back. 

## 2021-07-23 NOTE — Telephone Encounter (Signed)
If a patient cannot be reached, then it's best to let [facility] schedule and send letter, that way we can be sure we have provided them with an appt. Otherwise, it's at risk of being lost to follow up.

## 2021-07-24 NOTE — Telephone Encounter (Signed)
William Sandoval I advised the pt of you receiving his labs from May and that the H pylori and iron panel we didn't get. Advised of all the other other labs and that his alkaline phosphatase and bx from edg last December will be addressed at your next visit. Pt isn't experiencing any symptoms at all. In fact he states he feels great. Advised his appt currently for 09/2021 will be moved up. He states it is fine please let him know.    Erline Levine, please move the pt up before October to see William Sandoval

## 2021-07-24 NOTE — Telephone Encounter (Signed)
noted 

## 2021-08-01 ENCOUNTER — Other Ambulatory Visit: Payer: Self-pay

## 2021-08-01 ENCOUNTER — Encounter: Payer: Self-pay | Admitting: Cardiovascular Disease

## 2021-08-01 ENCOUNTER — Ambulatory Visit: Payer: Medicare HMO | Admitting: Cardiovascular Disease

## 2021-08-01 VITALS — BP 150/81 | HR 60 | Ht 76.0 in | Wt 207.2 lb

## 2021-08-01 DIAGNOSIS — K219 Gastro-esophageal reflux disease without esophagitis: Secondary | ICD-10-CM | POA: Diagnosis not present

## 2021-08-01 DIAGNOSIS — I4891 Unspecified atrial fibrillation: Secondary | ICD-10-CM | POA: Diagnosis not present

## 2021-08-01 DIAGNOSIS — I4811 Longstanding persistent atrial fibrillation: Secondary | ICD-10-CM | POA: Diagnosis not present

## 2021-08-01 DIAGNOSIS — I872 Venous insufficiency (chronic) (peripheral): Secondary | ICD-10-CM

## 2021-08-01 DIAGNOSIS — E1165 Type 2 diabetes mellitus with hyperglycemia: Secondary | ICD-10-CM | POA: Diagnosis not present

## 2021-08-01 NOTE — Progress Notes (Signed)
08/01/2021 VANDERBILT PORTEE   [birth year]  [id number]  Primary Physician William Squibb, MD Primary Cardiologist: William Harp MD William Sandoval, [place]  HPI:  William Sandoval is a 75 y.o. male thin appearing single Caucasian male with no children who retired 5 years ago from working a [place] for 46 years.  He is previously seen William Sandoval back in 2011.  He was referred by William Sandoval for evaluation of venous stasis.  He has a history of hypertension and hyperlipidemia as well as chronic A. fib rate controlled on Coumadin anticoagulation.  He is never had a heart attack or stroke.  He does not smoke.  He denies chest pain or shortness of breath.  Does have some discoloration on his pretibial area in his lower extremities consistent with venous stasis.  He denies claudication.   Current Meds  Medication Sig   acetaminophen (TYLENOL) 650 MG CR tablet Take 650-1,300 mg by mouth every 8 (eight) hours as needed for pain.   atorvastatin (LIPITOR) 40 MG tablet Take 40 mg by mouth daily.   camphor-menthol (SARNA) lotion Apply 1 application topically as needed for itching.   Cyanocobalamin (B-12 COMPLIANCE INJECTION IJ) Inject as directed.   levothyroxine (SYNTHROID) 112 MCG tablet Take 112 mcg by mouth daily before breakfast.   loratadine (CLARITIN) 10 MG tablet Take 20 mg by mouth daily.   metoprolol succinate (TOPROL-XL) 50 MG 24 hr tablet Take 50 mg by mouth daily.   Multiple Vitamin (MULTIVITAMIN WITH MINERALS) TABS tablet Take 1 tablet by mouth daily. VitaFusion Men's Multivitamin   mupirocin cream (BACTROBAN) 2 % Apply 1 application topically 2 (two) times daily as needed (wound care).   omeprazole (PRILOSEC) 20 MG capsule Take 1 capsule (20 mg total) by mouth daily.   triamcinolone (KENALOG) 0.1 % Apply 1 application topically 2 (two) times daily as needed (rash).   warfarin (COUMADIN) 5 MG tablet Take 5-7.5 mg by mouth See admin instructions. Take 1.5 tablets (7.5 mg) on Mondays,  Wednesdays, Fridays, & Saturdays then take 1 tablet (5 mg) by mouth on all other days.     Allergies  Allergen Reactions   Gluten Meal Other (See Comments)    Unknown    Shrimp [Shellfish Allergy] Other (See Comments)    Unknown    Sertraline Rash    Social History   Socioeconomic History   Marital status: Single    Spouse name: Not on file   Number of children: Not on file   Years of education: Not on file   Highest education level: Not on file  Occupational History   Occupation: Loading dock, factory work    Fish farm manager: UNEMPLOYED  Tobacco Use   Smoking status: Never   Smokeless tobacco: Never  Vaping Use   Vaping Use: Never used  Substance and Sexual Activity   Alcohol use: No   Drug use: No   Sexual activity: Not on file  Other Topics Concern   Not on file  Social History Narrative   Not on file   Social Determinants of Health   Financial Resource Strain: Not on file  Food Insecurity: No Food Insecurity   Worried About Charity fundraiser in the Last Year: Never true   [place] in the Last Year: Never true  Transportation Needs: No Transportation Needs   Lack of Transportation (Medical): No   Lack of Transportation (Non-Medical): No  Physical Activity: Not on file  Stress: Not  on file  Social Connections: Not on file  Intimate Partner Violence: Not on file     Review of Systems: General: negative for chills, fever, night sweats or weight changes.  Cardiovascular: negative for chest pain, dyspnea on exertion, edema, orthopnea, palpitations, paroxysmal nocturnal dyspnea or shortness of breath Dermatological: negative for rash Respiratory: negative for cough or wheezing Urologic: negative for hematuria Abdominal: negative for nausea, vomiting, diarrhea, bright red blood per rectum, melena, or hematemesis Neurologic: negative for visual changes, syncope, or dizziness All other systems reviewed and are otherwise negative except as noted  above.    Blood pressure (!) 150/81, pulse 60, height '6\' 4"'$  (1.93 m), weight 207 lb 3.2 oz (94 kg), SpO2 98 %.  General appearance: alert and no distress Neck: no adenopathy, no carotid bruit, no JVD, supple, symmetrical, trachea midline, and thyroid not enlarged, symmetric, no tenderness/mass/nodules Lungs: clear to auscultation bilaterally Heart: irregularly irregular rhythm Extremities: extremities normal, atraumatic, no cyanosis or edema Pulses: Pedal pulses Skin: Venous stasis changes Neurologic: Grossly normal  EKG atrial fibrillation with a ventricular response of 49 and incomplete right bundle branch block.  I personally reviewed this EKG.  ASSESSMENT AND PLAN:   HYPERLIPIDEMIA History of hyperlipidemia on statin therapy with lipid profile performed 04/04/2021 revealing total cholesterol 147, LDL of 83 and HDL 40.  ATRIAL FIBRILLATION History of chronic atrial fibrillation, rate controlled on Coumadin anticoagulation.  Venous stasis dermatitis William Sandoval was referred to me by William Sandoval for evaluation of discoloration of his lower extremities.  He really denies claudication.  There is no edema.  He has what appears to be venous stasis changes.  He is asymptomatic from this.  No further evaluation is warranted at this time.     William Harp MD FACP,FACC,FAHA, [facility] 08/01/2021 11:35 AM

## 2021-08-01 NOTE — Assessment & Plan Note (Signed)
History of hyperlipidemia on statin therapy with lipid profile performed 04/04/2021 revealing total cholesterol 147, LDL of 83 and HDL 40.

## 2021-08-01 NOTE — Assessment & Plan Note (Signed)
History of chronic atrial fibrillation, rate controlled on Coumadin anticoagulation.

## 2021-08-01 NOTE — Patient Instructions (Signed)
Medication Instructions:  The current medical regimen is effective;  continue present plan and medications.  *If you need a refill on your cardiac medications before your next appointment, please call your pharmacy*    Follow-Up: At [facility], you and your health needs are our priority.  As part of our continuing mission to provide you with exceptional heart care, we have created designated Provider Care Teams.  These Care Teams include your primary Cardiologist (physician) and Advanced Practice Providers (APPs -  Physician Assistants and Nurse Practitioners) who all work together to provide you with the care you need, when you need it.  We recommend signing up for the patient portal called "MyChart".  Sign up information is provided on this After Visit Summary.  MyChart is used to connect with patients for Virtual Visits (Telemedicine).  Patients are able to view lab/test results, encounter notes, upcoming appointments, etc.  Non-urgent messages can be sent to your provider as well.   To learn more about what you can do with MyChart, go to [email or web address].    Your next appointment:   As needed  The format for your next appointment:   In Person  Provider:   Jonathan Berry, MD     

## 2021-08-01 NOTE — Assessment & Plan Note (Signed)
[name] was referred to me by Dr. Nevada Crane for evaluation of discoloration of his lower extremities.  He really denies claudication.  There is no edema.  He has what appears to be venous stasis changes.  He is asymptomatic from this.  No further evaluation is warranted at this time.

## 2021-08-13 DIAGNOSIS — D519 Vitamin B12 deficiency anemia, unspecified: Secondary | ICD-10-CM | POA: Diagnosis not present

## 2021-08-17 ENCOUNTER — Other Ambulatory Visit: Payer: Self-pay

## 2021-08-17 NOTE — Patient Outreach (Signed)
[place] [facility]) Care Management  08/17/2021  William Sandoval Sep 04, 1946 [id number]   Telephone Assessment    Unsuccessful outreach attempt to patient.       Plan: RN CM will make outreach attempt within the month of Nov if no return call from patient.  William Montgomery, RN,BSN,CCM [place] [facility] Telephonic Care Management Coordinator Direct Phone: [phone] Toll Free: [phone] Fax: [phone]

## 2021-08-20 ENCOUNTER — Ambulatory Visit: Payer: Self-pay

## 2021-08-22 ENCOUNTER — Other Ambulatory Visit: Payer: Self-pay

## 2021-08-22 ENCOUNTER — Encounter: Payer: Self-pay | Admitting: Dermatology

## 2021-08-22 ENCOUNTER — Ambulatory Visit: Payer: Medicare HMO | Admitting: Dermatology

## 2021-08-22 DIAGNOSIS — D485 Neoplasm of uncertain behavior of skin: Secondary | ICD-10-CM | POA: Diagnosis not present

## 2021-08-22 DIAGNOSIS — L309 Dermatitis, unspecified: Secondary | ICD-10-CM

## 2021-08-22 DIAGNOSIS — L308 Other specified dermatitis: Secondary | ICD-10-CM

## 2021-08-22 DIAGNOSIS — L2084 Intrinsic (allergic) eczema: Secondary | ICD-10-CM

## 2021-08-22 MED ORDER — CLOBETASOL PROP EMOLLIENT BASE 0.05 % EX CREA: 1.0000 "application " | TOPICAL_CREAM | Freq: Two times a day (BID) | CUTANEOUS | 3 refills | Status: DC

## 2021-08-22 NOTE — Patient Instructions (Addendum)
Pramoxine anti itch ingredient.  Can get at any store.  Can get generic Claritin 10 mg x 2 per day.   Biopsy, Surgery (Curettage) & Surgery (Excision) Aftercare Instructions  1. Okay to remove bandage in 24 hours  2. Wash area with soap and water  3. Apply Vaseline to area twice daily until healed (Not Neosporin)  4. Okay to cover with a Band-Aid to decrease the chance of infection or prevent irritation from clothing; also it's okay to uncover lesion at home.  5. Suture instructions: return to our office in 7-10 or 10-14 days for a nurse visit for suture removal. Variable healing with sutures, if pain or itching occurs call our office. It's okay to shower or bathe 24 hours after sutures are given.  6. The following risks may occur after a biopsy, curettage or excision: bleeding, scarring, discoloration, recurrence, infection (redness, yellow drainage, pain or swelling).  7. For questions, concerns and results call our office at [place] before 4pm & Friday before 3pm. Biopsy results will be available in 1 week.

## 2021-08-27 ENCOUNTER — Telehealth: Payer: Self-pay

## 2021-08-27 NOTE — Telephone Encounter (Signed)
-----   Message from Lavonna Monarch, MD sent at 08/24/2021  6:59 AM EDT ----- Please inform patient that his biopsies do support a diagnosis of eczema (also called atopic dermatitis).  If we are not able to control this with surface medications, we will try to help him get the Dupixent medication at an affordable price.he also should continue to take generic Claritin (loratadine) (b.I.d. 10mg  morning and late afternoon) every day.

## 2021-08-27 NOTE — Telephone Encounter (Signed)
Phone call to patient with his pathology results. Voicemail left for patient to give the office a call back.  ?

## 2021-08-27 NOTE — Telephone Encounter (Signed)
Phone call from patient returning our call for his pathology results. Patient aware of results and Dr. Onalee Hua recommendations.  Patient states that he's having improvement.

## 2021-08-31 DIAGNOSIS — E1165 Type 2 diabetes mellitus with hyperglycemia: Secondary | ICD-10-CM | POA: Diagnosis not present

## 2021-08-31 DIAGNOSIS — K219 Gastro-esophageal reflux disease without esophagitis: Secondary | ICD-10-CM | POA: Diagnosis not present

## 2021-09-04 ENCOUNTER — Other Ambulatory Visit: Payer: Self-pay

## 2021-09-04 NOTE — Patient Outreach (Signed)
[place] [facility]) Care Management  09/04/2021  OSMAR HOWTON May 19, 1946 [id number]   Telephone Assessment     Voicemail message received from patient. Return call placed to patient. No answer at present.      Plan: RN CM will make outreach to patient within the month of Nov if no return call from patient.   [name], RN,BSN,CCM [place] Management Telephonic Care Management Coordinator Direct Phone: [phone] Toll Free: [phone] Fax: [phone]

## 2021-09-04 NOTE — Patient Outreach (Addendum)
[facility]) Care Management  09/04/2021  JERIMIE MANCUSO 08-Feb-1946 [id number]   Telephone Assessment   Incoming call from patient. He reports that things have been going fairly well. Patient continues to reside in his home alone-has  as sister that lives nearby. He remains independent with ADLs/IADLs. No recent falls. Appetite fair and WNL. Wgt stable. He denies any RN CM needs or concerns at this time.   Medications Reviewed Today     Reviewed by [name], RN (Registered Nurse) on 09/04/21 at 1118  Med List Status: <None>   Medication Order Taking? Sig Documenting Provider Last Dose Status Informant  acetaminophen (TYLENOL) 650 MG CR tablet [id number] No Take 650-1,300 mg by mouth every 8 (eight) hours as needed for pain. [provider] Taking Active Self           Med Note Vinie Sill Nov 14, 2020  3:26 PM) rarely  atorvastatin (LIPITOR) 40 MG tablet [id number] No Take 40 mg by mouth daily. [provider] Taking Active Self  camphor-menthol Timoteo Ace) lotion [id number] No Apply 1 application topically as needed for itching. [provider] Taking Active Self  Clobetasol Prop Emollient Base (CLOBETASOL PROPIONATE E) 0.05 % emollient cream [id number]  Apply 1 application topically 2 (two) times daily. Lavonna Monarch, MD  Active   Cyanocobalamin (B-12 COMPLIANCE INJECTION IJ) [id number] No Inject as directed. [provider] Taking Active Self  [place] 300 MG/2ML SOPN [id number] No  [provider] Taking Active   furosemide (LASIX) 40 MG tablet [id number] No  [provider] Taking Active   hydrOXYzine (ATARAX/VISTARIL) 25 MG tablet [id number] No  [provider] Taking Active   levothyroxine (SYNTHROID) 112 MCG tablet [id number] No Take 112 mcg by mouth daily before breakfast. [provider] Taking Active Self  loratadine (CLARITIN) 10 MG tablet [id number] No Take 20 mg by  mouth daily. [provider] Taking Active Self  metoprolol succinate (TOPROL-XL) 50 MG 24 hr tablet [id number] No Take 50 mg by mouth daily. [provider] Taking Active Self  Multiple Vitamin (MULTIVITAMIN WITH MINERALS) TABS tablet [id number] No Take 1 tablet by mouth daily. VitaFusion Men's Multivitamin [provider] Taking Active Self  mupirocin cream (BACTROBAN) 2 % [id number] No Apply 1 application topically 2 (two) times daily as needed (wound care). [provider] Taking Active Self  mupirocin ointment (BACTROBAN) 2 % [id number] No  [provider] Taking Active   omeprazole (PRILOSEC) 20 MG capsule [id number] No Take 1 capsule (20 mg total) by mouth daily. Mahala Menghini, PA-C Taking Active   Potassium Chloride ER 20 MEQ TBCR [id number] No  [provider] Taking Active   triamcinolone (KENALOG) 0.1 % [id number] No Apply 1 application topically 2 (two) times daily as needed (rash). [provider] Taking Active Self  triamcinolone cream (KENALOG) 0.5 % [id number] No  [provider] Taking Active   warfarin (COUMADIN) 5 MG tablet [id number] No Take 5-7.5 mg by mouth See admin instructions. Take 1.5 tablets (7.5 mg) on Mondays, Wednesdays, Fridays, & Saturdays then take 1 tablet (5 mg) by mouth on all other days. [provider] Taking Active Self              Goals Addressed               This Visit's Progress     (THN)Client will verbalize understanding of treatment plan for impaired skin integrity and follow up with provider (  pt-stated)        Timeframe:  Long-Range Goal Priority:  High Start Date:   05/21/2021                          Expected End Date: Jan 2022 Follow Up Date: Dec 2022        Barriers: Health Behaviors Knowledge  -pt will schedule dermatology appt          -pt will take meds as ordered    Notes: 05/21/21 -Patient reports ongoing chronic skin issues. He has PCP appt  tomorrow. He will also call dermatology office to make an appt. He is applying Sarna lotion as advised with little relief.         06/12/21-Patient awaiting dermatology appt.he saw PCP and was given steroid injection to help with skin issue which helped some.   09/04/21-Patient went to see dermatologist in [place]. He had skin biopsy which confirmed he has dermatitis(eczema). He was started on some new ointment cream.Patient reports that he saw improvement in sxs within 48-72hrs of using med. He goes back for follow up within a month.       COMPLETED: (THN)Make and Keep All Appointments (pt-stated)        Timeframe:  Long-Range Goal Priority:  High Start Date:   10/02/2020                          Expected End Date:   Sept 2022                   Follow Up Date Sept 2022   - keep a calendar with prescription refill dates - keep a calendar with appointment dates  Patient will complete GI MD appt.   Why is this important?   Part of staying healthy is seeing the doctor for follow-up care.  If you forget your appointments, there are some things you can do to stay on track.    Notes:  12/18/20-Patient complete colonoscopy and goes for GI f/u in March.  04/05/21-Patient reports that he continues to have ongoing GI work up and appts to determine wgt loss and anemia issues. He states that he feels fine. Appetite remains good. Wgt stable at 205lbs.  06/12/21-Patient having some skin issues-waiting appt with dermatologist for further work up.  09/04/21-Patient completed GI appt. He reports GI sxs have improved and he feels like he is almost at baseline. He saw dermatologist recently and was started on new med.         Plan:  RN CM discussed with patient next outreach within the month of Dec. Patient agrees to care plan and follow up. Patient gave verbal consent and in agreement with RN CM follow up and timeframe. Patient aware that they may contact RN CM sooner for any issues or concerns. RN CM  reviewed goals and plan of care with patient. RN CM will send quarterly update to PCP. RN CM mailed patient letter with RN CM/THN contact info per pt request as he misplaced previous one.   [name], RN,BSN,CCM [place] [facility] Telephonic Care Management Coordinator Direct Phone: [phone] Toll Free: [phone] Fax: [phone]

## 2021-09-06 NOTE — Progress Notes (Signed)
   Follow-Up Visit   Subjective  William Sandoval is a 75 y.o. male who presents for the following: Rash (Patient has rash on his back that itches. X 9-10 months. Patient did see PCP and got prednisone pills but rash came back. Tried triamcinolone cream. Patients PCP has tried him on Dupixent but patient has put a hold on it until he met with [place]. Only had 2 dupixent shots. ).  Extensive rash, did receive 2 Dupixent shots through his primary care physician. Location:  Duration:  Quality:  Associated Signs/Symptoms: Modifying Factors:  Severity:  Timing: Context:   Objective  Well appearing patient in no apparent distress; mood and affect are within normal limits. Right Lower Back See photos, extensive dermatitis, some areas more urticarial.  Biopsy indicated to rule out nonvesicular pemphigoid.  We will divide 2 biopsies for routine histology and immunofluorescence.     Right Upper Back     Left Lower Back, Left Upper Back, Mid Back, Right Lower Back, Right Upper Back Patient has fairly extensive albeit subtle eczematous dermatitis covering most of his back and scattered on anterior torso and extremities.    All skin waist up examined.   Assessment & Plan    Neoplasm of uncertain behavior of skin (2) Right Lower Back  Skin / nail biopsy Type of biopsy: tangential   Informed consent: discussed and consent obtained   Timeout: patient name, date of birth, surgical site, and procedure verified   Anesthesia: the lesion was anesthetized in a standard fashion   Anesthetic:  1% lidocaine w/ epinephrine 1-100,000 local infiltration Instrument used: flexible razor blade   Hemostasis achieved with: ferric subsulfate   Outcome: patient tolerated procedure well   Post-procedure details: wound care instructions given    Specimen 1 - Surgical pathology Differential Diagnosis: generalized pruritis with urticarial dermatitis; adult eczema, rule out dermatitis  herpetiformis. Michelles soulution   Check Margins: No  Right Upper Back  Skin / nail biopsy Type of biopsy: tangential   Informed consent: discussed and consent obtained   Timeout: patient name, date of birth, surgical site, and procedure verified   Anesthesia: the lesion was anesthetized in a standard fashion   Anesthetic:  1% lidocaine w/ epinephrine 1-100,000 local infiltration Instrument used: flexible razor blade   Hemostasis achieved with: ferric subsulfate   Outcome: patient tolerated procedure well   Post-procedure details: wound care instructions given    Specimen 2 - Surgical pathology Differential Diagnosis: generalized pruritis with urticarial dermatitis; adult eczema, rule out dermatitis herpetiformis.  Check Margins: No  Intrinsic (allergic) eczema Left Upper Back; Right Upper Back; Left Lower Back; Right Lower Back; Mid Back  I do feel that Dupixent is a reasonable treatment to try and we will proceed to try and help him obtain this at an affordable out-of-pocket price.  He may also increase the potency of his topical therapy from triamcinolone to clobetasol, to be applied daily after bathing.  Follow-up 4 weeks.  Clobetasol Prop Emollient Base (CLOBETASOL PROPIONATE E) 0.05 % emollient cream - Left Lower Back, Left Upper Back, Mid Back, Right Lower Back, Right Upper Back Apply 1 application topically 2 (two) times daily.      I, Lavonna Monarch, MD, have reviewed all documentation for this visit.  The documentation on 09/06/21 for the exam, diagnosis, procedures, and orders are all accurate and complete.

## 2021-09-11 DIAGNOSIS — E538 Deficiency of other specified B group vitamins: Secondary | ICD-10-CM | POA: Diagnosis not present

## 2021-09-25 ENCOUNTER — Telehealth: Payer: Self-pay

## 2021-09-25 DIAGNOSIS — L2084 Intrinsic (allergic) eczema: Secondary | ICD-10-CM

## 2021-09-25 NOTE — Telephone Encounter (Signed)
[employer] faxed over Drug change for clobetasol. I left a message for patient do wee need to change pharmacy or drug 20$ at [facility] for 60g tube

## 2021-09-26 ENCOUNTER — Other Ambulatory Visit: Payer: Self-pay

## 2021-09-26 ENCOUNTER — Encounter: Payer: Self-pay | Admitting: Gastroenterology

## 2021-09-26 ENCOUNTER — Ambulatory Visit: Payer: Medicare HMO | Admitting: Gastroenterology

## 2021-09-26 VITALS — BP 141/78 | HR 69 | Temp 97.1°F | Ht 76.0 in | Wt 209.8 lb

## 2021-09-26 DIAGNOSIS — L299 Pruritus, unspecified: Secondary | ICD-10-CM | POA: Diagnosis not present

## 2021-09-26 DIAGNOSIS — R7989 Other specified abnormal findings of blood chemistry: Secondary | ICD-10-CM

## 2021-09-26 DIAGNOSIS — D509 Iron deficiency anemia, unspecified: Secondary | ICD-10-CM | POA: Diagnosis not present

## 2021-09-26 DIAGNOSIS — D649 Anemia, unspecified: Secondary | ICD-10-CM

## 2021-09-26 DIAGNOSIS — R634 Abnormal weight loss: Secondary | ICD-10-CM | POA: Diagnosis not present

## 2021-09-26 NOTE — Patient Instructions (Signed)
Please complete labs and H.pylori stool test. For the stool test you will need to stay off antacids and omeprazole until after you complete test.

## 2021-09-26 NOTE — Telephone Encounter (Signed)
Patient left message on office voice mail that he was returning call.

## 2021-09-26 NOTE — Progress Notes (Signed)
Primary Care Physician: Celene Squibb, MD  Primary Gastroenterologist:  Elon Alas. Abbey Chatters, DO   Chief Complaint  Patient presents with   Anemia    No bleeding    HPI: William Sandoval is a 75 y.o. male here for follow-up.  Last seen in March 2022.  History of A. fib on Coumadin, anemia, PCP with concern for possible GI bleeding (no overt gi bleeding and heme negative), loose stools and weight loss.    Received iron infusions in October 2021 (hemoglobin 10.4, MCV 117 at that time).  He contributed his weight loss to the death of his mother 02-03-2015), patient living alone and does not like to cook.  He initially lost 20 pounds and continues to have had gradual weight loss of total of 35 pounds at time of last office visit. Labs in December 2021 with negative celiac serologies, B12 low at 73, iron 91, ferritin 685, folate 19.3, hemoglobin 12.6.  Repeat ferritin in December 2021 was still elevated at 606.  02/03/21 his hemoglobin was normal at 14.2, iron 63, iron saturation is 34%, TIBC 188, ferritin remained elevated at 546.  HFE genetics were negative.  He completed EGD and colonoscopy as outlined below. B12 monthly injections.    Received copy of labs dated 04/20/2021:  Vitamin B12 460, TSH 3.360, A1c 5.6, glucose 95, creatinine 0.8, albumin 3.7, total bilirubin 0.5, alkaline phosphatase 216, AST 27, ALT 25, white blood cell count 9300, hemoglobin 15.3, platelets 256,000.  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   Patient has history of EGD with normal appearing small bowel mucosa but on biopsy there were intraepithelial lymphocytes which can be seen with celiac/peptic/H.pylori/NSAID related. Previous celiac serologies negative.   Today: No heartburn. If gets a little bit of heartburn takes over the counter antacid. Not taking omeprazole. BM regular. No melena, brbpr. No vomiting. No dysphagia. No  much of a cook. Says he would eat out every meal if he had the money. 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 Weight up two pounds in the past two months, seems to have stabilized.   His biggest complaint is persisting intractable itching for which he is seeing dermatology. States he has had biopsies. Expensive medication pending if can get approved. Over the years, steroids always helps with the itching but symptoms always return. He has had symptoms off/on for over five years. He is not avoiding gluten even though he reports allergy testing was positive. He says he does not have the finances to eat special food and he consumes a lot of sandwiches because they are cheap.  Colonoscopy November 20, 2020: -Non-bleeding internal hemorrhoids. - Diverticulosis in the sigmoid colon and in the descending colon. - There was significant looping of the colon. - The examination was otherwise normal. - No specimens collected.   EGD November 20, 2020: - Z-line irregular, 40 cm from the incisors. Biopsied (reflux changes). - Normal stomach. - Normal duodenal bulb, first portion of the duodenum and second portion of the duodenum. Biopsied (increased intraepithelial lymphocytes but no significant villous blunting, possible gluten sensitivity versus peptic injury versus NSAID injury versus H. Pylori).      Current Outpatient Medications  Medication Sig Dispense Refill   acetaminophen (TYLENOL) 650 MG CR tablet Take 650-1,300 mg by mouth every 8 (  eight) hours as needed for pain.     atorvastatin (LIPITOR) 40 MG tablet Take 40 mg by mouth daily.     camphor-menthol (SARNA) lotion Apply 1 application topically as needed for itching.     Clobetasol Prop Emollient Base (CLOBETASOL PROPIONATE E) 0.05 % emollient cream Apply 1 application topically 2 (two) times daily. 60 g 3   Cyanocobalamin (B-12 COMPLIANCE INJECTION IJ) Inject as  directed.     levothyroxine (SYNTHROID) 112 MCG tablet Take 112 mcg by mouth daily before breakfast.     loratadine (CLARITIN) 10 MG tablet Take 20 mg by mouth daily.     metoprolol succinate (TOPROL-XL) 50 MG 24 hr tablet Take 50 mg by mouth daily.     Multiple Vitamin (MULTIVITAMIN WITH MINERALS) TABS tablet Take 1 tablet by mouth daily. VitaFusion Men's Multivitamin     omeprazole (PRILOSEC) 20 MG capsule Take 1 capsule (20 mg total) by mouth daily. 30 capsule 5   triamcinolone (KENALOG) 0.1 % Apply 1 application topically 2 (two) times daily as needed (rash).     triamcinolone cream (KENALOG) 0.5 %      warfarin (COUMADIN) 5 MG tablet Take 5-7.5 mg by mouth See admin instructions. Take 1.5 tablets (7.5 mg) on Mondays, Wednesdays, Fridays, & Saturdays then take 1 tablet (5 mg) by mouth on all other days.     No current facility-administered medications for this visit.    Allergies as of 09/26/2021 - Review Complete 09/26/2021  Allergen Reaction Noted   Gluten meal Other (See Comments) 08/26/2015   Shrimp [shellfish allergy] Other (See Comments) 08/26/2015   Sertraline Rash 08/25/2015    ROS:  General: Negative for anorexia, weight loss, fever, chills, fatigue, weakness. ENT: Negative for hoarseness, difficulty swallowing , nasal congestion. CV: Negative for chest pain, angina, palpitations, dyspnea on exertion, peripheral edema.  Respiratory: Negative for dyspnea at rest, dyspnea on exertion, cough, sputum, wheezing.  GI: See history of present illness. GU:  Negative for dysuria, hematuria, urinary incontinence, urinary frequency, nocturnal urination.  Endo: Negative for unusual weight change.    Physical Examination:   BP (!) 141/78   Pulse 69   Temp (!) 97.1 F (36.2 C) (Temporal)   Ht 6' 4"  (1.93 m)   Wt 209 lb 12.8 oz (95.2 kg)   BMI 25.54 kg/m   General: Well-nourished, well-developed in no acute distress.  Eyes: No icterus. Mouth: masked. Lungs: Clear to  auscultation bilaterally.  Heart: Regular rate and rhythm, no murmurs rubs or gallops.  Abdomen: Bowel sounds are normal, nontender, nondistended, no hepatosplenomegaly or masses, no abdominal bruits or hernia , no rebound or guarding.   Extremities: No lower extremity edema. No clubbing or deformities. Neuro: Alert and oriented x 4   Skin: Warm and dry, no jaundice.  Excoriations notes mostly on the right flank area and abdominal area. Psych: Alert and cooperative, normal mood and affect.  Labs:  See hpi   Imaging Studies: No results found.   Assessment:  H/O anemia with B12 deficiency and prior iron deficiency. EGD/Colonoscopy complete. Endoscopically the duodenum appeared normal but on biopsy there was some increased intraepithelial lymphocytes which could be due to gluten sensitivity versus peptic injury versus NSAID use versus H. pylori.  Otherwise studies were unremarkable.  Last hemoglobin was 15.3 a few months back.  He continues on B12 injections but has not required any additional iron infusions.  Because of biopsy results obtained from the duodenum, he was tested for celiac disease HLA which was  positive for DQ 2 and DQ 8, risk of developing celiac disease approximately 1 out of 7 chance.  We will update TTG IgA at this time.  Check for H. pylori stool antigen.  Patient states he is currently not on PPI therapy.  Elevated alkaline phosphatase: Repeat labs.  Obtain AMA.  Doubt his pruritus is related to this finding but we need to investigate.  Pruritus: Being evaluated by dermatology, chronic findings.  Patient frustrated.  States he has been tested for allergies several times by different providers with results consistent.  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  Continues to eat gluten on a daily basis.  Financially states he cannot afford gluten-free products and depends on sandwiches because they are cheap therefore reluctant to discontinuing use of loaf bread.  Updating  celiac serologies as outlined.  Encourage patient to avoid foods that he has tested positive for.  Check for gal as well.  Plan: Alpha-Gal, H.pylori stool antigen, CBC, CMET, TTG, IgA, IgA, Hep B surface antigen, Hep C antibody, CBC.

## 2021-09-27 DIAGNOSIS — D649 Anemia, unspecified: Secondary | ICD-10-CM | POA: Diagnosis not present

## 2021-09-27 DIAGNOSIS — R634 Abnormal weight loss: Secondary | ICD-10-CM | POA: Diagnosis not present

## 2021-09-27 DIAGNOSIS — R7989 Other specified abnormal findings of blood chemistry: Secondary | ICD-10-CM | POA: Diagnosis not present

## 2021-09-27 DIAGNOSIS — D509 Iron deficiency anemia, unspecified: Secondary | ICD-10-CM | POA: Diagnosis not present

## 2021-09-27 MED ORDER — CLOBETASOL PROP EMOLLIENT BASE 0.05 % EX CREA: TOPICAL_CREAM | CUTANEOUS | 0 refills | Status: DC

## 2021-09-27 NOTE — Telephone Encounter (Signed)
Phone call to patient to with recommendations regarding his prescription. Voicemail left for patient to give the office a call back.

## 2021-09-27 NOTE — Telephone Encounter (Signed)
Phone call from patient returning our call. I informed patient that we could send in the Clobetasol to a [facility] near him and he can pay cash for the prescription for under $30.  Patient is aware and okay with that. Prescription escribed.

## 2021-09-27 NOTE — Addendum Note (Signed)
Addended by: Lennie Odor on: 09/27/2021 11:15 AM   Modules accepted: Orders

## 2021-10-01 DIAGNOSIS — K219 Gastro-esophageal reflux disease without esophagitis: Secondary | ICD-10-CM | POA: Diagnosis not present

## 2021-10-01 DIAGNOSIS — E1165 Type 2 diabetes mellitus with hyperglycemia: Secondary | ICD-10-CM | POA: Diagnosis not present

## 2021-10-03 DIAGNOSIS — R634 Abnormal weight loss: Secondary | ICD-10-CM | POA: Diagnosis not present

## 2021-10-03 DIAGNOSIS — D509 Iron deficiency anemia, unspecified: Secondary | ICD-10-CM | POA: Diagnosis not present

## 2021-10-03 DIAGNOSIS — R7989 Other specified abnormal findings of blood chemistry: Secondary | ICD-10-CM | POA: Diagnosis not present

## 2021-10-03 DIAGNOSIS — D649 Anemia, unspecified: Secondary | ICD-10-CM | POA: Diagnosis not present

## 2021-10-04 LAB — CBC WITH DIFFERENTIAL/PLATELET
Basophils Absolute: 0.1 10*3/uL (ref 0.0–0.2)
Basos: 1 %
EOS (ABSOLUTE): 0.5 10*3/uL — ABNORMAL HIGH (ref 0.0–0.4)
Eos: 6 %
Hematocrit: 47.3 % (ref 37.5–51.0)
Hemoglobin: 15.3 g/dL (ref 13.0–17.7)
Immature Grans (Abs): 0 10*3/uL (ref 0.0–0.1)
Immature Granulocytes: 0 %
Lymphocytes Absolute: 1 10*3/uL (ref 0.7–3.1)
Lymphs: 11 %
MCH: 30.2 pg (ref 26.6–33.0)
MCHC: 32.3 g/dL (ref 31.5–35.7)
MCV: 94 fL (ref 79–97)
Monocytes Absolute: 0.8 10*3/uL (ref 0.1–0.9)
Monocytes: 9 %
Neutrophils Absolute: 6.5 10*3/uL (ref 1.4–7.0)
Neutrophils: 73 %
Platelets: 257 10*3/uL (ref 150–450)
RBC: 5.06 x10E6/uL (ref 4.14–5.80)
RDW: 12.6 % (ref 11.6–15.4)
WBC: 8.9 10*3/uL (ref 3.4–10.8)

## 2021-10-04 LAB — COMPREHENSIVE METABOLIC PANEL
ALT: 35 IU/L (ref 0–44)
AST: 33 IU/L (ref 0–40)
Albumin/Globulin Ratio: 2.1 (ref 1.2–2.2)
Albumin: 4.2 g/dL (ref 3.7–4.7)
Alkaline Phosphatase: 194 IU/L — ABNORMAL HIGH (ref 44–121)
BUN/Creatinine Ratio: 20 (ref 10–24)
BUN: 20 mg/dL (ref 8–27)
Bilirubin Total: 0.3 mg/dL (ref 0.0–1.2)
CO2: 24 mmol/L (ref 20–29)
Calcium: 9.4 mg/dL (ref 8.6–10.2)
Chloride: 106 mmol/L (ref 96–106)
Creatinine, Ser: 0.98 mg/dL (ref 0.76–1.27)
Globulin, Total: 2 g/dL (ref 1.5–4.5)
Glucose: 59 mg/dL — ABNORMAL LOW (ref 70–99)
Potassium: 4.5 mmol/L (ref 3.5–5.2)
Sodium: 144 mmol/L (ref 134–144)
Total Protein: 6.2 g/dL (ref 6.0–8.5)
eGFR: 81 mL/min/{1.73_m2} (ref >59)

## 2021-10-04 LAB — ALPHA-GAL PANEL
Allergen Lamb IgE: <0.1 kU/L
Beef IgE: <0.1 kU/L
IgE (Immunoglobulin E), Serum: 115 IU/mL (ref 6–495)
O215-IgE Alpha-Gal: <0.1 kU/L
Pork IgE: <0.1 kU/L

## 2021-10-04 LAB — TISSUE TRANSGLUTAMINASE, IGA: Transglutaminase IgA: <2 U/mL (ref 0–3)

## 2021-10-04 LAB — HEPATITIS B SURFACE ANTIGEN: Hepatitis B Surface Ag: NEGATIVE

## 2021-10-04 LAB — MITOCHONDRIAL ANTIBODIES: Mitochondrial Ab: <20 Units (ref 0.0–20.0)

## 2021-10-04 LAB — IGA: IgA/Immunoglobulin A, Serum: 221 mg/dL (ref 61–437)

## 2021-10-04 LAB — HEPATITIS C ANTIBODY: Hep C Virus Ab: <0.1 s/co ratio (ref 0.0–0.9)

## 2021-10-05 LAB — H. PYLORI ANTIGEN, STOOL: H pylori Ag, Stl: NEGATIVE

## 2021-10-08 DIAGNOSIS — L282 Other prurigo: Secondary | ICD-10-CM | POA: Diagnosis not present

## 2021-10-12 DIAGNOSIS — D519 Vitamin B12 deficiency anemia, unspecified: Secondary | ICD-10-CM | POA: Diagnosis not present

## 2021-10-12 DIAGNOSIS — I10 Essential (primary) hypertension: Secondary | ICD-10-CM | POA: Diagnosis not present

## 2021-10-12 DIAGNOSIS — E039 Hypothyroidism, unspecified: Secondary | ICD-10-CM | POA: Diagnosis not present

## 2021-10-15 DIAGNOSIS — E538 Deficiency of other specified B group vitamins: Secondary | ICD-10-CM | POA: Diagnosis not present

## 2021-10-15 DIAGNOSIS — L209 Atopic dermatitis, unspecified: Secondary | ICD-10-CM | POA: Insufficient documentation

## 2021-10-15 DIAGNOSIS — R945 Abnormal results of liver function studies: Secondary | ICD-10-CM | POA: Diagnosis not present

## 2021-10-15 DIAGNOSIS — D509 Iron deficiency anemia, unspecified: Secondary | ICD-10-CM | POA: Diagnosis not present

## 2021-10-15 DIAGNOSIS — E782 Mixed hyperlipidemia: Secondary | ICD-10-CM | POA: Diagnosis not present

## 2021-10-15 DIAGNOSIS — K219 Gastro-esophageal reflux disease without esophagitis: Secondary | ICD-10-CM | POA: Diagnosis not present

## 2021-10-15 DIAGNOSIS — I10 Essential (primary) hypertension: Secondary | ICD-10-CM | POA: Diagnosis not present

## 2021-10-15 DIAGNOSIS — Z0001 Encounter for general adult medical examination with abnormal findings: Secondary | ICD-10-CM | POA: Diagnosis not present

## 2021-10-15 DIAGNOSIS — E039 Hypothyroidism, unspecified: Secondary | ICD-10-CM | POA: Diagnosis not present

## 2021-10-15 DIAGNOSIS — I482 Chronic atrial fibrillation, unspecified: Secondary | ICD-10-CM | POA: Diagnosis not present

## 2021-10-16 ENCOUNTER — Other Ambulatory Visit: Payer: Self-pay

## 2021-10-16 DIAGNOSIS — R748 Abnormal levels of other serum enzymes: Secondary | ICD-10-CM

## 2021-10-16 DIAGNOSIS — R7989 Other specified abnormal findings of blood chemistry: Secondary | ICD-10-CM

## 2021-10-17 DIAGNOSIS — R748 Abnormal levels of other serum enzymes: Secondary | ICD-10-CM | POA: Diagnosis not present

## 2021-10-17 DIAGNOSIS — R7989 Other specified abnormal findings of blood chemistry: Secondary | ICD-10-CM | POA: Diagnosis not present

## 2021-10-18 ENCOUNTER — Ambulatory Visit: Payer: Self-pay

## 2021-10-22 DIAGNOSIS — I482 Chronic atrial fibrillation, unspecified: Secondary | ICD-10-CM | POA: Diagnosis not present

## 2021-10-22 DIAGNOSIS — Z7901 Long term (current) use of anticoagulants: Secondary | ICD-10-CM | POA: Diagnosis not present

## 2021-10-22 DIAGNOSIS — E538 Deficiency of other specified B group vitamins: Secondary | ICD-10-CM | POA: Diagnosis not present

## 2021-10-22 DIAGNOSIS — L209 Atopic dermatitis, unspecified: Secondary | ICD-10-CM | POA: Diagnosis not present

## 2021-10-24 DIAGNOSIS — L209 Atopic dermatitis, unspecified: Secondary | ICD-10-CM | POA: Diagnosis not present

## 2021-10-31 DIAGNOSIS — I10 Essential (primary) hypertension: Secondary | ICD-10-CM | POA: Diagnosis not present

## 2021-10-31 DIAGNOSIS — E782 Mixed hyperlipidemia: Secondary | ICD-10-CM | POA: Diagnosis not present

## 2021-11-05 DIAGNOSIS — Z7901 Long term (current) use of anticoagulants: Secondary | ICD-10-CM | POA: Diagnosis not present

## 2021-11-05 DIAGNOSIS — E538 Deficiency of other specified B group vitamins: Secondary | ICD-10-CM | POA: Diagnosis not present

## 2021-11-06 DIAGNOSIS — L209 Atopic dermatitis, unspecified: Secondary | ICD-10-CM | POA: Diagnosis not present

## 2021-11-16 ENCOUNTER — Telehealth: Payer: Self-pay | Admitting: Internal Medicine

## 2021-11-16 ENCOUNTER — Other Ambulatory Visit: Payer: Self-pay

## 2021-11-16 NOTE — Telephone Encounter (Signed)
Unsure who called pt but informed pt that if it is important whoever may have called will call back.

## 2021-11-16 NOTE — Patient Outreach (Signed)
[place] [facility]) Care Management  11/16/2021  TREYCE SPILLERS [birth year] [id number]   Telephone Assessment    Unsuccessful outreach attempt to patient. No answer at present. RN CM left HIPAA compliant voicemail along with contact info.     Plan: RN CM will make outreach attempt to patient within the month of Feb if no return call.    [name], RN,BSN,CCM [place] Management Telephonic Care Management Coordinator Direct Phone: [phone] Toll Free: [phone] Fax: [phone]

## 2021-11-16 NOTE — Telephone Encounter (Signed)
Pt said he was returning a call. He thinks it's regarding a test result. I told him I wasn't sure who called him and I would let the nurse be aware. Then he said he wasn't sure how to answer his cellphone and he would either call back or come to office. [phone]

## 2021-11-19 DIAGNOSIS — Z7901 Long term (current) use of anticoagulants: Secondary | ICD-10-CM | POA: Diagnosis not present

## 2021-11-19 DIAGNOSIS — I482 Chronic atrial fibrillation, unspecified: Secondary | ICD-10-CM | POA: Diagnosis not present

## 2021-11-22 DIAGNOSIS — Z7901 Long term (current) use of anticoagulants: Secondary | ICD-10-CM | POA: Diagnosis not present

## 2021-11-22 DIAGNOSIS — I482 Chronic atrial fibrillation, unspecified: Secondary | ICD-10-CM | POA: Diagnosis not present

## 2021-11-27 DIAGNOSIS — Z7901 Long term (current) use of anticoagulants: Secondary | ICD-10-CM | POA: Diagnosis not present

## 2021-11-27 DIAGNOSIS — I482 Chronic atrial fibrillation, unspecified: Secondary | ICD-10-CM | POA: Diagnosis not present

## 2021-11-28 ENCOUNTER — Other Ambulatory Visit: Payer: Self-pay

## 2021-11-28 ENCOUNTER — Telehealth: Payer: Self-pay | Admitting: Internal Medicine

## 2021-11-28 ENCOUNTER — Encounter: Payer: Self-pay | Admitting: Gastroenterology

## 2021-11-28 DIAGNOSIS — R748 Abnormal levels of other serum enzymes: Secondary | ICD-10-CM

## 2021-11-28 DIAGNOSIS — R7989 Other specified abnormal findings of blood chemistry: Secondary | ICD-10-CM

## 2021-11-28 NOTE — Telephone Encounter (Signed)
See result note.  

## 2021-11-28 NOTE — Telephone Encounter (Signed)
Patient said someone from here called him, please call back  

## 2021-11-29 DIAGNOSIS — Z9889 Other specified postprocedural states: Secondary | ICD-10-CM | POA: Diagnosis not present

## 2021-11-29 DIAGNOSIS — E538 Deficiency of other specified B group vitamins: Secondary | ICD-10-CM | POA: Diagnosis not present

## 2021-11-30 DIAGNOSIS — E782 Mixed hyperlipidemia: Secondary | ICD-10-CM | POA: Diagnosis not present

## 2021-11-30 DIAGNOSIS — I10 Essential (primary) hypertension: Secondary | ICD-10-CM | POA: Diagnosis not present

## 2021-12-06 DIAGNOSIS — E538 Deficiency of other specified B group vitamins: Secondary | ICD-10-CM | POA: Diagnosis not present

## 2021-12-12 DIAGNOSIS — L282 Other prurigo: Secondary | ICD-10-CM | POA: Diagnosis not present

## 2021-12-26 DIAGNOSIS — L209 Atopic dermatitis, unspecified: Secondary | ICD-10-CM | POA: Diagnosis not present

## 2022-01-03 DIAGNOSIS — D519 Vitamin B12 deficiency anemia, unspecified: Secondary | ICD-10-CM | POA: Diagnosis not present

## 2022-01-03 DIAGNOSIS — E538 Deficiency of other specified B group vitamins: Secondary | ICD-10-CM | POA: Diagnosis not present

## 2022-01-08 ENCOUNTER — Other Ambulatory Visit: Payer: Self-pay | Admitting: *Deleted

## 2022-01-08 DIAGNOSIS — R7989 Other specified abnormal findings of blood chemistry: Secondary | ICD-10-CM

## 2022-01-08 DIAGNOSIS — R748 Abnormal levels of other serum enzymes: Secondary | ICD-10-CM

## 2022-01-15 ENCOUNTER — Other Ambulatory Visit: Payer: Self-pay

## 2022-01-15 NOTE — Patient Outreach (Signed)
[place] [facility]) Care Management  01/15/2022  William Sandoval November 13, 1946 [id number]   Telephone Assessment    Unsuccessful outreach attempt to patient. No answer. RN CM left HIPAA compliant voicemail message along with contact info.     Plan: RN CM will make outreach attempt within the month of March if return call. RN CM will send unsuccessful outreach letter to patient.   [name], RN,BSN,CCM [place] Management Telephonic Care Management Coordinator Direct Phone: [phone] Toll Free: [phone] Fax: [phone]

## 2022-01-17 DIAGNOSIS — R748 Abnormal levels of other serum enzymes: Secondary | ICD-10-CM | POA: Diagnosis not present

## 2022-02-01 DIAGNOSIS — D519 Vitamin B12 deficiency anemia, unspecified: Secondary | ICD-10-CM | POA: Diagnosis not present

## 2022-02-21 DIAGNOSIS — H25813 Combined forms of age-related cataract, bilateral: Secondary | ICD-10-CM | POA: Diagnosis not present

## 2022-02-22 ENCOUNTER — Other Ambulatory Visit: Payer: Self-pay

## 2022-02-22 NOTE — Patient Outreach (Signed)
[place] [facility]) Care Management ? ?02/22/2022 ? ?William Sandoval ?1946/01/05 ?[id number] ? ? ?Telephone Assessment ? ? ? ?Unsuccessful outreach attempt to patient. RN CM left HIPAA compliant voicemail along with contact info. ? ? ? ? ?Plan: ?RN CM will make outreach attempt within the month of May. ? ? ?[name], RN,BSN,CCM ?[facility] Care Management ?Telephonic Care Management Coordinator ?Direct Phone: [phone] ?Toll Free: [phone] ?Fax: [phone] ? ?

## 2022-03-04 ENCOUNTER — Other Ambulatory Visit: Payer: Self-pay

## 2022-03-04 NOTE — Patient Outreach (Signed)
[place] [facility]) Care Management ? ?03/04/2022 ? ?William Sandoval ?1946-10-13 ?[id number] ? ? ?Telephone Assessment ? ? ? ? ?Voicemail message received from patient. Return call placed to patient. Spoke with patient who reports that he has missed RN CM calls several times as he does not always hear his phone rings. Patient continues to reside in his home alone. He remains independent with ADLS/IADLs. Denies any recent falls. Appetite good. Wgt stable. Denies any RN CM needs or concerns at this time.  ? ? ? ?Medications Reviewed Today   ? ? Reviewed by [name], RN (Registered Nurse) on 03/04/22 at [place] List Status: <None>  ? ?Medication Order Taking? Sig Documenting Provider Last Dose Status Informant  ?acetaminophen (TYLENOL) 650 MG CR tablet [id number] No Take 650-1,300 mg by mouth every 8 (eight) hours as needed for pain. [provider] Taking Active Self  ?         ?Med Note Sharene Butters   Tue Nov 14, 2020  3:26 PM) rarely  ?atorvastatin (LIPITOR) 40 MG tablet [id number] No Take 40 mg by mouth daily. [provider] Taking Active Self  ?camphor-menthol Timoteo Ace) lotion [id number] No Apply 1 application topically as needed for itching. [provider] Taking Active Self  ?Clobetasol Prop Emollient Base (CLOBETASOL PROPIONATE E) 0.05 % emollient cream [id number]  APPLY TO AFFECTED AREA AFTER Joanna Hews, MD  Active   ?Cyanocobalamin (B-12 COMPLIANCE INJECTION IJ) [id number] No Inject as directed. [provider] Taking Active Self  ?levothyroxine (SYNTHROID) 112 MCG tablet [id number] No Take 112 mcg by mouth daily before breakfast. [provider] Taking Active Self  ?loratadine (CLARITIN) 10 MG tablet [id number] No Take 20 mg by mouth daily. [provider] Taking Active Self  ?metoprolol succinate (TOPROL-XL) 50 MG 24 hr tablet [id number] No Take 50 mg by mouth daily. [provider] Taking Active Self   ?Multiple Vitamin (MULTIVITAMIN WITH MINERALS) TABS tablet [id number] No Take 1 tablet by mouth daily. VitaFusion Men's Multivitamin [provider] Taking Active Self  ?omeprazole (PRILOSEC) 20 MG capsule [id number] No Take 1 capsule (20 mg total) by mouth daily. Mahala Menghini, PA-C Taking Active   ?triamcinolone (KENALOG) 0.1 % [id number] No Apply 1 application topically 2 (two) times daily as needed (rash). [provider] Taking Active Self  ?triamcinolone cream (KENALOG) 0.5 % [id number] No  [provider] Taking Active   ?warfarin (COUMADIN) 5 MG tablet [id number] No Take 5-7.5 mg by mouth See admin instructions. Take 1.5 tablets (7.5 mg) on Mondays, Wednesdays, Fridays, & Saturdays then take 1 tablet (5 mg) by mouth on all other days. [provider] Taking Active Self  ? ?  ?  ? ?  ?  ? ? ?  09/26/2020  ?  8:59 AM 04/05/2021  ? 11:25 AM 06/12/2021  ? 12:22 PM 09/04/2021  ? 11:19 AM 03/04/2022  ? 10:34 AM  ?Fall Risk  ?Falls in the past year?  0 0 0 0  ?Was there an injury with Fall?  0 0 0 0  ?Fall Risk Category Calculator  0 0 0 0  ?Fall Risk Category  Low Low Low Low  ?Patient Fall Risk Level Moderate fall risk Low fall risk Low fall risk Low fall risk Low fall risk  ?Patient at Risk for Falls Due to  Medication side effect Medication side effect Medication side effect Medication side effect  ?Fall risk Follow up  Education provided;Falls evaluation completed Falls evaluation  completed Falls evaluation completed Falls evaluation completed;Education provided  ?  ? ?  03/04/2022  ? 10:32 AM 06/12/2021  ? 12:22 PM 06/07/2020  ? 10:45 AM 09/20/2019  ? 11:24 AM  ?Depression screen PHQ 2/9  ?Decreased Interest 0 0 0 0  ?Down, Depressed, Hopeless 0 0 0 0  ?PHQ - 2 Score 0 0 0 0  ?  ?SDOH Screenings  ? ?Alcohol Screen: Not on file  ?Depression (PHQ2-9): Low Risk   ? PHQ-2 Score: 0  ?Financial Resource Strain: Not on file  ?Food Insecurity: No Food Insecurity  ? Worried About Sales executive in the Last Year: Never true  ? Ran Out of Food in the Last Year: Never true  ?Housing: Not on file  ?Physical Activity: Not on file  ?Social Connections: Not on file  ?Stress: Not on file  ?Tobacco Use: Low Risk   ? Smoking Tobacco Use: Never  ? Smokeless Tobacco Use: Never  ? Passive Exposure: Not on file  ?Transportation Needs: No Transportation Needs  ? Lack of Transportation (Medical): No  ? Lack of Transportation (Non-Medical): No  ?  ?Care Plan : General Plan of Care (Adult)  ?Updates made by [name], RN since 03/04/2022 12:00 AM  ?  ? ?Problem: Skin Integrity   ?  ? ?Long-Range Goal: Skin Integrity Maintained-Patient willreport improvements in sxs over the next 3-67month Completed 03/04/2022  ?Start Date: 09/04/2021  ?Expected End Date: 12/31/2021  ?Priority: High  ?Note:   ? ?Notes:  ?09/04/21 ?RN CM instructed of s/s of worsening condition and when to seek medical care. ?RN CM assessed for med adherence. ?RN CM confirmed pt has MD follow up appt. ? ?4/3//23-Patient reports sxs have resolved.  ?  ? ? ?Care Plan : RN Care Manager POC  ?Updates made by FHayden Pedro RN since 03/04/2022 12:00 AM  ?  ? ?Problem: Chronic Disease Mgmt of Chronic Condition-   ?Priority: High  ?  ? ?Long-Range Goal: Development of POC for Mgmt of Chronic Condition-rotator cuff repair, GI issues   ?Start Date: 03/04/2022  ?Expected End Date: 03/05/2023  ?Priority: High  ?Note:   ? ?Current Barriers:  ?Chronic Disease Management support and education needs related to rotator repair and GI issues  ? ?RNCM Clinical Goal(s):  ?Patient will verbalize understanding of plan for management of chronic conditions as evidenced by adherence to POC ?continue to work with RN Care Manager to address care management and care coordination needs related to  rotator issues and GI issues as evidenced by adherence to CM Team Scheduled appointments through collaboration with RN Care manager, provider, and care team.   ? ?Interventions: ?POC sent to PCP upon initial assessment, quarterly and with any changes in patient's conditions ?Inter-disciplinary care team collaboration (see longitudinal plan of care) ?Evaluation of current treatment plan related to  self management and patient's adherence to plan as established by provider ? ? ?Rotator Cuff Issues  (Status:  New goal.)  Long Term Goal ?Evaluation of current treatment plan related to  rotator issues ,  self-management and patient's adherence to plan as established by provider. ?Discussed plans with patient for ongoing care management follow up and provided patient with direct contact information for care management team ?Evaluation of current treatment plan related to rotator issues and patient's adherence to plan as established by provider ?Advised patient to seek medical attention for any worsening and/or unresolved issues ? ?GI Issues  (Status:  New goal.)  Long Term  Goal ?Evaluation of current treatment plan related to  GI issues ,  self-management and patient's adherence to plan as established by provider. ?Discussed plans with patient for ongoing care management follow up and provided patient with direct contact information for care management team ?Evaluation of current treatment plan related to GI issues and patient's adherence to plan as established by provider ?Advised patient to seek medical attention for any worsening conditions ?Reviewed medications with patient and discussed importance of adherence   ? ? ? ?Patient Goals/Self-Care Activities: ?Take all medications as prescribed ?Attend all scheduled provider appointments ?Perform all self care activities independently  ?Call provider office for new concerns or questions  ? ?Follow Up Plan:  Telephone follow up appointment with care management team member scheduled for:  within the month of June ?The patient has been provided with contact information for the care management team and has been advised to call with  any health related questions or concerns.   ?  ?  ? ? ? ?Plan: ?RN CM discussed with patient next outreach within the month of June. Patient agrees to care plan and follow up. ?RN CM will send quarterly update to PCP.

## 2022-03-11 DIAGNOSIS — E538 Deficiency of other specified B group vitamins: Secondary | ICD-10-CM | POA: Diagnosis not present

## 2022-03-22 ENCOUNTER — Ambulatory Visit (INDEPENDENT_AMBULATORY_CARE_PROVIDER_SITE_OTHER): Payer: Medicare HMO | Admitting: Gastroenterology

## 2022-03-22 ENCOUNTER — Encounter: Payer: Self-pay | Admitting: Gastroenterology

## 2022-03-22 VITALS — BP 128/80 | HR 72 | Temp 97.3°F | Ht 76.0 in | Wt 215.6 lb

## 2022-03-22 DIAGNOSIS — D509 Iron deficiency anemia, unspecified: Secondary | ICD-10-CM | POA: Diagnosis not present

## 2022-03-22 DIAGNOSIS — R7989 Other specified abnormal findings of blood chemistry: Secondary | ICD-10-CM | POA: Diagnosis not present

## 2022-03-22 NOTE — Progress Notes (Signed)
? ? ? ?GI Office Note   ? ?Referring Provider: Celene Squibb, MD ?Primary Care Physician:  Celene Squibb, MD  ?Primary Gastroenterologist: Elon Alas. Abbey Chatters, DO ? ? ?Chief Complaint  ? ?Chief Complaint  ?Patient presents with  ? Follow-up  ?  No current issues to discuss.   ? ? ?History of Present Illness  ? ?William Sandoval is a 76 y.o. male presenting today for follow-up.  Patient was last seen October 2022.  History of A-fib on Coumadin, anemia, weight loss. ? ?December 2021, celiac serologies negative, B12 73, iron 91, ferritin 685, folate 19.3, hemoglobin 12.6.  Repeat ferritin December 2021 remained elevated at 606.February 2022 his hemoglobin was normal at 14.2, iron 63, iron saturation is 34%, TIBC 188, ferritin remained elevated at 546.  HFE genetics were negative.  He completed EGD and colonoscopy as outlined below. B12 monthly injections.   ? ?Received copy of labs dated 04/20/2021: Vitamin B12 460, TSH 3.360, A1c 5.6, glucose 95, creatinine 0.8, albumin 3.7, total bilirubin 0.5, alkaline phosphatase 216, AST 27, ALT 25, white blood cell count 9300, hemoglobin 15.3, platelets 256,000.  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 ?  ?After last visit he had updated labs in October 2022.  Alpha gal was negative.  Stool test negative for H. pylori.  Celiac serologies remain negative.  AMA negative, hepatitis B and C negative, alk phos remained elevated at 194, other LFTs normal. GGT normal.  Patient reminds me he has seen dermatology and allergy specialist several times, stating that allergy testing for labs have been positive but intradermal testing has been unremarkable.  States he is allergic to perfume.  Continues to avoid gluten and shrimp, especially this shellfish because he believes this is what made him develop such severe pruritus last year.  I do not see any recent records. ? ?Labs from January 17, 2022: Total  bilirubin 0.6, alkaline phosphatase elevated at 171, AST 30, ALT 24, albumin 3.9. ? ?Today: no heartburn or regurgitation. No longer on omeprazole. No abdominal pain. BM regular. No melena, brbpr. Weight is up six pounds since last ov. Still on B12 injections, monthly. Has appointment with PCP in May, will be due for labs at that time. Itching has resolved. Avoiding shellfish, thinks it was related.  ? ? ?Wt Readings from Last 3 Encounters:  ?03/22/22 215 lb 9.6 oz (97.8 kg)  ?09/26/21 209 lb 12.8 oz (95.2 kg)  ?08/01/21 207 lb 3.2 oz (94 kg)  ? ? ?Colonoscopy November 20, 2020: ?-Non-bleeding internal hemorrhoids. ?- Diverticulosis in the sigmoid colon and in the descending colon. ?- There was significant looping of the colon. ?- The examination was otherwise normal. ?- No specimens collected. ?  ?EGD November 20, 2020: ?- Z-line irregular, 40 cm from the incisors. Biopsied (reflux changes). ?- Normal stomach. ?- Normal duodenal bulb, first portion of the duodenum and second portion of the ?duodenum. Biopsied (increased intraepithelial lymphocytes but no significant villous blunting, possible gluten sensitivity versus peptic injury versus NSAID injury versus H. Pylori). ?  ?  ?Medications  ? ?Current Outpatient Medications  ?Medication Sig Dispense Refill  ? acetaminophen (TYLENOL) 650 MG CR tablet Take 650-1,300 mg by mouth every 8 (eight) hours as needed for pain.    ? atorvastatin (LIPITOR) 40 MG tablet Take 40 mg by mouth daily.    ? camphor-menthol (SARNA) lotion Apply 1 application topically as needed  for itching.    ? Cyanocobalamin (B-12 COMPLIANCE INJECTION IJ) Inject as directed.    ? levothyroxine (SYNTHROID) 112 MCG tablet Take 112 mcg by mouth daily before breakfast.    ? metoprolol succinate (TOPROL-XL) 50 MG 24 hr tablet Take 50 mg by mouth daily.    ? Multiple Vitamin (MULTIVITAMIN WITH MINERALS) TABS tablet Take 1 tablet by mouth daily. VitaFusion Men's Multivitamin    ? warfarin (COUMADIN) 5 MG  tablet Take 5-7.5 mg by mouth See admin instructions. Take 1.5 tablets (7.5 mg) on Mondays, Wednesdays, Fridays, & Saturdays then take 1 tablet (5 mg) by mouth on all other days.    ? ?No current facility-administered medications for this visit.  ? ? ?Allergies  ? ?Allergies as of 03/22/2022 - Review Complete 03/22/2022  ?Allergen Reaction Noted  ? Gluten meal Other (See Comments) 08/26/2015  ? Shrimp [shellfish allergy] Other (See Comments) 08/26/2015  ? Sertraline Rash 08/25/2015  ? ?   ?Review of Systems  ? ?General: Negative for anorexia, weight loss, fever, chills, fatigue, weakness. ?ENT: Negative for hoarseness, difficulty swallowing , nasal congestion. ?CV: Negative for chest pain, angina, palpitations, dyspnea on exertion, peripheral edema.  ?Respiratory: Negative for dyspnea at rest, dyspnea on exertion, cough, sputum, wheezing.  ?GI: See history of present illness. ?GU:  Negative for dysuria, hematuria, urinary incontinence, urinary frequency, nocturnal urination.  ?Endo: Negative for unusual weight change.  ?   ?Physical Exam  ? ?BP 128/80 (BP Location: Right Arm, Patient Position: Sitting, Cuff Size: Normal)   Pulse 72   Temp (!) 97.3 ?F (36.3 ?C) (Temporal)   Ht 6' 4"  (1.93 m)   Wt 215 lb 9.6 oz (97.8 kg)   BMI 26.24 kg/m?  ?  ?General: Well-nourished, well-developed in no acute distress.  ?Eyes: No icterus. ?Mouth: Oropharyngeal mucosa moist and pink , no lesions erythema or exudate. ?Lungs: Clear to auscultation bilaterally.  ?Heart: Regular rate and rhythm, no murmurs rubs or gallops.  ?Abdomen: Bowel sounds are normal, nontender, nondistended, no hepatosplenomegaly or masses,  ?no abdominal bruits or hernia , no rebound or guarding.  ?Rectal: not performed  ?Extremities: No lower extremity edema. No clubbing or deformities. ?Neuro: Alert and oriented x 4   ?Skin: Warm and dry, no jaundice.   ?Psych: Alert and cooperative, normal mood and affect. ? ?Labs  ? ?Lab Results  ?Component Value Date   ? CREATININE 0.98 09/27/2021  ? BUN 20 09/27/2021  ? NA 144 09/27/2021  ? K 4.5 09/27/2021  ? CL 106 09/27/2021  ? CO2 24 09/27/2021  ? ?Lab Results  ?Component Value Date  ? ALT 35 09/27/2021  ? AST 33 09/27/2021  ? ALKPHOS 194 (H) 09/27/2021  ? BILITOT 0.3 09/27/2021  ? ?Lab Results  ?Component Value Date  ? WBC 8.9 09/27/2021  ? HGB 15.3 09/27/2021  ? HCT 47.3 09/27/2021  ? MCV 94 09/27/2021  ? PLT 257 09/27/2021  ? ? ?Imaging Studies  ? ?No results found. ? ?Assessment  ? ?History of anemia: History of B12 and previously iron deficiency.  Ferritin has been markedly elevated, HFE genetics negative.  EGD and colonoscopy completed as outlined.  Endoscopically the duodenum appeared normal but on biopsy there was increased intraepithelial lymphocytes which could be seen with gluten sensitivity versus peptic injury versus NSAID versus H. pylori.  H. pylori stool antigen was negative.  Celiac serologies negative.  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  Continue to follow.  Hemoglobin normal back in October.  Have requested most recent labs from PCP. ? ?Elevated alkaline phosphatase: AMA negative. Hepatitis B&C negative, GGT normal. Will continue to monitor for now. It is likely the elevated alk phos is from source other than liver. Most recently improved.  ? ?Ferritin elevated: persistently elevated, HFE genetics negative. Iron sats borderline elevated remotely. Will update labs.  ? ? ? ? ?PLAN  ? ?Will request last labs from PCP and upcoming labs as well. ?Return ov in six months with Dr. Abbey Chatters.  ? ? ?William Sandoval, MHS, PA-C ?[facility] ? ?

## 2022-03-22 NOTE — Patient Instructions (Signed)
I will get a copy of your last labs from Dr. Nevada Crane and review. We will let you know if additional labs are needed. We will also look at upcoming labs you have planned with Dr. Nevada Crane. ?Return to the office in six months or call sooner if needed.  ?

## 2022-03-29 ENCOUNTER — Telehealth: Payer: Self-pay | Admitting: Gastroenterology

## 2022-03-29 ENCOUNTER — Encounter: Payer: Self-pay | Admitting: Gastroenterology

## 2022-03-29 NOTE — Telephone Encounter (Signed)
Please let pt know, I reviewed his LFTs from 01/2022.  ? ?I would like for the following labs to be done at his convenience, can be done with next PCP labs.  ? ?CBC, LFTs, iron/tibc/ferritin.  ?Dx: anemia, elevated lfts ?

## 2022-04-01 ENCOUNTER — Other Ambulatory Visit: Payer: Self-pay

## 2022-04-01 ENCOUNTER — Telehealth: Payer: Self-pay | Admitting: Internal Medicine

## 2022-04-01 DIAGNOSIS — R7989 Other specified abnormal findings of blood chemistry: Secondary | ICD-10-CM

## 2022-04-01 DIAGNOSIS — D649 Anemia, unspecified: Secondary | ICD-10-CM

## 2022-04-01 NOTE — Telephone Encounter (Signed)
Labs have been ordered and will fax them to Dr. Juel Burrow office for pt to have completed as well as a copy of the orders mailed to the patient.  ?

## 2022-04-01 NOTE — Telephone Encounter (Signed)
See other note

## 2022-04-01 NOTE — Telephone Encounter (Signed)
Lmom for pt to return my call.  

## 2022-04-01 NOTE — Telephone Encounter (Signed)
Pt returning call. [phone] ?

## 2022-04-01 NOTE — Telephone Encounter (Signed)
Patient called in. He has an appointment with PCP on 04/11/22 and requested if he can have the additional lab work needed to sent to them so he can have done at that time. Dr. Juel Burrow office. No return call needed. ?

## 2022-04-05 DIAGNOSIS — E538 Deficiency of other specified B group vitamins: Secondary | ICD-10-CM | POA: Diagnosis not present

## 2022-04-11 DIAGNOSIS — E039 Hypothyroidism, unspecified: Secondary | ICD-10-CM | POA: Diagnosis not present

## 2022-04-11 DIAGNOSIS — E538 Deficiency of other specified B group vitamins: Secondary | ICD-10-CM | POA: Diagnosis not present

## 2022-04-11 DIAGNOSIS — E782 Mixed hyperlipidemia: Secondary | ICD-10-CM | POA: Diagnosis not present

## 2022-04-11 DIAGNOSIS — D649 Anemia, unspecified: Secondary | ICD-10-CM | POA: Diagnosis not present

## 2022-04-15 ENCOUNTER — Telehealth: Payer: Self-pay

## 2022-04-15 DIAGNOSIS — D509 Iron deficiency anemia, unspecified: Secondary | ICD-10-CM | POA: Diagnosis not present

## 2022-04-15 DIAGNOSIS — R748 Abnormal levels of other serum enzymes: Secondary | ICD-10-CM

## 2022-04-15 DIAGNOSIS — L209 Atopic dermatitis, unspecified: Secondary | ICD-10-CM | POA: Diagnosis not present

## 2022-04-15 DIAGNOSIS — E538 Deficiency of other specified B group vitamins: Secondary | ICD-10-CM | POA: Diagnosis not present

## 2022-04-15 DIAGNOSIS — E039 Hypothyroidism, unspecified: Secondary | ICD-10-CM | POA: Diagnosis not present

## 2022-04-15 DIAGNOSIS — I482 Chronic atrial fibrillation, unspecified: Secondary | ICD-10-CM | POA: Diagnosis not present

## 2022-04-15 DIAGNOSIS — E782 Mixed hyperlipidemia: Secondary | ICD-10-CM | POA: Diagnosis not present

## 2022-04-15 DIAGNOSIS — R945 Abnormal results of liver function studies: Secondary | ICD-10-CM | POA: Diagnosis not present

## 2022-04-15 DIAGNOSIS — K219 Gastro-esophageal reflux disease without esophagitis: Secondary | ICD-10-CM | POA: Diagnosis not present

## 2022-04-15 DIAGNOSIS — I10 Essential (primary) hypertension: Secondary | ICD-10-CM | POA: Diagnosis not present

## 2022-04-15 NOTE — Telephone Encounter (Signed)
Labs from PCP came in via fax, in box for review.  ?

## 2022-04-24 ENCOUNTER — Ambulatory Visit: Payer: Self-pay

## 2022-04-30 NOTE — Telephone Encounter (Signed)
Reviewed labs from Apr 11, 2022:  Serum iron 72, iron saturation is 32%, TIBC 225, ferritin 370 normal, TSH 8.550, white blood cell count 8100, hemoglobin 15.1, platelets 204,000, glucose 93, creatinine 0.9, total bilirubin 0.5, alkaline phosphatase 199 elevated, AST 30, ALT 28, albumin 4.   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 Marland Kitchen AMA has been negative. GGT normal. No recent US abd.    Given persistently significantly elevated AlkPhos would recommend ruq u/s.

## 2022-05-01 DIAGNOSIS — E538 Deficiency of other specified B group vitamins: Secondary | ICD-10-CM | POA: Diagnosis not present

## 2022-05-01 DIAGNOSIS — D519 Vitamin B12 deficiency anemia, unspecified: Secondary | ICD-10-CM | POA: Diagnosis not present

## 2022-05-01 NOTE — Telephone Encounter (Signed)
Lmom for pt to return my call.  

## 2022-05-01 NOTE — Telephone Encounter (Signed)
RUQ scheduled for 6/9 at 10:30am, arrival 10:15am, npo midnight  Called pt, LMOVM to call back

## 2022-05-01 NOTE — Addendum Note (Signed)
Addended by: Cheron Every on: 05/01/2022 04:06 PM   Modules accepted: Orders

## 2022-05-01 NOTE — Telephone Encounter (Signed)
Pt was made aware and verbalized understanding. Pt is ready to move forward with recommended u/s

## 2022-05-02 NOTE — Telephone Encounter (Signed)
LMOVM with appt details.

## 2022-05-02 NOTE — Telephone Encounter (Signed)
Pt called office, informed him of [place] appt. Letter mailed.

## 2022-05-09 ENCOUNTER — Ambulatory Visit (HOSPITAL_COMMUNITY)
Admission: RE | Admit: 2022-05-09 | Discharge: 2022-05-09 | Disposition: A | Discharge status: Home / Self Care | Payer: Medicare HMO | Source: Ambulatory Visit | Attending: Gastroenterology | Admitting: Gastroenterology

## 2022-05-09 DIAGNOSIS — R748 Abnormal levels of other serum enzymes: Secondary | ICD-10-CM | POA: Diagnosis not present

## 2022-05-10 ENCOUNTER — Ambulatory Visit (HOSPITAL_COMMUNITY): Admission: RE | Admit: 2022-05-10 | Payer: Medicare HMO | Source: Ambulatory Visit

## 2022-05-22 ENCOUNTER — Encounter: Payer: Self-pay | Admitting: Internal Medicine

## 2022-05-27 ENCOUNTER — Other Ambulatory Visit: Payer: Self-pay

## 2022-06-13 DIAGNOSIS — I482 Chronic atrial fibrillation, unspecified: Secondary | ICD-10-CM | POA: Diagnosis not present

## 2022-06-13 DIAGNOSIS — L209 Atopic dermatitis, unspecified: Secondary | ICD-10-CM | POA: Diagnosis not present

## 2022-06-13 DIAGNOSIS — E782 Mixed hyperlipidemia: Secondary | ICD-10-CM | POA: Diagnosis not present

## 2022-06-13 DIAGNOSIS — I10 Essential (primary) hypertension: Secondary | ICD-10-CM | POA: Diagnosis not present

## 2022-06-13 DIAGNOSIS — K219 Gastro-esophageal reflux disease without esophagitis: Secondary | ICD-10-CM | POA: Diagnosis not present

## 2022-06-13 DIAGNOSIS — E039 Hypothyroidism, unspecified: Secondary | ICD-10-CM | POA: Diagnosis not present

## 2022-06-13 DIAGNOSIS — D509 Iron deficiency anemia, unspecified: Secondary | ICD-10-CM | POA: Diagnosis not present

## 2022-06-13 DIAGNOSIS — E538 Deficiency of other specified B group vitamins: Secondary | ICD-10-CM | POA: Diagnosis not present

## 2022-06-13 DIAGNOSIS — R945 Abnormal results of liver function studies: Secondary | ICD-10-CM | POA: Diagnosis not present

## 2022-07-22 DIAGNOSIS — E039 Hypothyroidism, unspecified: Secondary | ICD-10-CM | POA: Diagnosis not present

## 2022-07-31 ENCOUNTER — Other Ambulatory Visit: Payer: Self-pay

## 2022-07-31 NOTE — Patient Outreach (Signed)
[facility]) Care Management  07/31/2022  William Sandoval 25-Jun-1946 [id number]     Case Closure  Case is being transferred to [place] services. Assigned RN CM will outreach and follow up with patient.    [name], RN,BSN,CCM [place] [facility] Telephonic Care Management Coordinator Direct Phone: [phone] Toll Free: [phone] Fax: [phone]

## 2022-08-12 ENCOUNTER — Ambulatory Visit: Payer: Medicare HMO

## 2022-08-19 DIAGNOSIS — R945 Abnormal results of liver function studies: Secondary | ICD-10-CM | POA: Diagnosis not present

## 2022-08-19 DIAGNOSIS — E039 Hypothyroidism, unspecified: Secondary | ICD-10-CM | POA: Diagnosis not present

## 2022-09-23 ENCOUNTER — Ambulatory Visit: Payer: Medicare HMO | Admitting: Gastroenterology

## 2022-10-09 DIAGNOSIS — E782 Mixed hyperlipidemia: Secondary | ICD-10-CM | POA: Diagnosis not present

## 2022-10-09 DIAGNOSIS — E538 Deficiency of other specified B group vitamins: Secondary | ICD-10-CM | POA: Diagnosis not present

## 2022-10-09 DIAGNOSIS — E039 Hypothyroidism, unspecified: Secondary | ICD-10-CM | POA: Diagnosis not present

## 2022-10-14 DIAGNOSIS — I482 Chronic atrial fibrillation, unspecified: Secondary | ICD-10-CM | POA: Diagnosis not present

## 2022-10-14 DIAGNOSIS — E538 Deficiency of other specified B group vitamins: Secondary | ICD-10-CM | POA: Diagnosis not present

## 2022-10-14 DIAGNOSIS — K219 Gastro-esophageal reflux disease without esophagitis: Secondary | ICD-10-CM | POA: Diagnosis not present

## 2022-10-14 DIAGNOSIS — E782 Mixed hyperlipidemia: Secondary | ICD-10-CM | POA: Diagnosis not present

## 2022-10-14 DIAGNOSIS — D509 Iron deficiency anemia, unspecified: Secondary | ICD-10-CM | POA: Diagnosis not present

## 2022-10-14 DIAGNOSIS — R945 Abnormal results of liver function studies: Secondary | ICD-10-CM | POA: Diagnosis not present

## 2022-10-14 DIAGNOSIS — I10 Essential (primary) hypertension: Secondary | ICD-10-CM | POA: Diagnosis not present

## 2022-10-14 DIAGNOSIS — E039 Hypothyroidism, unspecified: Secondary | ICD-10-CM | POA: Diagnosis not present

## 2022-10-14 DIAGNOSIS — Z23 Encounter for immunization: Secondary | ICD-10-CM | POA: Diagnosis not present

## 2022-11-04 ENCOUNTER — Ambulatory Visit: Payer: Medicare HMO | Admitting: Gastroenterology

## 2022-11-04 DIAGNOSIS — E538 Deficiency of other specified B group vitamins: Secondary | ICD-10-CM | POA: Diagnosis not present

## 2022-11-04 DIAGNOSIS — I10 Essential (primary) hypertension: Secondary | ICD-10-CM | POA: Diagnosis not present

## 2022-11-04 DIAGNOSIS — L209 Atopic dermatitis, unspecified: Secondary | ICD-10-CM | POA: Diagnosis not present

## 2022-11-04 DIAGNOSIS — D6869 Other thrombophilia: Secondary | ICD-10-CM | POA: Diagnosis not present

## 2022-11-04 DIAGNOSIS — I482 Chronic atrial fibrillation, unspecified: Secondary | ICD-10-CM | POA: Diagnosis not present

## 2022-11-05 ENCOUNTER — Ambulatory Visit: Payer: Medicare HMO | Admitting: Gastroenterology

## 2022-11-05 ENCOUNTER — Encounter: Payer: Self-pay | Admitting: Gastroenterology

## 2022-11-05 ENCOUNTER — Telehealth: Payer: Self-pay | Admitting: Gastroenterology

## 2022-11-05 VITALS — BP 134/79 | HR 59 | Temp 97.2°F | Ht 76.0 in | Wt 212.2 lb

## 2022-11-05 DIAGNOSIS — L299 Pruritus, unspecified: Secondary | ICD-10-CM

## 2022-11-05 DIAGNOSIS — R748 Abnormal levels of other serum enzymes: Secondary | ICD-10-CM | POA: Diagnosis not present

## 2022-11-05 DIAGNOSIS — R7989 Other specified abnormal findings of blood chemistry: Secondary | ICD-10-CM

## 2022-11-05 NOTE — Progress Notes (Signed)
GI Office Note    Referring Provider: Celene Squibb, MD Primary Care Physician:  Celene Squibb, MD  Primary Gastroenterologist: Elon Alas. Abbey Chatters, DO   Chief Complaint   Chief Complaint  Patient presents with   Follow-up    Follow up on anemia    History of Present Illness   William Sandoval is a 75 y.o. male presenting today for follow-up.  Last seen in April 2023.  History of A-fib on Coumadin, anemia, weight loss, elevated alk phos.   December 2021, celiac serologies negative, B12 low at 73, iron 91, ferritin 685, folate 19.3, hemoglobin 12.6.  Repeat ferritin February 2022 was 546.  HFE genetics were negative.  He completed EGD and colonoscopy as outlined below.  B12 monthly injections. 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  August 2022, celiac serologies remain negative.  Alpha gal negative.  Due to elevated alkaline phosphatase, AMA was obtained which was negative.  Hepatitis B and C neg.  GGT normal.  H. pylori stool antigen negative.  Reviewed labs from Apr 11, 2022: Serum iron 72, iron saturation is 32%, TIBC 225, ferritin 370 normal, TSH 8.550, white blood cell count 8100, hemoglobin 15.1, platelets 204,000, glucose 93, creatinine 0.9, total bilirubin 0.5, alkaline phosphatase 199 elevated, AST 30, ALT 28, albumin 4.   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 AMA has been negative. GGT normal. RUQ U/S updated in 05/2022, with mildly increased echogenicity of the liver parenchyma which could be due to hepatic steatosis.    Today: Weight has been stable. Appetite ok. Eats enough to maintain weight. BM regular. No melena, brbpr. No heartburn.   His biggest complaints continue to be decade long issues with pruritus and rash. He has been seen by dermatology and allergy specialists several times and  voices frustrations with inconclusive biopsies, and conflicted testing. States he has had positive labs for allergies but negative intradermal testing. States he is allergic to perfume on skin testing. States labs previously showed positives towards gluten, shrimp, and eggs. He reports he avoids shellfish but consumes gluten and eggs. Going for more than a decade, itching, numerous dermatologists. Last skin biopsies by Dr. Denna Haggard in 08/2021 supporting diagnosis of eczema (atopic dermatitis). Patient reports he felt Dr. Denna Haggard was "getting somewhere" with providing him some relief but then he is no longer practicing. 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    Colonoscopy November 20, 2020: -Non-bleeding internal hemorrhoids. - Diverticulosis in the sigmoid colon and in the descending colon. - There was significant looping of the colon. - The examination was otherwise normal. - No specimens collected.   EGD November 20, 2020: - Z-line irregular, 40 cm from the incisors. Biopsied (reflux changes). - Normal stomach. - Normal duodenal bulb, first portion of the duodenum and second portion of the duodenum. Biopsied (increased intraepithelial lymphocytes but no significant villous blunting, possible gluten sensitivity versus peptic injury versus NSAID injury versus H. Pylori).     Medications   Current Outpatient Medications  Medication Sig Dispense Refill   acetaminophen (TYLENOL) 650 MG CR tablet Take 650-1,300 mg by mouth every 8 (eight) hours as needed for pain.     atorvastatin (LIPITOR) 40 MG tablet Take 40 mg by mouth daily.  camphor-menthol (SARNA) lotion Apply 1 application topically as needed for itching.     Cyanocobalamin (B-12 COMPLIANCE INJECTION IJ) Inject as directed.     levothyroxine (SYNTHROID) 112 MCG tablet Take 112 mcg by mouth daily before breakfast.     metoprolol succinate (TOPROL-XL) 50 MG 24 hr tablet  Take 50 mg by mouth daily.     Multiple Vitamin (MULTIVITAMIN WITH MINERALS) TABS tablet Take 1 tablet by mouth daily. VitaFusion Men's Multivitamin     warfarin (COUMADIN) 5 MG tablet Take 5-7.5 mg by mouth See admin instructions. Take 1.5 tablets (7.5 mg) on Mondays, Wednesdays, Fridays, & Saturdays then take 1 tablet (5 mg) by mouth on all other days.     No current facility-administered medications for this visit.    Allergies   Allergies as of 11/05/2022 - Review Complete 11/05/2022  Allergen Reaction Noted   Gluten meal Other (See Comments) 08/26/2015   Shrimp [shellfish allergy] Other (See Comments) 08/26/2015   Sertraline Rash 08/25/2015         Review of Systems   General: Negative for anorexia, weight loss, fever, chills, fatigue, weakness. ENT: Negative for hoarseness, difficulty swallowing , nasal congestion. CV: Negative for chest pain, angina, palpitations, dyspnea on exertion, peripheral edema.  Respiratory: Negative for dyspnea at rest, dyspnea on exertion, cough, sputum, wheezing.  GI: See history of present illness. GU:  Negative for dysuria, hematuria, urinary incontinence, urinary frequency, nocturnal urination.  Endo: Negative for unusual weight change.     Physical Exam   BP 134/79   Pulse (!) 59   Temp (!) 97.2 F (36.2 C)   Ht _0  (1.93 m)   Wt 212 lb 3.2 oz (96.3 kg)   BMI 25.83 kg/m    General: Well-nourished, well-developed in no acute distress.  Eyes: No icterus. Mouth: Oropharyngeal mucosa moist and pink , no lesions erythema or exudate. Abdomen: Bowel sounds are normal, nontender, nondistended, no hepatosplenomegaly or masses,  no abdominal bruits or hernia , no rebound or guarding.  Rectal: not performed Extremities: No lower extremity edema. No clubbing or deformities. Neuro: Alert and oriented x 4   Skin: Warm and dry, no jaundice.   Psych: Alert and cooperative, normal mood and affect.  Labs   Labs dated October 10, 2022: TSH  1.940, white blood cell count 7500, hemoglobin 14.3, platelets 2 55,000, glucose 93, creatinine 0.94, sodium 142, albumin 3.7, total bilirubin 0.5, alkaline phosphatase 171 (previously 199), AST 30, ALT 29, B12 419.  Imaging Studies   No results found.  Assessment   H/O Anemia: noted in 2021. H/O IDA, has resolved. Continues B12 injections for B12 deficiency. EGD/colonoscopy completed as outlined above. H/o of iron deficiency in the past. Ferritin has been markedly elevated. HFE genetics negative. Endoscopically the duodenum appeared normal but on biopsy there was increased intraepithelial lymphocytes which could be seen with gluten sensitivity versus peptic injury versus NSAID versus H. pylori. H. pylori stool antigen was negative. Celiac serologies negative. 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 Hemoglobin has been normal.     Elevated alk phos: AMA negative. GGT normal. U/S with possible fatty liver. Elevated AP stable, appears related to a source other than liver. It is not clear if elevated ferritin, alk phos, and chronic pruritus are linked.   Elevated ferritin: HFE genetics negative.    PLAN   Recheck labs in 12/2022, GGT, alkaline phosphatase isoenzymes, LFTs, ron/tibc/ferritin.  Laureen Ochs. Bobby Rumpf, [place], [facility]

## 2022-11-05 NOTE — Telephone Encounter (Signed)
Please let pt know that to further evaluate if his chronic itching is liver related or elevated alk phos is liver related, that we need some additional labs next month. I have placed orders.

## 2022-11-05 NOTE — Telephone Encounter (Signed)
Phoned and LMOVM for the pt to return call 

## 2022-11-05 NOTE — Patient Instructions (Signed)
I will look into your elevated alkaline phosphatase to see if I can find any links with your chronic itching/skin issues. Further recommendations to follow over the next couple of weeks.

## 2022-11-06 NOTE — Telephone Encounter (Signed)
FYI:  Pt came into the office before lunch and was given his instructions and lab forms to go to [facility] to have his blood drawn. Pt expressed understanding

## 2022-11-07 DIAGNOSIS — R748 Abnormal levels of other serum enzymes: Secondary | ICD-10-CM | POA: Diagnosis not present

## 2022-11-07 DIAGNOSIS — R7989 Other specified abnormal findings of blood chemistry: Secondary | ICD-10-CM | POA: Diagnosis not present

## 2022-11-07 DIAGNOSIS — L299 Pruritus, unspecified: Secondary | ICD-10-CM | POA: Diagnosis not present

## 2022-11-18 ENCOUNTER — Ambulatory Visit: Payer: Medicare HMO

## 2022-11-18 ENCOUNTER — Ambulatory Visit
Admission: EM | Admit: 2022-11-18 | Discharge: 2022-11-18 | Disposition: A | Discharge status: Home / Self Care | Payer: Medicare HMO | Attending: Nurse Practitioner | Admitting: Nurse Practitioner

## 2022-11-18 ENCOUNTER — Ambulatory Visit (INDEPENDENT_AMBULATORY_CARE_PROVIDER_SITE_OTHER): Payer: Medicare HMO

## 2022-11-18 DIAGNOSIS — M25572 Pain in left ankle and joints of left foot: Secondary | ICD-10-CM | POA: Diagnosis not present

## 2022-11-18 DIAGNOSIS — W11XXXA Fall on and from ladder, initial encounter: Secondary | ICD-10-CM | POA: Diagnosis not present

## 2022-11-18 DIAGNOSIS — S82832A Other fracture of upper and lower end of left fibula, initial encounter for closed fracture: Secondary | ICD-10-CM

## 2022-11-18 DIAGNOSIS — M7989 Other specified soft tissue disorders: Secondary | ICD-10-CM | POA: Diagnosis not present

## 2022-11-18 DIAGNOSIS — R600 Localized edema: Secondary | ICD-10-CM | POA: Diagnosis not present

## 2022-11-18 DIAGNOSIS — S92155A Nondisplaced avulsion fracture (chip fracture) of left talus, initial encounter for closed fracture: Secondary | ICD-10-CM

## 2022-11-18 NOTE — ED Provider Notes (Signed)
[facility]    CSN: [id number] Arrival date & time: 11/18/22  0920      History   Chief Complaint Chief Complaint  Patient presents with   Ankle Pain    HPI William Sandoval is a 76 y.o. male.   Patient presents today for left ankle pain that has been ongoing since he fell over a ladder working on his property on Saturday.  Reports the ankle has been bruised and swollen ever since and is moving up the leg.  He has pain when he walks or when he moves the ankle.  Has taken ibuprofen a couple of times which helps with pain temporarily.  He denies any numbness or tingling in his toes, fevers, or nausea/vomiting since the ankle pain began.  No abrasions or open skin around the ankle.  Medical history significant for history of atrial fibrillation, he is anticoagulated on warfarin.  He denies loss of consciousness or hitting his head when he fell.    Past Medical History:  Diagnosis Date   Anemia    Arthritis    shoulder,   Chronic atrial fibrillation ([place])    Dyslipidemia    Dysrhythmia    AFib   Epistaxis    Hypertension     Patient Active Problem List   Diagnosis Date Noted   Elevated alkaline phosphatase level 11/05/2022   Abnormal LFTs 09/26/2021   Pruritus 09/26/2021   Venous stasis dermatitis 08/01/2021   Anemia    Elevated ferritin    Mucosal abnormality of duodenum    Bowel habit changes 11/01/2020   Abnormal weight loss 11/01/2020   IDA (iron deficiency anemia) 11/01/2020   Dysphagia 11/01/2020   S/P reverse total shoulder arthroplasty, 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   Long term (current) use of anticoagulants 03/14/2011   HYPOTHYROIDISM 12/26/2009   HYPERLIPIDEMIA 12/26/2009   ATRIAL FIBRILLATION 12/26/2009   SHOULDER PAIN, LEFT 12/26/2009    Past Surgical History:  Procedure Laterality Date   ACOUSTIC NEUROMA RESECTION Left 1991   [facility]   BIOPSY  11/20/2020   Procedure: BIOPSY;  Surgeon: Eloise Harman, DO;  [facility]: [facility];  Service:  Endoscopy;;   CARPAL TUNNEL RELEASE Bilateral    COLONOSCOPY WITH PROPOFOL N/A 11/20/2020   Procedure: COLONOSCOPY WITH PROPOFOL;  Surgeon: Eloise Harman, DO;  Location: [facility];  Service: Endoscopy;  Laterality: N/A;  12:00pm   ESOPHAGOGASTRODUODENOSCOPY (EGD) WITH PROPOFOL N/A 11/20/2020   Procedure: ESOPHAGOGASTRODUODENOSCOPY (EGD) WITH PROPOFOL;  Surgeon: Eloise Harman, DO;  [facility]: [facility];  Service: Endoscopy;  Laterality: N/A;   REVERSE SHOULDER ARTHROPLASTY Left 09/16/2019   Procedure: REVERSE SHOULDER ARTHROPLASTY;  Surgeon: Justice Britain, MD;  Location: [facility];  Service: Orthopedics;  Laterality: Left;  139mn   SHOULDER SURGERY     Right       Home Medications    Prior to Admission medications   Medication Sig Start Date End Date Taking? Authorizing Provider  acetaminophen (TYLENOL) 650 MG CR tablet Take 650-1,300 mg by mouth every 8 (eight) hours as needed for pain.    [provider]  atorvastatin (LIPITOR) 40 MG tablet Take 40 mg by mouth daily.    [provider]  camphor-menthol (Timoteo Ace lotion Apply 1 application topically as needed for itching.    [provider]  Cyanocobalamin (B-12 COMPLIANCE INJECTION IJ) Inject as directed.    [provider]  levothyroxine (SYNTHROID) 112 MCG tablet Take 112 mcg by mouth daily before breakfast.    [provider]  metoprolol succinate (TOPROL-XL) 50 MG 24 hr tablet Take 50 mg by mouth daily. 09/27/20   [provider]  Multiple Vitamin (MULTIVITAMIN WITH MINERALS) TABS tablet Take 1 tablet by mouth daily. VitaFusion Men's Multivitamin    [provider]  warfarin (COUMADIN) 5 MG tablet Take 5-7.5 mg by mouth See admin instructions. Take 1.5 tablets (7.5 mg) on Mondays, Wednesdays, Fridays, & Saturdays then take 1 tablet (5 mg) by mouth on all other days.    [provider]    Family History Family History  Problem Relation Age of Onset    Heart attack Mother    Colon cancer Neg Hx     Social History Social History   Tobacco Use   Smoking status: Never   Smokeless tobacco: Never  Vaping Use   Vaping Use: Never used  Substance Use Topics   Alcohol use: No   Drug use: No     Allergies   Gluten meal, Shrimp [shellfish allergy], and Sertraline   Review of Systems Review of Systems Per HPI  Physical Exam Triage Vital Signs ED Triage Vitals [11/18/22 1218]  Enc Vitals Group     BP (!) 145/84     Pulse Rate 75     Resp 17     Temp (!) 97.5 F (36.4 C)     Temp src      SpO2 98 %     Weight      Height      Head Circumference      Peak Flow      Pain Score 10     Pain Loc      Pain Edu?      Excl. in [place]?    No data found.  Updated Vital Signs BP (!) 145/84   Pulse 75   Temp (!) 97.5 F (36.4 C)   Resp 17   SpO2 98%   Visual Acuity Right Eye Distance:   Left Eye Distance:   Bilateral Distance:    Right Eye Near:   Left Eye Near:    Bilateral Near:     Physical Exam Vitals and nursing note reviewed.  Constitutional:      General: He is not in acute distress.    Appearance: Normal appearance. He is not toxic-appearing.  Pulmonary:     Effort: Pulmonary effort is normal. No respiratory distress.  Musculoskeletal:     Right lower leg: Normal. No edema.     Left lower leg: Tenderness present. No deformity. 3+ Pitting Edema present.     Left ankle: Swelling and ecchymosis present. No deformity. Tenderness present. Decreased range of motion.     Left foot: Normal capillary refill. Swelling, tenderness and bony tenderness present. Normal pulse.       Legs:     Comments: Inspection: Swelling and bruising to the left lateral malleolus, bruising extends into the dorsal hindfoot; no obvious deformity or redness Palpation: Left lower extremity tender to palpation in area marked; no obvious deformities palpated ROM: Range of motion difficult to perform secondary to pain Strength: Difficult  to perform secondary to pain Neurovascular: neurovascularly intact in left lower extremity   Skin:    General: Skin is warm and dry.     Capillary Refill: Capillary refill takes less than 2 seconds.     Coloration: Skin is not jaundiced or pale.     Findings: No erythema.  Neurological:     Mental Status: He is alert and oriented  to person, place, and time.  Psychiatric:        Behavior: Behavior is cooperative.      UC Treatments / Results  Labs (all labs ordered are listed, but only abnormal results are displayed) Labs Reviewed - No data to display  EKG   Radiology DG Foot Complete Left  Result Date: 11/18/2022 CLINICAL DATA:  Status post fall with pain and swelling. Trip and fall over ladder 2 days ago. Laceration to big toe. EXAM: LEFT FOOT - COMPLETE 3+ VIEW COMPARISON:  None Available. FINDINGS: Possible but not definite discontinuity of the dorsal talus which may represent an avulsion type injury. No other fracture of the foot. The alignment is maintained. Slight degenerative change of the great toe interphalangeal joint and metatarsal-phalangeal joint. No soft tissue gas or radiopaque foreign body. Small Achilles tendon enthesophyte and plantar calcaneal spur. Mild dorsal soft tissue edema IMPRESSION: 1. Possible but not definite discontinuity of the dorsal talus which may represent an avulsion type injury. 2. Soft tissue edema without soft tissue gas or radiopaque foreign body. Electronically Signed   By: Keith Rake M.D.   On: 11/18/2022 12:42   DG Ankle Complete Left  Result Date: 11/18/2022 CLINICAL DATA:  Status post fall with pain and swelling. Trip and fall over ladder 2 days ago. Lateral ankle pain. EXAM: LEFT ANKLE COMPLETE - 3+ VIEW COMPARISON:  None Available. FINDINGS: Suspected nondisplaced fracture of the distal fibula appreciated only on the oblique view. This is distal to the ankle mortise. Possible but not definite discontinuity of the dorsal talus near  the capsular insertion. Ankle mortise is intact. Intact talar dome. Soft tissue edema is most prominent laterally. Possible small ankle joint effusion. IMPRESSION: 1. Suspected nondisplaced distal fibular fracture. Possible but not definite discontinuity of the dorsal talus near the capsular insertion which may represent an avulsion injury. 2. Soft tissue edema most prominent laterally. Possible small ankle joint effusion. Electronically Signed   By: Keith Rake M.D.   On: 11/18/2022 12:41    Procedures Procedures (including critical care time)  Medications Ordered in UC Medications - No data to display  Initial Impression / Assessment and Plan / UC Course  I have reviewed the triage vital signs and the nursing notes.  Pertinent labs & imaging results that were available during my care of the patient were reviewed by me and considered in my medical decision making (see chart for details).   Patient is well-appearing, normotensive, afebrile, not tachycardic, not tachypneic, oxygenating well on room air.    Other closed fracture of distal end of left fibula, initial encounter Closed nondisplaced avulsion fracture of left talus, initial encounter Patient placed in left posterior stirrup splint, instructed on nonweightbearing status and crutches provided Recommended close follow-up with orthopedic provider within 48 hours and contact information given Continue Tylenol 500 to 1000 mg every 6 hours as needed for pain, instructed to discontinue ibuprofen given use of Coumadin  The patient was given the opportunity to ask questions.  All questions answered to their satisfaction.  The patient is in agreement to this plan.    Final Clinical Impressions(s) / UC Diagnoses   Final diagnoses:  Other closed fracture of distal end of left fibula, initial encounter  Closed nondisplaced avulsion fracture of left talus, initial encounter     Discharge Instructions      The x-ray today shows you  have fractures in the bottom of your leg bone and the top of your foot.  We have  put a splint on your leg, please do not bear weight on your leg until you follow up with an Orthopedic provider.  Use the crutches we have provided.    Please follow up with an Orthopedic Provider later this week for further management.  Contact information is below.      ED Prescriptions   None    PDMP not reviewed this encounter.   Eulogio Bear, NP 11/18/22 1325

## 2022-11-18 NOTE — ED Triage Notes (Signed)
Pt presents with injury to his left ankle. Reports tripping over a ladder on Saturday. Pain is worse with movement and while ambulating.

## 2022-11-18 NOTE — Discharge Instructions (Addendum)
The x-ray today shows you have fractures in the bottom of your leg bone and the top of your foot.  We have put a splint on your leg, please do not bear weight on your leg until you follow up with an Orthopedic provider.  Use the crutches we have provided.    Please follow up with an Orthopedic Provider later this week for further management.  Contact information is below.

## 2022-11-19 ENCOUNTER — Encounter: Payer: Self-pay | Admitting: Orthopedic Surgery

## 2022-11-19 ENCOUNTER — Ambulatory Visit: Payer: Medicare HMO | Admitting: Orthopedic Surgery

## 2022-11-19 VITALS — Ht 76.0 in | Wt 212.0 lb

## 2022-11-19 DIAGNOSIS — S92155A Nondisplaced avulsion fracture (chip fracture) of left talus, initial encounter for closed fracture: Secondary | ICD-10-CM | POA: Diagnosis not present

## 2022-11-19 DIAGNOSIS — S82832A Other fracture of upper and lower end of left fibula, initial encounter for closed fracture: Secondary | ICD-10-CM | POA: Diagnosis not present

## 2022-11-19 NOTE — Patient Instructions (Signed)
Boot at all times when walking  Okay to remove the boot to work on range of motion  Okay to remove the boot for hygiene  Can start putting weight on the ankle while wearing the boot  Elevate the foot to help with swelling.  Tylenol ice and occasional ibuprofen for pain

## 2022-11-20 NOTE — Progress Notes (Signed)
New Patient Visit  Assessment: William Sandoval is a 76 y.o. male with the following: 1. Other closed fracture of distal end of left fibula, initial encounter 2. Closed nondisplaced avulsion fracture of left talus, initial encounter  Plan: Blanch Media sustained a nondisplaced fracture of the distal fibula, as well as an avulsion fracture of the talus of the left foot after rolling his ankle.  Reviewed radiographs with the patient in clinic today.  He does have some residual bruising.  Swelling is getting better.  His pain has been controlled.  Will transition him to a walking boot.  I provided him with exercises to initiate, starting with range of motion.  He states his understanding.  I will reevaluate him in 3 weeks.  Follow-up: Return in about 3 weeks (around 12/10/2022).  Subjective:  Chief Complaint  Patient presents with   Fracture    Lt lower leg/ankle DOI 11/16/22    History of Present Illness: William Sandoval is a 76 y.o. male who presents for evaluation of left ankle pain.  He states that he slipped and rolled his ankle a few days ago.  He had immediate pain, swelling and bruising.  He presented to the emergency department.  Radiographs demonstrated a nondisplaced fracture of the distal fibula, as well as an avulsion fracture of the left talus.  He was placed in a splint.  He has remained nonweightbearing.   Review of Systems: No fevers or chills No numbness or tingling No chest pain No shortness of breath No bowel or bladder dysfunction No GI distress No headaches   Medical History:  Past Medical History:  Diagnosis Date   Anemia    Arthritis    shoulder,   Chronic atrial fibrillation ([place])    Dyslipidemia    Dysrhythmia    AFib   Epistaxis    Hypertension     Past Surgical History:  Procedure Laterality Date   ACOUSTIC NEUROMA RESECTION Left 1991   [facility]   BIOPSY  11/20/2020   Procedure: BIOPSY;  Surgeon: Eloise Harman, DO;  [facility];   Service: Endoscopy;;   CARPAL TUNNEL RELEASE Bilateral    COLONOSCOPY WITH PROPOFOL N/A 11/20/2020   Procedure: COLONOSCOPY WITH PROPOFOL;  Surgeon: Eloise Harman, DO;  [facility];  Service: Endoscopy;  Laterality: N/A;  12:00pm   ESOPHAGOGASTRODUODENOSCOPY (EGD) WITH PROPOFOL N/A 11/20/2020   Procedure: ESOPHAGOGASTRODUODENOSCOPY (EGD) WITH PROPOFOL;  Surgeon: Eloise Harman, DO;  [facility];  Service: Endoscopy;  Laterality: N/A;   REVERSE SHOULDER ARTHROPLASTY Left 09/16/2019   Procedure: REVERSE SHOULDER ARTHROPLASTY;  Surgeon: Justice Britain, MD;  Location: [facility];  Service: Orthopedics;  Laterality: Left;  172mn   SHOULDER SURGERY     Right    Family History  Problem Relation Age of Onset   Heart attack Mother    Colon cancer Neg Hx    Social History   Tobacco Use   Smoking status: Never   Smokeless tobacco: Never  Vaping Use   Vaping Use: Never used  Substance Use Topics   Alcohol use: No   Drug use: No    Allergies  Allergen Reactions   Gluten Meal Other (See Comments)    Unknown    Shrimp [Shellfish Allergy] Other (See Comments)    Unknown    Sertraline Rash    No outpatient medications have been marked as taking for the 11/19/22 encounter (Office Visit) with CMordecai Rasmussen MD.  Objective: Ht '6\' 4"'$  (1.93 m)   Wt 212 lb (96.2 kg)   BMI 25.81 kg/m   Physical Exam:  General: Alert and oriented. and No acute distress. Gait: Ambulates with the assistance of a cane.  Left foot and ankle with bruising and swelling, diffusely.  Tenderness palpation of the lateral foot.  He tolerates gentle plantarflexion and dorsiflexion.  Toes warm well-perfused.  Sensation intact over the dorsum of the foot.      IMAGING: I personally reviewed images previously obtained from the ED   Nondisplaced fracture of the left distal fibula.  Possible avulsion fracture of the talus.   New Medications:  No orders of the defined types were  placed in this encounter.     [name], MD  11/20/2022 11:51 AM

## 2022-12-03 DIAGNOSIS — S82832A Other fracture of upper and lower end of left fibula, initial encounter for closed fracture: Secondary | ICD-10-CM | POA: Diagnosis not present

## 2022-12-03 DIAGNOSIS — D6869 Other thrombophilia: Secondary | ICD-10-CM | POA: Diagnosis not present

## 2022-12-03 DIAGNOSIS — I482 Chronic atrial fibrillation, unspecified: Secondary | ICD-10-CM | POA: Diagnosis not present

## 2022-12-03 DIAGNOSIS — E538 Deficiency of other specified B group vitamins: Secondary | ICD-10-CM | POA: Diagnosis not present

## 2022-12-03 DIAGNOSIS — S92102A Unspecified fracture of left talus, initial encounter for closed fracture: Secondary | ICD-10-CM | POA: Diagnosis not present

## 2022-12-03 DIAGNOSIS — X58XXXA Exposure to other specified factors, initial encounter: Secondary | ICD-10-CM | POA: Diagnosis not present

## 2022-12-03 DIAGNOSIS — I10 Essential (primary) hypertension: Secondary | ICD-10-CM | POA: Diagnosis not present

## 2022-12-03 DIAGNOSIS — D509 Iron deficiency anemia, unspecified: Secondary | ICD-10-CM | POA: Diagnosis not present

## 2022-12-03 DIAGNOSIS — R945 Abnormal results of liver function studies: Secondary | ICD-10-CM | POA: Diagnosis not present

## 2022-12-10 ENCOUNTER — Ambulatory Visit (INDEPENDENT_AMBULATORY_CARE_PROVIDER_SITE_OTHER): Payer: Medicare HMO | Admitting: Orthopedic Surgery

## 2022-12-10 ENCOUNTER — Encounter: Payer: Self-pay | Admitting: Orthopedic Surgery

## 2022-12-10 ENCOUNTER — Ambulatory Visit (INDEPENDENT_AMBULATORY_CARE_PROVIDER_SITE_OTHER): Payer: Medicare HMO

## 2022-12-10 DIAGNOSIS — S82832A Other fracture of upper and lower end of left fibula, initial encounter for closed fracture: Secondary | ICD-10-CM

## 2022-12-10 DIAGNOSIS — S92155A Nondisplaced avulsion fracture (chip fracture) of left talus, initial encounter for closed fracture: Secondary | ICD-10-CM

## 2022-12-10 NOTE — Patient Instructions (Signed)
Use the boot while walking for the next 2-3 weeks.  Okay to remove the boot to work on range of motion.  In 3 weeks, he can start removing the boot, and transition to a regular shoe.  Follow-up in 4 weeks.

## 2022-12-10 NOTE — Progress Notes (Signed)
Return patient Visit  Assessment: William Sandoval is a 77 y.o. male with the following: 1. Other closed fracture of distal end of left fibula, subsequent encounter 2. Closed nondisplaced avulsion fracture of left talus, subsequent encounter  Plan: William Sandoval rolled his ankle, approximately 3 weeks ago.  He has a healing distal fibula fracture, with an avulsion fracture of the lateral aspect of the talus.  He has remained in a walking boot.  He states that is getting better.  Continue to use the walking boot for another 2-3 weeks.  Okay to remove for hygiene, and initiate range of motion.  In 3 weeks, he can return remove the boot, and transition to a regular shoe.  I will see him in a month.  Follow-up: Return in about 4 weeks (around 01/07/2023).  Subjective:  Chief Complaint  Patient presents with   Ankle Pain    LEFT /fracture 11/16/22    History of Present Illness: William Sandoval is a 77 y.o. male who returns for evaluation of left ankle pain.  He rolled his ankle, to distal fibula fracture, as well as an avulsion fracture of the talus.  He has done well.  He has remained in his boot.  He is ambulating better with the boot on.  He states pain is improving.  His swelling is getting better.  He is not taking anything consistently for pain.  Review of Systems: No fevers or chills No numbness or tingling No chest pain No shortness of breath No bowel or bladder dysfunction No GI distress No headaches    Objective: There were no vitals taken for this visit.  Physical Exam:  General: Alert and oriented. and No acute distress. Gait: Ambulating well in a walking boot.  Evaluation of the left foot and ankle demonstrates some mild bruising, as well as swelling in the dorsum of his foot.  Sensation is intact over the dorsum.  Mild tenderness palpation laterally.  No tenderness palpation medially.  Active dorsiflexion of the ankle and the great toe.  Toes warm and  well-perfused.    IMAGING: I personally ordered and reviewed the following images  X-rays left ankle were obtained in clinic today.  Minimally displaced distal fibula fracture in stable alignment.  There is been no interval displacement.  Mortise is congruent.  Avulsion fracture of the talus is not apparent.  No bony lesions.  Impression: left distal fibula fracture in stable alignment.    New Medications:  No orders of the defined types were placed in this encounter.     [name], MD  12/10/2022 11:13 AM

## 2022-12-24 DIAGNOSIS — D509 Iron deficiency anemia, unspecified: Secondary | ICD-10-CM | POA: Diagnosis not present

## 2022-12-24 DIAGNOSIS — E538 Deficiency of other specified B group vitamins: Secondary | ICD-10-CM | POA: Diagnosis not present

## 2022-12-24 DIAGNOSIS — R945 Abnormal results of liver function studies: Secondary | ICD-10-CM | POA: Diagnosis not present

## 2022-12-24 DIAGNOSIS — I10 Essential (primary) hypertension: Secondary | ICD-10-CM | POA: Diagnosis not present

## 2022-12-24 DIAGNOSIS — D6869 Other thrombophilia: Secondary | ICD-10-CM | POA: Diagnosis not present

## 2022-12-24 DIAGNOSIS — S92102D Unspecified fracture of left talus, subsequent encounter for fracture with routine healing: Secondary | ICD-10-CM | POA: Diagnosis not present

## 2022-12-24 DIAGNOSIS — W11XXXD Fall on and from ladder, subsequent encounter: Secondary | ICD-10-CM | POA: Diagnosis not present

## 2022-12-24 DIAGNOSIS — I482 Chronic atrial fibrillation, unspecified: Secondary | ICD-10-CM | POA: Diagnosis not present

## 2022-12-24 DIAGNOSIS — D6859 Other primary thrombophilia: Secondary | ICD-10-CM | POA: Diagnosis not present

## 2023-01-06 ENCOUNTER — Encounter: Payer: Self-pay | Admitting: Orthopedic Surgery

## 2023-01-06 ENCOUNTER — Ambulatory Visit (INDEPENDENT_AMBULATORY_CARE_PROVIDER_SITE_OTHER): Payer: Medicare HMO

## 2023-01-06 ENCOUNTER — Ambulatory Visit (INDEPENDENT_AMBULATORY_CARE_PROVIDER_SITE_OTHER): Payer: Medicare HMO | Admitting: Orthopedic Surgery

## 2023-01-06 DIAGNOSIS — S82832D Other fracture of upper and lower end of left fibula, subsequent encounter for closed fracture with routine healing: Secondary | ICD-10-CM | POA: Diagnosis not present

## 2023-01-06 DIAGNOSIS — S92155D Nondisplaced avulsion fracture (chip fracture) of left talus, subsequent encounter for fracture with routine healing: Secondary | ICD-10-CM

## 2023-01-06 NOTE — Progress Notes (Signed)
Return patient Visit  Assessment: William Sandoval is a 77 y.o. male with the following: 1. Other closed fracture of distal end of left fibula, subsequent encounter 2. Closed nondisplaced avulsion fracture of left talus, subsequent encounter  Plan: [name] rolled his ankle, greater than 6 weeks ago.  He has done well.  He has started to wear regular shoe, and notes no pain.  Radiographs today demonstrate healing.  He can transition full-time to regular shoe, gradually increase the level of his activities.  Medications as needed.  He will follow-up as needed.  Follow-up: Return if symptoms worsen or fail to improve.  Subjective:  Chief Complaint  Patient presents with   Ankle Injury    Fx left fibula/follow up DOI 11/16/2022    History of Present Illness: William Sandoval is a 77 y.o. male who returns for evaluation of left ankle pain.  He sustained an injury to his left ankle greater than 6 weeks ago.  He has been immobilized in a boot.  He has done well.  He started walking without his walking boot.  He has been wearing a regular shoe.  He states he walks without foot wear in the house.  He denies pain.  Occasionally, he takes an ibuprofen for other pains.    Review of Systems: No fevers or chills No numbness or tingling No chest pain No shortness of breath No bowel or bladder dysfunction No GI distress No headaches    Objective: There were no vitals taken for this visit.  Physical Exam:  General: Alert and oriented. and No acute distress. Gait: Ambulating well in a walking boot.  Left foot without swelling.  No bruising.  No tenderness to palpation over the distal fibula.  Sensation intact over the dorsum of the foot.  Easily gets to a plantigrade position.   IMAGING: I personally ordered and reviewed the following images  X-rays left ankle were obtained in clinic today.  These were compared to prior x-rays.  No acute injuries are noted.  Distal fibula fracture  is appreciated, but is in an unchanged position.  There has been interval consolidation.  The mortise is congruent.  No syndesmotic disruption.  Avulsion fracture of the talus is not well-visualized.  Impression: Healed left distal fibula fracture    New Medications:  No orders of the defined types were placed in this encounter.     [name], MD  01/06/2023 1:54 PM

## 2023-01-06 NOTE — Patient Instructions (Signed)
Transition to a regular shoe  Follow up as needed

## 2023-01-07 ENCOUNTER — Encounter: Payer: Medicare HMO | Admitting: Orthopedic Surgery

## 2023-01-07 DIAGNOSIS — D6869 Other thrombophilia: Secondary | ICD-10-CM | POA: Diagnosis not present

## 2023-01-07 DIAGNOSIS — D509 Iron deficiency anemia, unspecified: Secondary | ICD-10-CM | POA: Diagnosis not present

## 2023-01-07 DIAGNOSIS — I10 Essential (primary) hypertension: Secondary | ICD-10-CM | POA: Diagnosis not present

## 2023-01-07 DIAGNOSIS — E782 Mixed hyperlipidemia: Secondary | ICD-10-CM | POA: Diagnosis not present

## 2023-01-07 DIAGNOSIS — S82832D Other fracture of upper and lower end of left fibula, subsequent encounter for closed fracture with routine healing: Secondary | ICD-10-CM | POA: Diagnosis not present

## 2023-01-07 DIAGNOSIS — R945 Abnormal results of liver function studies: Secondary | ICD-10-CM | POA: Diagnosis not present

## 2023-01-07 DIAGNOSIS — E538 Deficiency of other specified B group vitamins: Secondary | ICD-10-CM | POA: Diagnosis not present

## 2023-01-07 DIAGNOSIS — I482 Chronic atrial fibrillation, unspecified: Secondary | ICD-10-CM | POA: Diagnosis not present

## 2023-01-07 DIAGNOSIS — S92155D Nondisplaced avulsion fracture (chip fracture) of left talus, subsequent encounter for fracture with routine healing: Secondary | ICD-10-CM | POA: Diagnosis not present

## 2023-01-21 DIAGNOSIS — L209 Atopic dermatitis, unspecified: Secondary | ICD-10-CM | POA: Diagnosis not present

## 2023-01-21 DIAGNOSIS — I482 Chronic atrial fibrillation, unspecified: Secondary | ICD-10-CM | POA: Diagnosis not present

## 2023-01-21 DIAGNOSIS — D6869 Other thrombophilia: Secondary | ICD-10-CM | POA: Diagnosis not present

## 2023-01-21 DIAGNOSIS — I10 Essential (primary) hypertension: Secondary | ICD-10-CM | POA: Diagnosis not present

## 2023-02-04 DIAGNOSIS — I4891 Unspecified atrial fibrillation: Secondary | ICD-10-CM | POA: Diagnosis not present

## 2023-02-04 DIAGNOSIS — E538 Deficiency of other specified B group vitamins: Secondary | ICD-10-CM | POA: Diagnosis not present

## 2023-02-04 DIAGNOSIS — I10 Essential (primary) hypertension: Secondary | ICD-10-CM | POA: Diagnosis not present

## 2023-02-24 DIAGNOSIS — H5203 Hypermetropia, bilateral: Secondary | ICD-10-CM | POA: Diagnosis not present

## 2023-03-07 DIAGNOSIS — Z713 Dietary counseling and surveillance: Secondary | ICD-10-CM | POA: Diagnosis not present

## 2023-03-07 DIAGNOSIS — I480 Paroxysmal atrial fibrillation: Secondary | ICD-10-CM | POA: Diagnosis not present

## 2023-03-07 DIAGNOSIS — M79605 Pain in left leg: Secondary | ICD-10-CM | POA: Diagnosis not present

## 2023-03-07 DIAGNOSIS — I10 Essential (primary) hypertension: Secondary | ICD-10-CM | POA: Diagnosis not present

## 2023-03-07 DIAGNOSIS — E782 Mixed hyperlipidemia: Secondary | ICD-10-CM | POA: Diagnosis not present

## 2023-03-07 DIAGNOSIS — Z6825 Body mass index (BMI) 25.0-25.9, adult: Secondary | ICD-10-CM | POA: Diagnosis not present

## 2023-03-07 DIAGNOSIS — D6869 Other thrombophilia: Secondary | ICD-10-CM | POA: Diagnosis not present

## 2023-03-07 DIAGNOSIS — Z7901 Long term (current) use of anticoagulants: Secondary | ICD-10-CM | POA: Diagnosis not present

## 2023-03-07 DIAGNOSIS — E538 Deficiency of other specified B group vitamins: Secondary | ICD-10-CM | POA: Diagnosis not present

## 2023-03-12 ENCOUNTER — Ambulatory Visit
Admission: EM | Admit: 2023-03-12 | Discharge: 2023-03-12 | Disposition: A | Discharge status: Home / Self Care | Payer: Medicare HMO | Attending: Family Medicine | Admitting: Family Medicine

## 2023-03-12 ENCOUNTER — Ambulatory Visit (HOSPITAL_COMMUNITY)
Admission: RE | Admit: 2023-03-12 | Discharge: 2023-03-12 | Disposition: A | Discharge status: Home / Self Care | Payer: Medicare HMO | Source: Ambulatory Visit | Attending: Family Medicine | Admitting: Family Medicine

## 2023-03-12 DIAGNOSIS — M79662 Pain in left lower leg: Secondary | ICD-10-CM | POA: Insufficient documentation

## 2023-03-12 DIAGNOSIS — M7989 Other specified soft tissue disorders: Secondary | ICD-10-CM | POA: Diagnosis not present

## 2023-03-12 NOTE — ED Notes (Signed)
Spoke with centralized scheduling and reports pt can present to [facility] at 1300 today for [place]. Pt and MD aware.

## 2023-03-12 NOTE — ED Triage Notes (Signed)
Pt reports he has a red inflamed area on his left lower leg below his calf 2 weeks. Pt says the area hurts and occasionally is causing him to have sharp pain that makes him jump. Took tylenol 500 but no relief.

## 2023-03-13 NOTE — ED Provider Notes (Signed)
[facility]   [id number] 03/12/23 Arrival Time: 7741  ASSESSMENT & PLAN:  1. Pain and swelling of left lower leg    Unclear etiology. Non-pitting edema.  U/S negative for DVT. Recommend f/u with PCP.  Reviewed expectations re: course of current medical issues. Questions answered. Outlined signs and symptoms indicating need for more acute intervention. Patient verbalized understanding. After Visit Summary given.   SUBJECTIVE:  History from: patient. William Sandoval is a 77 y.o. male who reports that he has a red inflamed area on his left lower leg below his calf 2 weeks. Pt says the area hurts and occasionally is causing him to have sharp pain that makes him jump. Took tylenol 500 but no relief. LE swelling past day or so. Denies CP/SOB. H/O chronic A. Fib. On warfarin.  Social History   Tobacco Use  Smoking Status Never  Smokeless Tobacco Never   Social History   Substance and Sexual Activity  Alcohol Use No   OBJECTIVE:  Vitals:   03/12/23 1017  BP: 130/81  Pulse: 61  Resp: 20  Temp: 97.6 F (36.4 C)  TempSrc: Oral  SpO2: 97%    General appearance: alert, oriented, no acute distress Eyes: PERRLA; EOMI; conjunctivae normal HENT: normocephalic; atraumatic Neck: supple with FROM Lungs: without labored respirations; speaks full sentences without difficulty; CTAB CV: irreg irreg Extremities: with mod non-pitting edema of LE on the LEFT; calf is slightly firm; excoriated area over LE on the LEFT just above ankle; no signs of infection but area is very tender Skin: warm and dry; without rash or lesions Neuro: normal gait Psychological: alert and cooperative; normal mood and affect   Imaging: US Venous Img Lower Unilateral Left (DVT)  Result Date: 03/12/2023 CLINICAL DATA:  left lower leg pain and swelling EXAM: LEFT LOWER EXTREMITY VENOUS DOPPLER ULTRASOUND TECHNIQUE: Gray-scale sonography with compression, as well as color and duplex ultrasound, were  performed to evaluate the deep venous system(s) from the level of the common femoral vein through the popliteal and proximal calf veins. COMPARISON:  LEFT lower extremity XRs, 01/06/2023. FINDINGS: VENOUS Normal compressibility of the common femoral, superficial femoral, and popliteal veins, as well as the visualized calf veins. Visualized portions of profunda femoral vein and great saphenous vein unremarkable. No filling defects to suggest DVT on grayscale or color Doppler imaging. Doppler waveforms show normal direction of venous flow, normal respiratory plasticity and response to augmentation. Limited views of the contralateral common femoral vein are unremarkable. OTHER No evidence of superficial thrombophlebitis or abnormal fluid collection. Limitations: none IMPRESSION: No evidence of femoropopliteal DVT or superficial thrombophlebitis within the LEFT lower extremity. Roanna Banning, MD [facility] Radiology Electronically Signed   By: Roanna Banning M.D.   On: 03/12/2023 13:13     Allergies  Allergen Reactions   Gluten Meal Other (See Comments)    Unknown    Shrimp [Shellfish Allergy] Other (See Comments)    Unknown    Sertraline Rash    Past Medical History:  Diagnosis Date   Anemia    Arthritis    shoulder,   Chronic atrial fibrillation    Dyslipidemia    Dysrhythmia    AFib   Epistaxis    Hypertension    Social History   Socioeconomic History   Marital status: Single    Spouse name: Not on file   Number of children: Not on file   Years of education: Not on file   Highest education level:  Not on file  Occupational History   Occupation: Loading dock, factory work    Associate Professor: UNEMPLOYED  Tobacco Use   Smoking status: Never   Smokeless tobacco: Never  Vaping Use   Vaping Use: Never used  Substance and Sexual Activity   Alcohol use: No   Drug use: No   Sexual activity: Not Currently  Other Topics Concern   Not on file   Social History Narrative   Not on file   Social Determinants of Health   Financial Resource Strain: High Risk (09/27/2019)   Overall Financial Resource Strain (CARDIA)    Difficulty of Paying Living Expenses: Very hard  Food Insecurity: No Food Insecurity (03/04/2022)   Hunger Vital Sign    Worried About Running Out of Food in the Last Year: Never true    Ran Out of Food in the Last Year: Never true  Transportation Needs: No Transportation Needs (03/04/2022)   PRAPARE - Administrator, Civil Service (Medical): No    Lack of Transportation (Non-Medical): No  Physical Activity: Not on file  Stress: Not on file  Social Connections: Not on file  Intimate Partner Violence: Not on file   Family History  Problem Relation Age of Onset   Heart attack Mother    Colon cancer Neg Hx    Past Surgical History:  Procedure Laterality Date   ACOUSTIC NEUROMA RESECTION Left 1991   [facility]   BIOPSY  11/20/2020   Procedure: BIOPSY;  Surgeon: Lanelle Bal, DO;  Location: [facility];  Service: Endoscopy;;   CARPAL TUNNEL RELEASE Bilateral    COLONOSCOPY WITH PROPOFOL N/A 11/20/2020   Procedure: COLONOSCOPY WITH PROPOFOL;  Surgeon: Lanelle Bal, DO;  Location: [facility];  Service: Endoscopy;  Laterality: N/A;  12:00pm   ESOPHAGOGASTRODUODENOSCOPY (EGD) WITH PROPOFOL N/A 11/20/2020   Procedure: ESOPHAGOGASTRODUODENOSCOPY (EGD) WITH PROPOFOL;  Surgeon: Lanelle Bal, DO;  Location: [facility];  Service: Endoscopy;  Laterality: N/A;   REVERSE SHOULDER ARTHROPLASTY Left 09/16/2019   Procedure: REVERSE SHOULDER ARTHROPLASTY;  Surgeon: Francena Hanly, MD;  Location: [facility];  Service: Orthopedics;  Laterality: Left;    SHOULDER SURGERY     Right      Mardella Layman, MD 03/13/23 [phone]

## 2023-03-14 DIAGNOSIS — L03116 Cellulitis of left lower limb: Secondary | ICD-10-CM | POA: Diagnosis not present

## 2023-03-14 DIAGNOSIS — D6859 Other primary thrombophilia: Secondary | ICD-10-CM | POA: Diagnosis not present

## 2023-03-14 DIAGNOSIS — L209 Atopic dermatitis, unspecified: Secondary | ICD-10-CM | POA: Diagnosis not present

## 2023-03-14 DIAGNOSIS — Z7901 Long term (current) use of anticoagulants: Secondary | ICD-10-CM | POA: Diagnosis not present

## 2023-03-14 DIAGNOSIS — Z713 Dietary counseling and surveillance: Secondary | ICD-10-CM | POA: Diagnosis not present

## 2023-03-14 DIAGNOSIS — I4891 Unspecified atrial fibrillation: Secondary | ICD-10-CM | POA: Diagnosis not present

## 2023-03-14 DIAGNOSIS — D6869 Other thrombophilia: Secondary | ICD-10-CM | POA: Diagnosis not present

## 2023-03-14 DIAGNOSIS — Z6825 Body mass index (BMI) 25.0-25.9, adult: Secondary | ICD-10-CM | POA: Diagnosis not present

## 2023-03-14 DIAGNOSIS — I10 Essential (primary) hypertension: Secondary | ICD-10-CM | POA: Diagnosis not present

## 2023-03-19 ENCOUNTER — Emergency Department (HOSPITAL_COMMUNITY): Payer: Medicare HMO

## 2023-03-19 ENCOUNTER — Encounter (HOSPITAL_COMMUNITY): Payer: Self-pay | Admitting: *Deleted

## 2023-03-19 ENCOUNTER — Emergency Department (HOSPITAL_COMMUNITY)
Admission: EM | Admit: 2023-03-19 | Discharge: 2023-03-19 | Disposition: A | Discharge status: Home / Self Care | Payer: Medicare HMO | Attending: Emergency Medicine | Admitting: Emergency Medicine

## 2023-03-19 ENCOUNTER — Other Ambulatory Visit: Payer: Self-pay

## 2023-03-19 DIAGNOSIS — M7989 Other specified soft tissue disorders: Secondary | ICD-10-CM | POA: Diagnosis not present

## 2023-03-19 DIAGNOSIS — Z79899 Other long term (current) drug therapy: Secondary | ICD-10-CM | POA: Insufficient documentation

## 2023-03-19 DIAGNOSIS — I4891 Unspecified atrial fibrillation: Secondary | ICD-10-CM | POA: Diagnosis not present

## 2023-03-19 DIAGNOSIS — R600 Localized edema: Secondary | ICD-10-CM | POA: Diagnosis not present

## 2023-03-19 DIAGNOSIS — L539 Erythematous condition, unspecified: Secondary | ICD-10-CM | POA: Diagnosis not present

## 2023-03-19 DIAGNOSIS — L03116 Cellulitis of left lower limb: Secondary | ICD-10-CM | POA: Diagnosis not present

## 2023-03-19 DIAGNOSIS — Z7901 Long term (current) use of anticoagulants: Secondary | ICD-10-CM | POA: Insufficient documentation

## 2023-03-19 DIAGNOSIS — M79605 Pain in left leg: Secondary | ICD-10-CM | POA: Diagnosis not present

## 2023-03-19 DIAGNOSIS — I10 Essential (primary) hypertension: Secondary | ICD-10-CM | POA: Diagnosis not present

## 2023-03-19 LAB — CBC WITH DIFFERENTIAL/PLATELET
Abs Immature Granulocytes: 0.02 10*3/uL (ref 0.00–0.07)
Basophils Absolute: 0.1 10*3/uL (ref 0.0–0.1)
Basophils Relative: 1 %
Eosinophils Absolute: 0.5 10*3/uL (ref 0.0–0.5)
Eosinophils Relative: 6 %
HCT: 42.5 % (ref 39.0–52.0)
Hemoglobin: 13.9 g/dL (ref 13.0–17.0)
Immature Granulocytes: 0 %
Lymphocytes Relative: 13 %
Lymphs Abs: 1.1 10*3/uL (ref 0.7–4.0)
MCH: 29.9 pg (ref 26.0–34.0)
MCHC: 32.7 g/dL (ref 30.0–36.0)
MCV: 91.4 fL (ref 80.0–100.0)
Monocytes Absolute: 0.8 10*3/uL (ref 0.1–1.0)
Monocytes Relative: 10 %
Neutro Abs: 6 10*3/uL (ref 1.7–7.7)
Neutrophils Relative %: 70 %
Platelets: 266 10*3/uL (ref 150–400)
RBC: 4.65 MIL/uL (ref 4.22–5.81)
RDW: 13 % (ref 11.5–15.5)
WBC: 8.4 10*3/uL (ref 4.0–10.5)
nRBC: 0 % (ref 0.0–0.2)

## 2023-03-19 LAB — BASIC METABOLIC PANEL
Anion gap: 7 (ref 5–15)
BUN: 18 mg/dL (ref 8–23)
CO2: 26 mmol/L (ref 22–32)
Calcium: 9.1 mg/dL (ref 8.9–10.3)
Chloride: 104 mmol/L (ref 98–111)
Creatinine, Ser: 0.85 mg/dL (ref 0.61–1.24)
GFR, Estimated: >60 mL/min (ref >60)
Glucose, Bld: 95 mg/dL (ref 70–99)
Potassium: 3.6 mmol/L (ref 3.5–5.1)
Sodium: 137 mmol/L (ref 135–145)

## 2023-03-19 MED ORDER — AMOXICILLIN-POT CLAVULANATE 875-125 MG PO TABS
1.0000 | ORAL_TABLET | Freq: Once | ORAL | Status: AC
  Administered 2023-03-19: 1 via ORAL
  Filled 2023-03-19: qty 1

## 2023-03-19 MED ORDER — AMOXICILLIN-POT CLAVULANATE 875-125 MG PO TABS: 1.0000 | ORAL_TABLET | Freq: Two times a day (BID) | ORAL | 0 refills | Status: AC

## 2023-03-19 NOTE — Discharge Instructions (Signed)
Stop the cephalexin and start the Augmentin.  Take as directed.  Elevate your leg when possible.  Please follow-up with Dr. Scharlene Gloss office later this week for recheck.  Return to the emergency department for any new or worsening symptoms.

## 2023-03-19 NOTE — ED Triage Notes (Signed)
Pt in c/o L leg pain, swelling, and redness onset x 2 wks, pt denies injury with onset of symptoms, A&O x4

## 2023-03-19 NOTE — ED Provider Notes (Signed)
[place] EMERGENCY DEPARTMENT AT [facility] Provider Note   CSN: [id number] Arrival date & time: 03/19/23  1210     History  Chief Complaint  Patient presents with   Leg Pain    William Sandoval is a 77 y.o. male.   Leg Pain Associated symptoms: no back pain and no fever        William Sandoval is a 77 y.o. male past medical history of atrial fibrillation, anticoagulated on warfarin, anemia and hypertension who presents to the Emergency Department complaining of persistent redness pain and swelling of the left lower leg.  Notes having a small "bump" to the medial aspect of his lower leg near the ankle he scratched the area and states he had a very small amount of pus since then has been draining clear fluid.  He was seen at urgent care on 03/12/2023 for similar symptoms.  Had ultrasound DVT study that was negative.  He has been taking cephalexin without improvement.  He denies fever, chills, pain radiating above the knee increased pain or increased swelling of the legs since his ultrasound.  Home Medications Prior to Admission medications   Medication Sig Start Date End Date Taking? Authorizing Provider  acetaminophen (TYLENOL) 650 MG CR tablet Take 650-1,300 mg by mouth every 8 (eight) hours as needed for pain.    [provider]  atorvastatin (LIPITOR) 40 MG tablet Take 40 mg by mouth daily.    [provider]  camphor-menthol Wynelle Fanny) lotion Apply 1 application topically as needed for itching.    [provider]  Cyanocobalamin (B-12 COMPLIANCE INJECTION IJ) Inject as directed.    [provider]  levothyroxine (SYNTHROID) 112 MCG tablet Take 112 mcg by mouth daily before breakfast.    [provider]  metoprolol succinate (TOPROL-XL) 50 MG 24 hr tablet Take 50 mg by mouth daily. 09/27/20   [provider]  Multiple Vitamin (MULTIVITAMIN WITH MINERALS) TABS tablet Take 1 tablet by mouth daily. VitaFusion Men's  Multivitamin    [provider]  warfarin (COUMADIN) 5 MG tablet Take 5-7.5 mg by mouth See admin instructions. Take 1.5 tablets (7.5 mg) on Mondays, Wednesdays, Fridays, & Saturdays then take 1 tablet (5 mg) by mouth on all other days.    [provider]      Allergies    Gluten meal, Shrimp [shellfish allergy], and Sertraline    Review of Systems   Review of Systems  Constitutional:  Negative for appetite change, chills and fever.  Respiratory:  Negative for shortness of breath.   Cardiovascular:  Negative for chest pain.  Gastrointestinal:  Negative for abdominal pain, nausea and vomiting.  Genitourinary:  Negative for dysuria.  Musculoskeletal:  Positive for myalgias (Left lower leg pain redness and swelling). Negative for back pain.  Skin:  Positive for rash. Negative for color change.  Neurological:  Negative for weakness and numbness.    Physical Exam Updated Vital Signs BP (!) 169/77 (BP Location: Right Arm)   Pulse 60   Temp 97.7 F (36.5 C) (Oral)   Resp 18   Ht  (1.88 m)   Wt 93 kg   SpO2 97%   BMI 26.32 kg/m  Physical Exam Vitals and nursing note reviewed.  Constitutional:      General: He is not in acute distress.    Appearance: Normal appearance. He is not ill-appearing or toxic-appearing.  Cardiovascular:     Rate and Rhythm: Normal rate and regular rhythm.  Pulses: Normal pulses.     Comments: DP and PT pulses heard bilaterally with doppler. Pulmonary:     Effort: Pulmonary effort is normal. No respiratory distress.  Abdominal:     Palpations: Abdomen is soft.     Tenderness: There is no abdominal tenderness.  Musculoskeletal:        General: Swelling and tenderness present.     Comments: Multiple excoriated areas to the bilateral lower legs.  Small erythematous papule to the medial aspect of the distal left lower leg.  No lymphangitis noted.  Area is tender to palpation.  Posterior calf is nontender.   Skin:    General: Skin  is warm.     Capillary Refill: Capillary refill takes less than 2 seconds.     Findings: Erythema present.  Neurological:     General: No focal deficit present.     Mental Status: He is alert.     Sensory: No sensory deficit.     Motor: No weakness.     Patient gave verbal consent for image to be stored in medical record  ED Results / Procedures / Treatments   Labs (all labs ordered are listed, but only abnormal results are displayed) Labs Reviewed  CBC WITH DIFFERENTIAL/PLATELET  BASIC METABOLIC PANEL    EKG None  Radiology No results found.  Procedures Procedures    Medications Ordered in ED Medications - No data to display  ED Course/ Medical Decision Making/ A&P                             Medical Decision Making Patient here for evaluation of pain redne swelling of the lower leg.  Seen in urgent care on 03/12/2023 for similar symptoms.  Has been taking cephalexin without improvement.  Had venous ultrasound of the extremity on 03/12/23 that was negative for DVT   Differential would include but not limited to persistent cellulitis, abscess, vasculitis, venous insufficiency, DVT, osteomyelitis  Amount and/or Complexity of Data Reviewed Labs: ordered.    Details: Labs unremarkable Radiology: ordered.    Details: Tib/fib XR neg for acute bony process Discussion of management or test interpretation with external provider(s): Pt with likely cellulitis, failing cephalexin.  No clinical findings of systemic process recently had neg DVT study. Extremity is NV intact. Denies worsening sx's I do not feel there is indication for repeat US at this time.  Will have him switch to Augmentin.  Will f/u out pt with PCP  Risk Prescription drug management.           Final Clinical Impression(s) / ED Diagnoses Final diagnoses:  Cellulitis of left lower extremity    Rx / DC Orders ED Discharge Orders     None         Rosey Bath 03/20/23 2227     Benjiman Core, MD 03/22/23 [phone]

## 2023-03-21 ENCOUNTER — Telehealth: Payer: Self-pay | Admitting: *Deleted

## 2023-03-21 NOTE — Telephone Encounter (Signed)
        Patient  visited William Sandoval on 03/19/2023  for treatment    Telephone encounter attempt :1st  voicemail full   William Sandoval -[facility] [place], Population [facility] [phone] [address] [place] , [place] [address] Email : [email or web address]

## 2023-03-24 ENCOUNTER — Telehealth: Payer: Self-pay | Admitting: *Deleted

## 2023-03-24 NOTE — Telephone Encounter (Signed)
     Patient  visited [facility] on 03/19/2023  for treatment      Telephone encounter attempt :2nd  No answer   Alois Cliche -[facility], Population Health [phone] [address] [place] , [place] [address] Email : [email or web address]

## 2023-03-28 DIAGNOSIS — L03116 Cellulitis of left lower limb: Secondary | ICD-10-CM | POA: Diagnosis not present

## 2023-03-28 DIAGNOSIS — I10 Essential (primary) hypertension: Secondary | ICD-10-CM | POA: Diagnosis not present

## 2023-03-28 DIAGNOSIS — Z79899 Other long term (current) drug therapy: Secondary | ICD-10-CM | POA: Diagnosis not present

## 2023-03-28 DIAGNOSIS — D6859 Other primary thrombophilia: Secondary | ICD-10-CM | POA: Diagnosis not present

## 2023-03-28 DIAGNOSIS — Z6825 Body mass index (BMI) 25.0-25.9, adult: Secondary | ICD-10-CM | POA: Diagnosis not present

## 2023-03-28 DIAGNOSIS — Z87891 Personal history of nicotine dependence: Secondary | ICD-10-CM | POA: Diagnosis not present

## 2023-03-28 DIAGNOSIS — Z713 Dietary counseling and surveillance: Secondary | ICD-10-CM | POA: Diagnosis not present

## 2023-03-28 DIAGNOSIS — D6869 Other thrombophilia: Secondary | ICD-10-CM | POA: Diagnosis not present

## 2023-03-28 DIAGNOSIS — I4891 Unspecified atrial fibrillation: Secondary | ICD-10-CM | POA: Diagnosis not present

## 2023-04-07 DIAGNOSIS — E538 Deficiency of other specified B group vitamins: Secondary | ICD-10-CM | POA: Diagnosis not present

## 2023-04-07 DIAGNOSIS — E782 Mixed hyperlipidemia: Secondary | ICD-10-CM | POA: Diagnosis not present

## 2023-04-07 DIAGNOSIS — E039 Hypothyroidism, unspecified: Secondary | ICD-10-CM | POA: Diagnosis not present

## 2023-04-11 DIAGNOSIS — I10 Essential (primary) hypertension: Secondary | ICD-10-CM | POA: Diagnosis not present

## 2023-04-11 DIAGNOSIS — R945 Abnormal results of liver function studies: Secondary | ICD-10-CM | POA: Diagnosis not present

## 2023-04-11 DIAGNOSIS — D509 Iron deficiency anemia, unspecified: Secondary | ICD-10-CM | POA: Diagnosis not present

## 2023-04-11 DIAGNOSIS — E782 Mixed hyperlipidemia: Secondary | ICD-10-CM | POA: Diagnosis not present

## 2023-04-11 DIAGNOSIS — S82832D Other fracture of upper and lower end of left fibula, subsequent encounter for closed fracture with routine healing: Secondary | ICD-10-CM | POA: Diagnosis not present

## 2023-04-11 DIAGNOSIS — D6869 Other thrombophilia: Secondary | ICD-10-CM | POA: Diagnosis not present

## 2023-04-11 DIAGNOSIS — L03116 Cellulitis of left lower limb: Secondary | ICD-10-CM | POA: Diagnosis not present

## 2023-04-11 DIAGNOSIS — I482 Chronic atrial fibrillation, unspecified: Secondary | ICD-10-CM | POA: Diagnosis not present

## 2023-04-11 DIAGNOSIS — E538 Deficiency of other specified B group vitamins: Secondary | ICD-10-CM | POA: Diagnosis not present

## 2023-05-09 DIAGNOSIS — Z79890 Hormone replacement therapy (postmenopausal): Secondary | ICD-10-CM | POA: Diagnosis not present

## 2023-05-09 DIAGNOSIS — E538 Deficiency of other specified B group vitamins: Secondary | ICD-10-CM | POA: Diagnosis not present

## 2023-05-09 DIAGNOSIS — R6884 Jaw pain: Secondary | ICD-10-CM | POA: Diagnosis not present

## 2023-05-09 DIAGNOSIS — Z713 Dietary counseling and surveillance: Secondary | ICD-10-CM | POA: Diagnosis not present

## 2023-05-09 DIAGNOSIS — R0781 Pleurodynia: Secondary | ICD-10-CM | POA: Diagnosis not present

## 2023-05-09 DIAGNOSIS — I482 Chronic atrial fibrillation, unspecified: Secondary | ICD-10-CM | POA: Diagnosis not present

## 2023-05-09 DIAGNOSIS — E039 Hypothyroidism, unspecified: Secondary | ICD-10-CM | POA: Diagnosis not present

## 2023-05-09 DIAGNOSIS — I10 Essential (primary) hypertension: Secondary | ICD-10-CM | POA: Diagnosis not present

## 2023-05-09 DIAGNOSIS — D6869 Other thrombophilia: Secondary | ICD-10-CM | POA: Diagnosis not present

## 2023-06-09 DIAGNOSIS — I482 Chronic atrial fibrillation, unspecified: Secondary | ICD-10-CM | POA: Diagnosis not present

## 2023-06-09 DIAGNOSIS — D6869 Other thrombophilia: Secondary | ICD-10-CM | POA: Diagnosis not present

## 2023-06-09 DIAGNOSIS — I10 Essential (primary) hypertension: Secondary | ICD-10-CM | POA: Diagnosis not present

## 2023-06-09 DIAGNOSIS — Z79899 Other long term (current) drug therapy: Secondary | ICD-10-CM | POA: Diagnosis not present

## 2023-06-09 DIAGNOSIS — E538 Deficiency of other specified B group vitamins: Secondary | ICD-10-CM | POA: Diagnosis not present

## 2023-06-09 DIAGNOSIS — Z87891 Personal history of nicotine dependence: Secondary | ICD-10-CM | POA: Diagnosis not present

## 2023-06-09 DIAGNOSIS — E039 Hypothyroidism, unspecified: Secondary | ICD-10-CM | POA: Diagnosis not present

## 2023-07-07 DIAGNOSIS — I10 Essential (primary) hypertension: Secondary | ICD-10-CM | POA: Diagnosis not present

## 2023-07-07 DIAGNOSIS — I482 Chronic atrial fibrillation, unspecified: Secondary | ICD-10-CM | POA: Diagnosis not present

## 2023-07-07 DIAGNOSIS — Z7901 Long term (current) use of anticoagulants: Secondary | ICD-10-CM | POA: Diagnosis not present

## 2023-07-07 DIAGNOSIS — D6869 Other thrombophilia: Secondary | ICD-10-CM | POA: Diagnosis not present

## 2023-07-07 DIAGNOSIS — Z87891 Personal history of nicotine dependence: Secondary | ICD-10-CM | POA: Diagnosis not present

## 2023-07-07 DIAGNOSIS — Z713 Dietary counseling and surveillance: Secondary | ICD-10-CM | POA: Diagnosis not present

## 2023-07-07 DIAGNOSIS — E538 Deficiency of other specified B group vitamins: Secondary | ICD-10-CM | POA: Diagnosis not present

## 2023-07-07 DIAGNOSIS — Z6826 Body mass index (BMI) 26.0-26.9, adult: Secondary | ICD-10-CM | POA: Diagnosis not present

## 2023-07-07 DIAGNOSIS — Z79899 Other long term (current) drug therapy: Secondary | ICD-10-CM | POA: Diagnosis not present

## 2023-08-05 DIAGNOSIS — E538 Deficiency of other specified B group vitamins: Secondary | ICD-10-CM | POA: Diagnosis not present

## 2023-08-05 DIAGNOSIS — Z79899 Other long term (current) drug therapy: Secondary | ICD-10-CM | POA: Diagnosis not present

## 2023-08-05 DIAGNOSIS — D6869 Other thrombophilia: Secondary | ICD-10-CM | POA: Diagnosis not present

## 2023-08-05 DIAGNOSIS — Z87891 Personal history of nicotine dependence: Secondary | ICD-10-CM | POA: Diagnosis not present

## 2023-08-05 DIAGNOSIS — I10 Essential (primary) hypertension: Secondary | ICD-10-CM | POA: Diagnosis not present

## 2023-08-05 DIAGNOSIS — Z713 Dietary counseling and surveillance: Secondary | ICD-10-CM | POA: Diagnosis not present

## 2023-08-05 DIAGNOSIS — I482 Chronic atrial fibrillation, unspecified: Secondary | ICD-10-CM | POA: Diagnosis not present

## 2023-09-02 DIAGNOSIS — E039 Hypothyroidism, unspecified: Secondary | ICD-10-CM | POA: Diagnosis not present

## 2023-09-02 DIAGNOSIS — I482 Chronic atrial fibrillation, unspecified: Secondary | ICD-10-CM | POA: Diagnosis not present

## 2023-09-02 DIAGNOSIS — E538 Deficiency of other specified B group vitamins: Secondary | ICD-10-CM | POA: Diagnosis not present

## 2023-09-02 DIAGNOSIS — I10 Essential (primary) hypertension: Secondary | ICD-10-CM | POA: Diagnosis not present

## 2023-09-02 DIAGNOSIS — D6869 Other thrombophilia: Secondary | ICD-10-CM | POA: Diagnosis not present

## 2023-10-21 DIAGNOSIS — E782 Mixed hyperlipidemia: Secondary | ICD-10-CM | POA: Diagnosis not present

## 2023-10-21 DIAGNOSIS — E538 Deficiency of other specified B group vitamins: Secondary | ICD-10-CM | POA: Diagnosis not present

## 2023-10-21 DIAGNOSIS — E039 Hypothyroidism, unspecified: Secondary | ICD-10-CM | POA: Diagnosis not present

## 2023-10-27 DIAGNOSIS — D6869 Other thrombophilia: Secondary | ICD-10-CM | POA: Diagnosis not present

## 2023-10-27 DIAGNOSIS — E782 Mixed hyperlipidemia: Secondary | ICD-10-CM | POA: Diagnosis not present

## 2023-10-27 DIAGNOSIS — D509 Iron deficiency anemia, unspecified: Secondary | ICD-10-CM | POA: Diagnosis not present

## 2023-10-27 DIAGNOSIS — I10 Essential (primary) hypertension: Secondary | ICD-10-CM | POA: Diagnosis not present

## 2023-10-27 DIAGNOSIS — I482 Chronic atrial fibrillation, unspecified: Secondary | ICD-10-CM | POA: Diagnosis not present

## 2023-10-27 DIAGNOSIS — K219 Gastro-esophageal reflux disease without esophagitis: Secondary | ICD-10-CM | POA: Diagnosis not present

## 2023-10-27 DIAGNOSIS — E538 Deficiency of other specified B group vitamins: Secondary | ICD-10-CM | POA: Diagnosis not present

## 2023-10-27 DIAGNOSIS — H9191 Unspecified hearing loss, right ear: Secondary | ICD-10-CM | POA: Diagnosis not present

## 2023-10-27 DIAGNOSIS — E039 Hypothyroidism, unspecified: Secondary | ICD-10-CM | POA: Diagnosis not present

## 2023-11-24 DIAGNOSIS — D6869 Other thrombophilia: Secondary | ICD-10-CM | POA: Diagnosis not present

## 2023-11-24 DIAGNOSIS — I482 Chronic atrial fibrillation, unspecified: Secondary | ICD-10-CM | POA: Diagnosis not present

## 2023-11-24 DIAGNOSIS — E538 Deficiency of other specified B group vitamins: Secondary | ICD-10-CM | POA: Diagnosis not present

## 2023-11-24 DIAGNOSIS — I10 Essential (primary) hypertension: Secondary | ICD-10-CM | POA: Diagnosis not present

## 2023-11-24 DIAGNOSIS — Z87891 Personal history of nicotine dependence: Secondary | ICD-10-CM | POA: Diagnosis not present

## 2023-11-24 DIAGNOSIS — E039 Hypothyroidism, unspecified: Secondary | ICD-10-CM | POA: Diagnosis not present

## 2023-12-25 DIAGNOSIS — E538 Deficiency of other specified B group vitamins: Secondary | ICD-10-CM | POA: Diagnosis not present

## 2023-12-25 DIAGNOSIS — Z79899 Other long term (current) drug therapy: Secondary | ICD-10-CM | POA: Diagnosis not present

## 2023-12-25 DIAGNOSIS — I482 Chronic atrial fibrillation, unspecified: Secondary | ICD-10-CM | POA: Diagnosis not present

## 2023-12-25 DIAGNOSIS — Z7901 Long term (current) use of anticoagulants: Secondary | ICD-10-CM | POA: Diagnosis not present

## 2023-12-25 DIAGNOSIS — Z6826 Body mass index (BMI) 26.0-26.9, adult: Secondary | ICD-10-CM | POA: Diagnosis not present

## 2023-12-25 DIAGNOSIS — Z87891 Personal history of nicotine dependence: Secondary | ICD-10-CM | POA: Diagnosis not present

## 2023-12-25 DIAGNOSIS — I10 Essential (primary) hypertension: Secondary | ICD-10-CM | POA: Diagnosis not present

## 2023-12-25 DIAGNOSIS — Z713 Dietary counseling and surveillance: Secondary | ICD-10-CM | POA: Diagnosis not present

## 2024-01-23 DIAGNOSIS — E039 Hypothyroidism, unspecified: Secondary | ICD-10-CM | POA: Diagnosis not present

## 2024-01-23 DIAGNOSIS — I10 Essential (primary) hypertension: Secondary | ICD-10-CM | POA: Diagnosis not present

## 2024-01-23 DIAGNOSIS — I482 Chronic atrial fibrillation, unspecified: Secondary | ICD-10-CM | POA: Diagnosis not present

## 2024-01-23 DIAGNOSIS — E538 Deficiency of other specified B group vitamins: Secondary | ICD-10-CM | POA: Diagnosis not present

## 2024-02-20 DIAGNOSIS — Z6826 Body mass index (BMI) 26.0-26.9, adult: Secondary | ICD-10-CM | POA: Diagnosis not present

## 2024-02-20 DIAGNOSIS — Z87891 Personal history of nicotine dependence: Secondary | ICD-10-CM | POA: Diagnosis not present

## 2024-02-20 DIAGNOSIS — Z713 Dietary counseling and surveillance: Secondary | ICD-10-CM | POA: Diagnosis not present

## 2024-02-20 DIAGNOSIS — I482 Chronic atrial fibrillation, unspecified: Secondary | ICD-10-CM | POA: Diagnosis not present

## 2024-02-20 DIAGNOSIS — I10 Essential (primary) hypertension: Secondary | ICD-10-CM | POA: Diagnosis not present

## 2024-02-20 DIAGNOSIS — E538 Deficiency of other specified B group vitamins: Secondary | ICD-10-CM | POA: Diagnosis not present

## 2024-02-21 IMAGING — US US ABDOMEN LIMITED
1 series · 14 of 25 positions shown · non-contrast
Comparison: Abdominal ultrasound 08/15/2015

CLINICAL DATA: Elevated alkaline phosphatase

EXAM:
ULTRASOUND ABDOMEN LIMITED RIGHT UPPER QUADRANT

[Series 1: us abdomen limited ruq (liver/gb) · 14 of 78 slices shown]
[im 1/78]
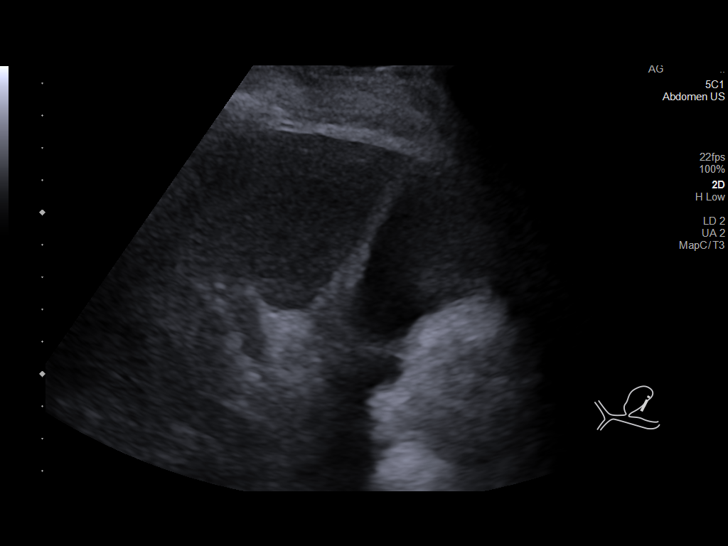
[im 7/78]
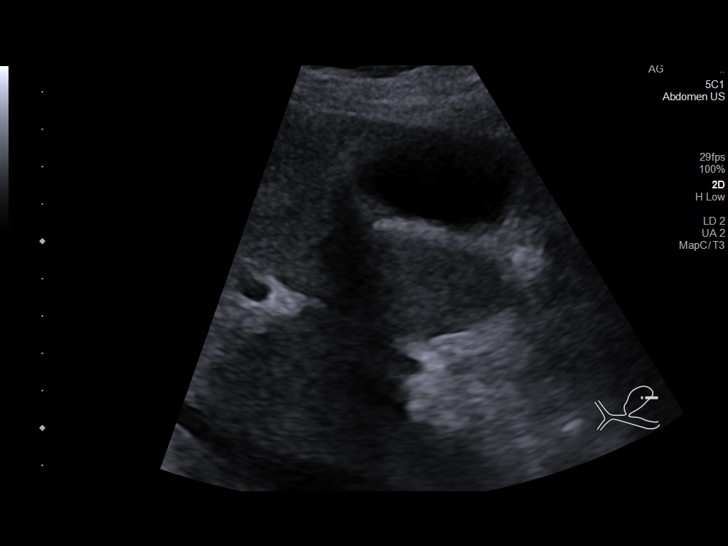
[im 13/78]
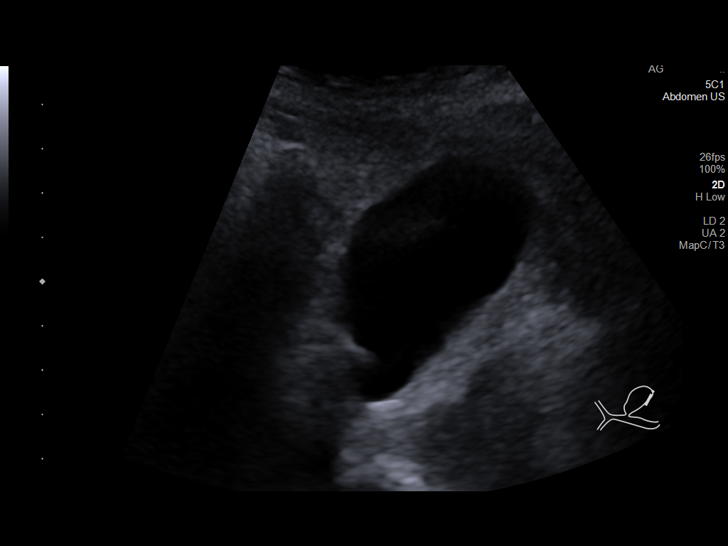
[im 20/78]
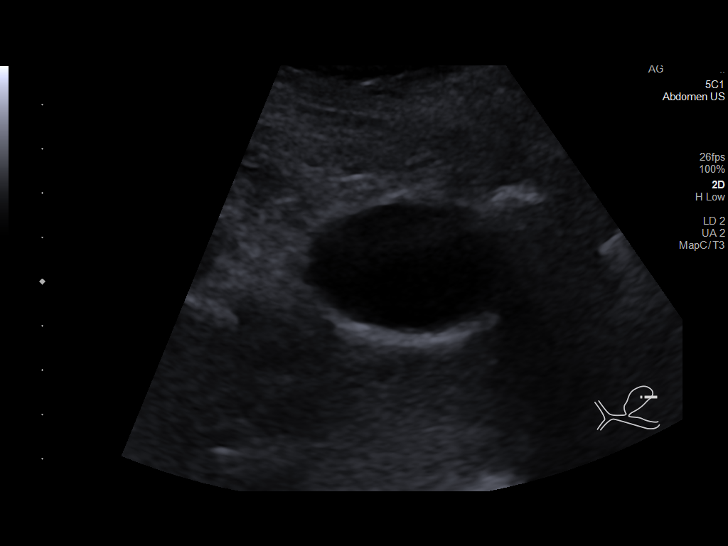
[im 26/78]
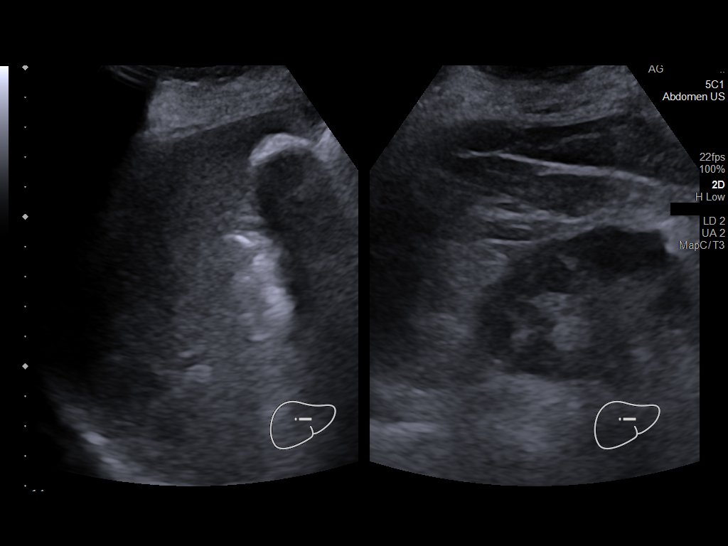
[im 29/78]
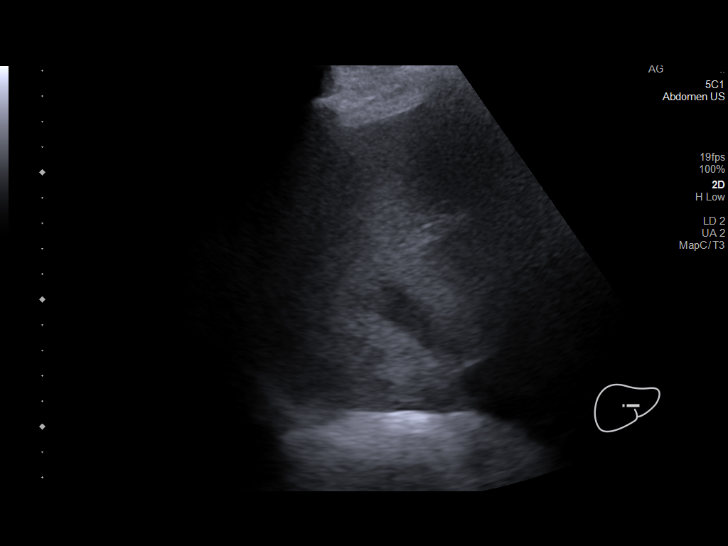
[im 36/78]
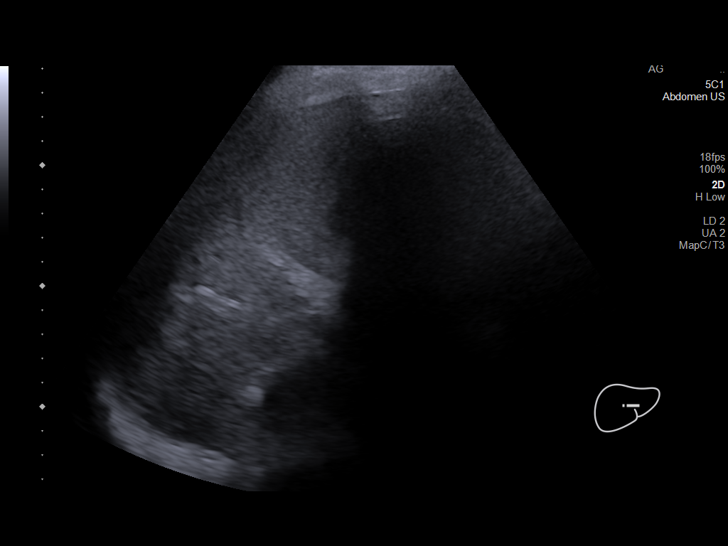
[im 42/78]
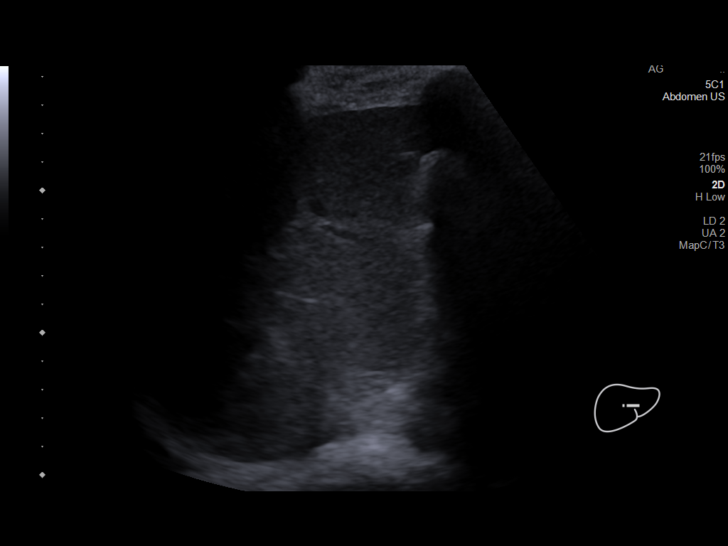
[im 49/78]
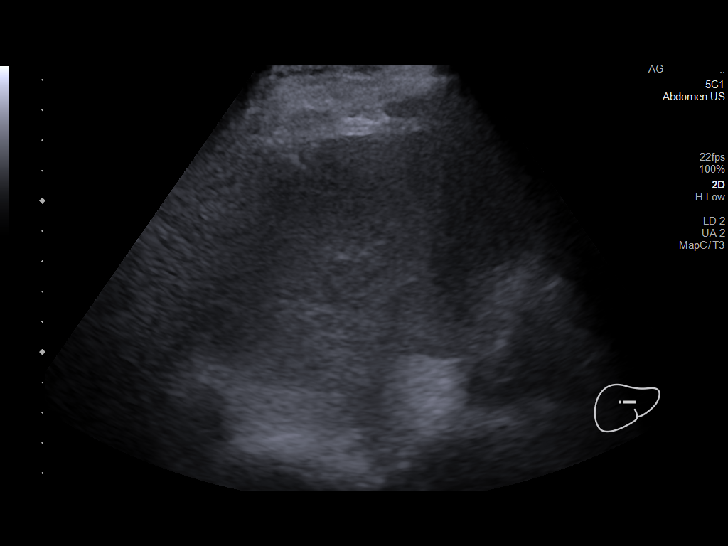
[im 52/78]
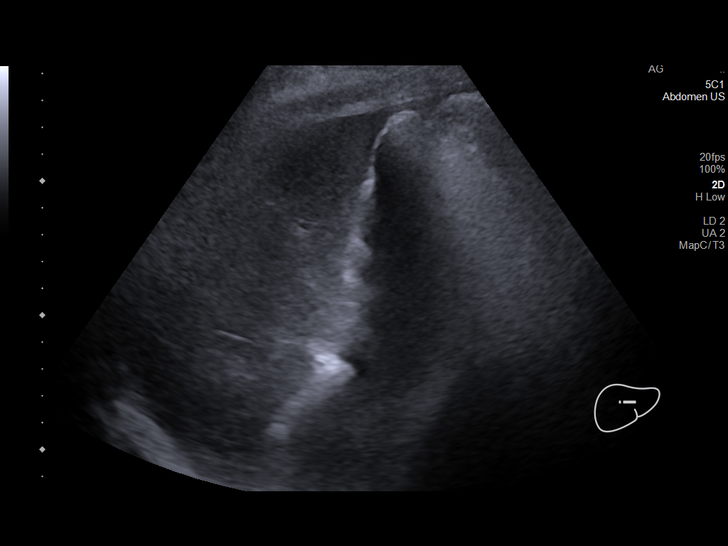
[im 58/78]
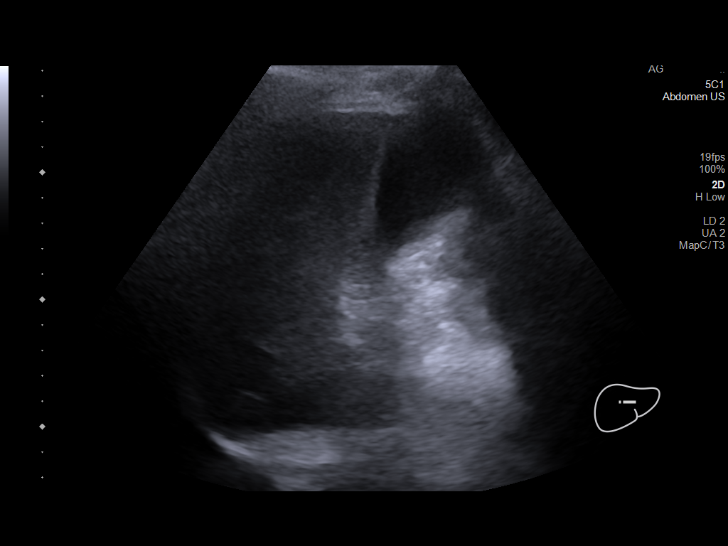
[im 65/78]
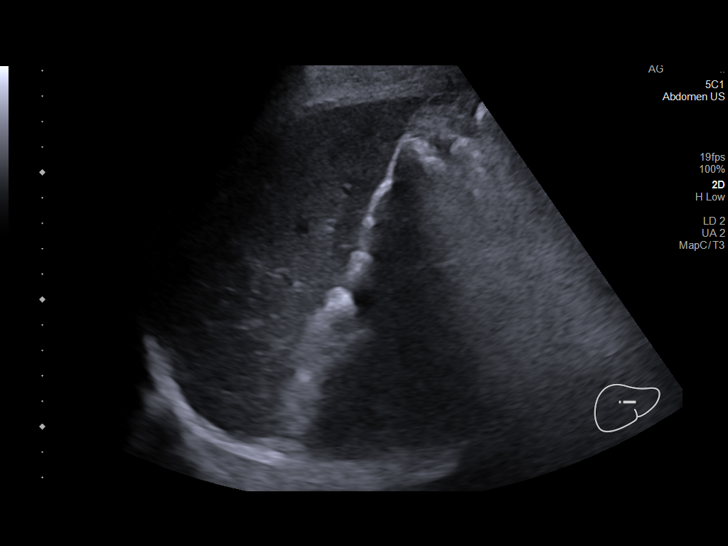
[im 71/78]
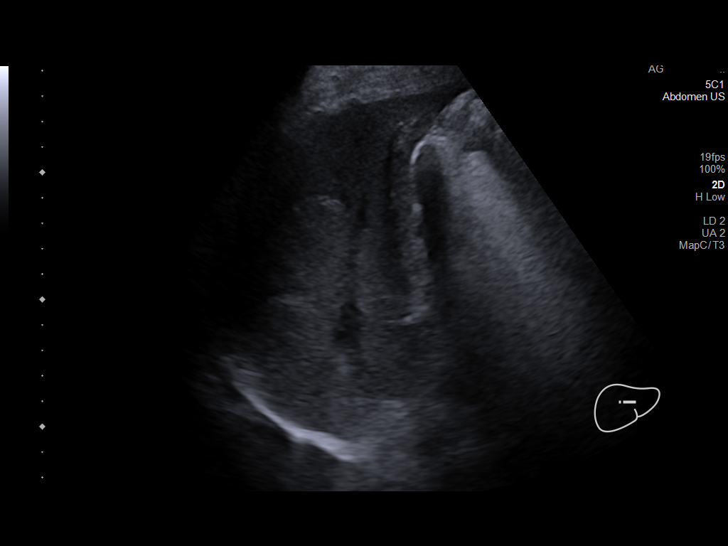
[im 78/78]
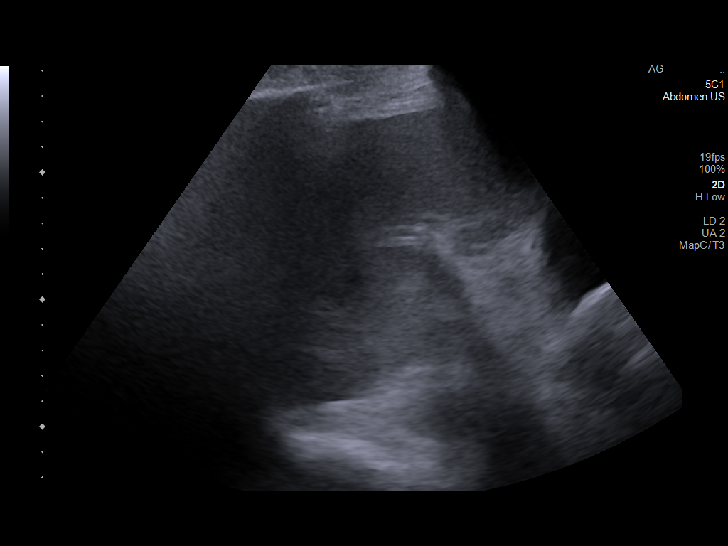

[14 of 25 positions shown; findings below may reference images not displayed]

FINDINGS: Gallbladder:

No gallstones or wall thickening visualized. No sonographic Murphy
sign noted by sonographer.

Common bile duct:

Diameter: 3 mm

Liver:

Mildly increased echogenicity of the parenchyma with no focal mass
visualized. Portal vein is patent on color Doppler imaging with
normal direction of blood flow towards the liver.

Other: None.
IMPRESSION: Mildly increased echogenicity of the liver parenchyma which may
represent hepatic steatosis and/or other hepatocellular disease.

## 2024-04-14 DIAGNOSIS — E039 Hypothyroidism, unspecified: Secondary | ICD-10-CM | POA: Diagnosis not present

## 2024-04-14 DIAGNOSIS — E538 Deficiency of other specified B group vitamins: Secondary | ICD-10-CM | POA: Diagnosis not present

## 2024-04-14 DIAGNOSIS — E782 Mixed hyperlipidemia: Secondary | ICD-10-CM | POA: Diagnosis not present

## 2024-04-20 DIAGNOSIS — D509 Iron deficiency anemia, unspecified: Secondary | ICD-10-CM | POA: Diagnosis not present

## 2024-04-20 DIAGNOSIS — E538 Deficiency of other specified B group vitamins: Secondary | ICD-10-CM | POA: Diagnosis not present

## 2024-04-20 DIAGNOSIS — E782 Mixed hyperlipidemia: Secondary | ICD-10-CM | POA: Diagnosis not present

## 2024-04-20 DIAGNOSIS — Z7901 Long term (current) use of anticoagulants: Secondary | ICD-10-CM | POA: Diagnosis not present

## 2024-04-20 DIAGNOSIS — E039 Hypothyroidism, unspecified: Secondary | ICD-10-CM | POA: Diagnosis not present

## 2024-04-20 DIAGNOSIS — D6869 Other thrombophilia: Secondary | ICD-10-CM | POA: Diagnosis not present

## 2024-04-20 DIAGNOSIS — I482 Chronic atrial fibrillation, unspecified: Secondary | ICD-10-CM | POA: Diagnosis not present

## 2024-04-20 DIAGNOSIS — R945 Abnormal results of liver function studies: Secondary | ICD-10-CM | POA: Diagnosis not present

## 2024-04-20 DIAGNOSIS — I10 Essential (primary) hypertension: Secondary | ICD-10-CM | POA: Diagnosis not present

## 2024-05-18 ENCOUNTER — Other Ambulatory Visit (HOSPITAL_COMMUNITY): Payer: Self-pay | Admitting: Internal Medicine

## 2024-05-18 DIAGNOSIS — E782 Mixed hyperlipidemia: Secondary | ICD-10-CM | POA: Diagnosis not present

## 2024-05-18 DIAGNOSIS — I482 Chronic atrial fibrillation, unspecified: Secondary | ICD-10-CM | POA: Diagnosis not present

## 2024-05-18 DIAGNOSIS — I10 Essential (primary) hypertension: Secondary | ICD-10-CM | POA: Diagnosis not present

## 2024-05-18 DIAGNOSIS — I739 Peripheral vascular disease, unspecified: Secondary | ICD-10-CM | POA: Diagnosis not present

## 2024-05-18 DIAGNOSIS — Z7901 Long term (current) use of anticoagulants: Secondary | ICD-10-CM | POA: Diagnosis not present

## 2024-05-18 DIAGNOSIS — R55 Syncope and collapse: Secondary | ICD-10-CM | POA: Diagnosis not present

## 2024-05-18 DIAGNOSIS — Z79899 Other long term (current) drug therapy: Secondary | ICD-10-CM | POA: Diagnosis not present

## 2024-05-18 DIAGNOSIS — Z87891 Personal history of nicotine dependence: Secondary | ICD-10-CM | POA: Diagnosis not present

## 2024-05-18 DIAGNOSIS — E538 Deficiency of other specified B group vitamins: Secondary | ICD-10-CM | POA: Diagnosis not present

## 2024-05-25 ENCOUNTER — Ambulatory Visit (HOSPITAL_COMMUNITY)
Admission: RE | Admit: 2024-05-25 | Discharge: 2024-05-25 | Disposition: A | Discharge status: Home / Self Care | Source: Ambulatory Visit | Attending: Internal Medicine | Admitting: Internal Medicine

## 2024-05-25 DIAGNOSIS — I6523 Occlusion and stenosis of bilateral carotid arteries: Secondary | ICD-10-CM | POA: Diagnosis not present

## 2024-05-25 DIAGNOSIS — R55 Syncope and collapse: Secondary | ICD-10-CM | POA: Insufficient documentation

## 2024-06-16 DIAGNOSIS — I482 Chronic atrial fibrillation, unspecified: Secondary | ICD-10-CM | POA: Diagnosis not present

## 2024-06-16 DIAGNOSIS — R55 Syncope and collapse: Secondary | ICD-10-CM | POA: Diagnosis not present

## 2024-06-16 DIAGNOSIS — E538 Deficiency of other specified B group vitamins: Secondary | ICD-10-CM | POA: Diagnosis not present

## 2024-06-30 DIAGNOSIS — I10 Essential (primary) hypertension: Secondary | ICD-10-CM | POA: Diagnosis not present

## 2024-06-30 DIAGNOSIS — I482 Chronic atrial fibrillation, unspecified: Secondary | ICD-10-CM | POA: Diagnosis not present

## 2024-06-30 DIAGNOSIS — Z7182 Exercise counseling: Secondary | ICD-10-CM | POA: Diagnosis not present

## 2024-06-30 DIAGNOSIS — E538 Deficiency of other specified B group vitamins: Secondary | ICD-10-CM | POA: Diagnosis not present

## 2024-06-30 DIAGNOSIS — Z713 Dietary counseling and surveillance: Secondary | ICD-10-CM | POA: Diagnosis not present

## 2024-07-14 DIAGNOSIS — Z79899 Other long term (current) drug therapy: Secondary | ICD-10-CM | POA: Diagnosis not present

## 2024-07-14 DIAGNOSIS — Z87891 Personal history of nicotine dependence: Secondary | ICD-10-CM | POA: Diagnosis not present

## 2024-07-14 DIAGNOSIS — Z713 Dietary counseling and surveillance: Secondary | ICD-10-CM | POA: Diagnosis not present

## 2024-07-14 DIAGNOSIS — E538 Deficiency of other specified B group vitamins: Secondary | ICD-10-CM | POA: Diagnosis not present

## 2024-07-14 DIAGNOSIS — I10 Essential (primary) hypertension: Secondary | ICD-10-CM | POA: Diagnosis not present

## 2024-07-14 DIAGNOSIS — I482 Chronic atrial fibrillation, unspecified: Secondary | ICD-10-CM | POA: Diagnosis not present

## 2024-08-11 DIAGNOSIS — E039 Hypothyroidism, unspecified: Secondary | ICD-10-CM | POA: Diagnosis not present

## 2024-08-11 DIAGNOSIS — E538 Deficiency of other specified B group vitamins: Secondary | ICD-10-CM | POA: Diagnosis not present

## 2024-08-11 DIAGNOSIS — I10 Essential (primary) hypertension: Secondary | ICD-10-CM | POA: Diagnosis not present

## 2024-08-11 DIAGNOSIS — Z23 Encounter for immunization: Secondary | ICD-10-CM | POA: Diagnosis not present

## 2024-08-11 DIAGNOSIS — I482 Chronic atrial fibrillation, unspecified: Secondary | ICD-10-CM | POA: Diagnosis not present

## 2024-08-11 DIAGNOSIS — Z79899 Other long term (current) drug therapy: Secondary | ICD-10-CM | POA: Diagnosis not present

## 2024-08-11 DIAGNOSIS — Z79890 Hormone replacement therapy (postmenopausal): Secondary | ICD-10-CM | POA: Diagnosis not present

## 2024-08-11 DIAGNOSIS — Z713 Dietary counseling and surveillance: Secondary | ICD-10-CM | POA: Diagnosis not present

## 2024-08-11 DIAGNOSIS — Z6826 Body mass index (BMI) 26.0-26.9, adult: Secondary | ICD-10-CM | POA: Diagnosis not present

## 2024-09-08 DIAGNOSIS — E039 Hypothyroidism, unspecified: Secondary | ICD-10-CM | POA: Diagnosis not present

## 2024-09-08 DIAGNOSIS — E538 Deficiency of other specified B group vitamins: Secondary | ICD-10-CM | POA: Diagnosis not present

## 2024-09-08 DIAGNOSIS — Z0000 Encounter for general adult medical examination without abnormal findings: Secondary | ICD-10-CM | POA: Diagnosis not present

## 2024-09-08 DIAGNOSIS — I10 Essential (primary) hypertension: Secondary | ICD-10-CM | POA: Diagnosis not present

## 2024-09-08 DIAGNOSIS — I739 Peripheral vascular disease, unspecified: Secondary | ICD-10-CM | POA: Diagnosis not present

## 2024-09-08 DIAGNOSIS — I482 Chronic atrial fibrillation, unspecified: Secondary | ICD-10-CM | POA: Diagnosis not present

## 2024-09-08 DIAGNOSIS — M79652 Pain in left thigh: Secondary | ICD-10-CM | POA: Diagnosis not present

## 2024-09-08 DIAGNOSIS — E782 Mixed hyperlipidemia: Secondary | ICD-10-CM | POA: Diagnosis not present

## 2024-09-08 DIAGNOSIS — Z0001 Encounter for general adult medical examination with abnormal findings: Secondary | ICD-10-CM | POA: Diagnosis not present

## 2024-09-22 DIAGNOSIS — I10 Essential (primary) hypertension: Secondary | ICD-10-CM | POA: Diagnosis not present

## 2024-09-22 DIAGNOSIS — Z79899 Other long term (current) drug therapy: Secondary | ICD-10-CM | POA: Diagnosis not present

## 2024-09-22 DIAGNOSIS — Z6826 Body mass index (BMI) 26.0-26.9, adult: Secondary | ICD-10-CM | POA: Diagnosis not present

## 2024-09-22 DIAGNOSIS — Z713 Dietary counseling and surveillance: Secondary | ICD-10-CM | POA: Diagnosis not present

## 2024-09-22 DIAGNOSIS — Z87891 Personal history of nicotine dependence: Secondary | ICD-10-CM | POA: Diagnosis not present

## 2024-09-22 DIAGNOSIS — I482 Chronic atrial fibrillation, unspecified: Secondary | ICD-10-CM | POA: Diagnosis not present

## 2024-10-15 DIAGNOSIS — E538 Deficiency of other specified B group vitamins: Secondary | ICD-10-CM | POA: Diagnosis not present

## 2024-10-15 DIAGNOSIS — E039 Hypothyroidism, unspecified: Secondary | ICD-10-CM | POA: Diagnosis not present

## 2024-10-21 DIAGNOSIS — E782 Mixed hyperlipidemia: Secondary | ICD-10-CM | POA: Diagnosis not present

## 2024-10-21 DIAGNOSIS — I10 Essential (primary) hypertension: Secondary | ICD-10-CM | POA: Diagnosis not present

## 2024-10-21 DIAGNOSIS — E538 Deficiency of other specified B group vitamins: Secondary | ICD-10-CM | POA: Diagnosis not present

## 2024-10-21 DIAGNOSIS — I482 Chronic atrial fibrillation, unspecified: Secondary | ICD-10-CM | POA: Diagnosis not present

## 2024-10-21 DIAGNOSIS — M79652 Pain in left thigh: Secondary | ICD-10-CM | POA: Diagnosis not present

## 2024-10-21 DIAGNOSIS — D6869 Other thrombophilia: Secondary | ICD-10-CM | POA: Diagnosis not present

## 2024-10-21 DIAGNOSIS — I739 Peripheral vascular disease, unspecified: Secondary | ICD-10-CM | POA: Diagnosis not present

## 2024-10-21 DIAGNOSIS — Z7901 Long term (current) use of anticoagulants: Secondary | ICD-10-CM | POA: Diagnosis not present

## 2024-10-21 DIAGNOSIS — E039 Hypothyroidism, unspecified: Secondary | ICD-10-CM | POA: Diagnosis not present
